# Patient Record
Sex: Male | Born: 1950
Health system: Southern US, Community
[De-identification: ages and names within clinical notes are randomized; demographics above are authoritative.]

## PROBLEM LIST (undated history)

## (undated) DIAGNOSIS — G43909 Migraine, unspecified, not intractable, without status migrainosus: Secondary | ICD-10-CM

## (undated) DIAGNOSIS — K859 Acute pancreatitis without necrosis or infection, unspecified: Secondary | ICD-10-CM

## (undated) DIAGNOSIS — I2510 Atherosclerotic heart disease of native coronary artery without angina pectoris: Secondary | ICD-10-CM

## (undated) DIAGNOSIS — G4730 Sleep apnea, unspecified: Secondary | ICD-10-CM

## (undated) DIAGNOSIS — K2270 Barrett's esophagus without dysplasia: Secondary | ICD-10-CM

## (undated) DIAGNOSIS — N200 Calculus of kidney: Secondary | ICD-10-CM

## (undated) DIAGNOSIS — E785 Hyperlipidemia, unspecified: Secondary | ICD-10-CM

## (undated) DIAGNOSIS — R519 Headache, unspecified: Secondary | ICD-10-CM

## (undated) DIAGNOSIS — R51 Headache: Secondary | ICD-10-CM

## (undated) DIAGNOSIS — G709 Myoneural disorder, unspecified: Secondary | ICD-10-CM

## (undated) DIAGNOSIS — Z87442 Personal history of urinary calculi: Secondary | ICD-10-CM

## (undated) DIAGNOSIS — J189 Pneumonia, unspecified organism: Secondary | ICD-10-CM

## (undated) DIAGNOSIS — N400 Enlarged prostate without lower urinary tract symptoms: Secondary | ICD-10-CM

## (undated) DIAGNOSIS — Z8719 Personal history of other diseases of the digestive system: Secondary | ICD-10-CM

## (undated) DIAGNOSIS — Z832 Family history of diseases of the blood and blood-forming organs and certain disorders involving the immune mechanism: Secondary | ICD-10-CM

## (undated) DIAGNOSIS — C61 Malignant neoplasm of prostate: Secondary | ICD-10-CM

## (undated) DIAGNOSIS — T8859XA Other complications of anesthesia, initial encounter: Secondary | ICD-10-CM

## (undated) DIAGNOSIS — IMO0002 Reserved for concepts with insufficient information to code with codable children: Secondary | ICD-10-CM

## (undated) DIAGNOSIS — I10 Essential (primary) hypertension: Secondary | ICD-10-CM

## (undated) DIAGNOSIS — K219 Gastro-esophageal reflux disease without esophagitis: Secondary | ICD-10-CM

## (undated) DIAGNOSIS — K635 Polyp of colon: Secondary | ICD-10-CM

## (undated) HISTORY — DX: Hyperlipidemia, unspecified: E78.5

## (undated) HISTORY — DX: Headache, unspecified: R51.9

## (undated) HISTORY — DX: Gastro-esophageal reflux disease without esophagitis: K21.9

## (undated) HISTORY — DX: Essential (primary) hypertension: I10

## (undated) HISTORY — PX: PROSTATE BIOPSY: SHX241

## (undated) HISTORY — PX: EYE SURGERY: SHX253

## (undated) HISTORY — DX: Benign prostatic hyperplasia without lower urinary tract symptoms: N40.0

## (undated) HISTORY — DX: Headache: R51

## (undated) HISTORY — DX: Calculus of kidney: N20.0

## (undated) HISTORY — PX: NASAL ENDOSCOPY: SHX6577

## (undated) HISTORY — PX: OTHER SURGICAL HISTORY: SHX169

## (undated) HISTORY — PX: CORONARY ANGIOPLASTY: SHX604

## (undated) HISTORY — DX: Migraine, unspecified, not intractable, without status migrainosus: G43.909

## (undated) HISTORY — DX: Polyp of colon: K63.5

## (undated) HISTORY — DX: Acute pancreatitis without necrosis or infection, unspecified: K85.90

## (undated) HISTORY — DX: Reserved for concepts with insufficient information to code with codable children: IMO0002

---

## 2010-10-05 DIAGNOSIS — E7800 Pure hypercholesterolemia, unspecified: Secondary | ICD-10-CM | POA: Insufficient documentation

## 2010-10-05 DIAGNOSIS — G609 Hereditary and idiopathic neuropathy, unspecified: Secondary | ICD-10-CM | POA: Insufficient documentation

## 2010-10-05 DIAGNOSIS — G4733 Obstructive sleep apnea (adult) (pediatric): Secondary | ICD-10-CM | POA: Diagnosis present

## 2010-10-05 DIAGNOSIS — E559 Vitamin D deficiency, unspecified: Secondary | ICD-10-CM

## 2010-10-05 HISTORY — DX: Vitamin D deficiency, unspecified: E55.9

## 2010-10-05 HISTORY — DX: Hereditary and idiopathic neuropathy, unspecified: G60.9

## 2010-10-05 HISTORY — DX: Pure hypercholesterolemia, unspecified: E78.00

## 2011-07-13 DIAGNOSIS — Z8669 Personal history of other diseases of the nervous system and sense organs: Secondary | ICD-10-CM

## 2011-07-13 HISTORY — DX: Personal history of other diseases of the nervous system and sense organs: Z86.69

## 2011-11-07 DIAGNOSIS — E8881 Metabolic syndrome: Secondary | ICD-10-CM

## 2011-11-07 DIAGNOSIS — R001 Bradycardia, unspecified: Secondary | ICD-10-CM | POA: Insufficient documentation

## 2011-11-07 HISTORY — DX: Bradycardia, unspecified: R00.1

## 2011-11-07 HISTORY — DX: Metabolic syndrome: E88.810

## 2018-04-02 ENCOUNTER — Telehealth: Payer: Self-pay

## 2018-04-02 NOTE — Telephone Encounter (Signed)
Called pt and informed him that Dr. Larose Kells was not accepting a new pts. However, we have 2 providers that are. Pt decided to schedule with Dr. Nani Ravens on 04/20/18.

## 2018-04-02 NOTE — Telephone Encounter (Signed)
Copied from [place] [id number]. Topic: Appointment Scheduling - Scheduling Inquiry for Clinic >> Apr 02, 2018  9:53 AM Rutherford Nail, NT wrote: Reason for CRM: Calling and would like to know if Dr Larose Kells is willing to accept him as a new patient. Covered under [employer].  CB#: [phone]

## 2018-04-02 NOTE — Telephone Encounter (Signed)
Not taking new pts, other providers are

## 2018-04-02 NOTE — Telephone Encounter (Signed)
Please advise 

## 2018-04-02 NOTE — Telephone Encounter (Signed)
Please inform Pt that Dean Guzman is not accepting new patients at this time. Thank you.

## 2018-04-20 ENCOUNTER — Encounter: Payer: Self-pay | Admitting: Family Medicine

## 2018-04-20 ENCOUNTER — Ambulatory Visit (INDEPENDENT_AMBULATORY_CARE_PROVIDER_SITE_OTHER): Payer: Managed Care, Other (non HMO) | Admitting: Family Medicine

## 2018-04-20 VITALS — BP 132/70 | HR 67 | Temp 98.9°F | Ht 73.0 in | Wt 263.2 lb

## 2018-04-20 DIAGNOSIS — R252 Cramp and spasm: Secondary | ICD-10-CM

## 2018-04-20 DIAGNOSIS — R0982 Postnasal drip: Secondary | ICD-10-CM

## 2018-04-20 DIAGNOSIS — E785 Hyperlipidemia, unspecified: Secondary | ICD-10-CM

## 2018-04-20 DIAGNOSIS — R200 Anesthesia of skin: Secondary | ICD-10-CM

## 2018-04-20 DIAGNOSIS — I10 Essential (primary) hypertension: Secondary | ICD-10-CM

## 2018-04-20 HISTORY — DX: Essential (primary) hypertension: I10

## 2018-04-20 HISTORY — DX: Cramp and spasm: R25.2

## 2018-04-20 HISTORY — DX: Anesthesia of skin: R20.0

## 2018-04-20 LAB — BASIC METABOLIC PANEL
BUN: 21 mg/dL (ref 6–23)
CO2: 32 mEq/L (ref 19–32)
Calcium: 9.5 mg/dL (ref 8.4–10.5)
Chloride: 100 mEq/L (ref 96–112)
Creatinine, Ser: 1.06 mg/dL (ref 0.40–1.50)
GFR: 73.93 mL/min (ref >60.00)
Glucose, Bld: 109 mg/dL — ABNORMAL HIGH (ref 70–99)
Potassium: 4.3 mEq/L (ref 3.5–5.1)
Sodium: 140 mEq/L (ref 135–145)

## 2018-04-20 LAB — IBC PANEL
Iron: 74 ug/dL (ref 42–165)
Saturation Ratios: 20.2 % (ref 20.0–50.0)
Transferrin: 262 mg/dL (ref 212.0–360.0)

## 2018-04-20 LAB — MAGNESIUM: Magnesium: 1.8 mg/dL (ref 1.5–2.5)

## 2018-04-20 LAB — FERRITIN: Ferritin: 68.7 ng/mL (ref 22.0–322.0)

## 2018-04-20 LAB — VITAMIN B12: Vitamin B-12: 448 pg/mL (ref 211–911)

## 2018-04-20 MED ORDER — LEVOCETIRIZINE DIHYDROCHLORIDE 5 MG PO TABS: 5.0000 mg | ORAL_TABLET | Freq: Every evening | ORAL | 2 refills | Status: DC

## 2018-04-20 MED ORDER — SIMVASTATIN 20 MG PO TABS: 20.0000 mg | ORAL_TABLET | Freq: Every day | ORAL | 1 refills | Status: DC

## 2018-04-20 NOTE — Progress Notes (Signed)
Pre visit review using our clinic review tool, if applicable. No additional management support is needed unless otherwise documented below in the visit note. 

## 2018-04-20 NOTE — Progress Notes (Signed)
Chief Complaint  Patient presents with  . New Patient (Initial Visit)       New Patient Visit SUBJECTIVE: HPI: Dean Guzman is an 67 y.o.male who is being seen for establishing care.  The patient was previously seen at office in [place].  Hypertension Patient presents for hypertension follow up. He does monitor home blood pressures. Blood pressures ranging on average from 120-130's/70's. He is compliant with medications. Patient has these side effects of medication: none He is adhering to a healthy diet overall. Exercise: walking  Hyperlipidemia Patient presents for dyslipidemia follow up. Currently being treated with simvastatin 20 mg/d and compliance with treatment thus far has been good. He denies myalgias. He is adhering to a healthy. Exercise: Some walking The patient is not known to have coexisting coronary artery disease.  20 yrs of cramping in toes, usually when he goes to bed. Not every night. No weakness, no current issues. He is not having any skin changes. Some labs in past. No hx of Fe def. EMG/NCT showed mild neuropathy. Thyroid, sugar, and lytes normal. Never had Fe studies, B12, or Mg.   Hx of PND. Tx'd for allergies w Claritin, Flonase, and Singulair.  She tried it for 2 months and noticed no difference.  He moved to [place] and noticed he still had this issue.  He is not currently taking anything for it.  Not on File  Past Medical History:  Diagnosis Date  . BPH (benign prostatic hyperplasia)   . Colon polyps   . Frequent headaches   . GERD (gastroesophageal reflux disease)   . Hyperlipidemia   . Hypertension   . Migraines    History reviewed. No pertinent surgical history. Family History  Problem Relation Age of Onset  . Arthritis Mother   . Heart disease Father   . Hyperlipidemia Father   . Hypertension Father   . Cancer Sister        breast cancer  . Diabetes Maternal Grandmother   . Hearing loss Paternal Grandmother   . Early death  Paternal Grandmother   . Hearing loss Paternal Grandfather   . Early death Paternal Grandfather     Current Outpatient Medications:  .  aspirin EC 81 MG tablet, Take 81 mg by mouth daily., Disp: , Rfl:  .  cholecalciferol (VITAMIN D3) 25 MCG (1000 UT) tablet, Take 1,000 Units by mouth daily., Disp: , Rfl:  .  diltiazem (DILACOR XR) 240 MG 24 hr capsule, Take 240 mg by mouth daily., Disp: , Rfl:  .  hydrochlorothiazide (MICROZIDE) 12.5 MG capsule, Take 12.5 mg by mouth daily., Disp: , Rfl:  .  ipratropium (ATROVENT) 0.06 % nasal spray, Place 1 spray into both nostrils 2 (two) times daily., Disp: , Rfl:  .  lansoprazole (PREVACID) 15 MG capsule, Take 15 mg by mouth daily at 12 noon., Disp: , Rfl:  .  levocetirizine (XYZAL) 5 MG tablet, Take 1 tablet (5 mg total) by mouth every evening., Disp: 30 tablet, Rfl: 2 .  lisinopril (PRINIVIL,ZESTRIL) 10 MG tablet, Take 10 mg by mouth daily., Disp: , Rfl:  .  Multiple Vitamins-Minerals (CENTRUM SILVER PO), Take by mouth., Disp: , Rfl:  .  Omega-3 Fatty Acids (FISH OIL) 1200 MG CAPS, Take 1 capsule by mouth 2 (two) times daily., Disp: , Rfl:  .  simvastatin (ZOCOR) 20 MG tablet, Take 1 tablet (20 mg total) by mouth daily., Disp: 90 tablet, Rfl: 1 .  tadalafil (CIALIS) 5 MG tablet, Take 5 mg by mouth  daily as needed for erectile dysfunction., Disp: , Rfl:    ROS Cardiovascular: Denies chest pain  Respiratory: Denies dyspnea   OBJECTIVE: BP 132/70 (BP Location: Left Arm, Patient Position: Sitting, Cuff Size: Normal)   Pulse 67   Temp 98.9 F (37.2 C) (Oral)   Ht 6\' 1"  (1.854 m)   Wt 263 lb 4 oz (119.4 kg)   SpO2 97%   BMI 34.73 kg/m   Constitutional: -  VS reviewed -  Well developed, well nourished, appears stated age -  No apparent distress  Psychiatric: -  Oriented to person, place, and time -  Memory intact -  Affect and mood normal -  Fluent conversation, good eye contact -  Judgment and insight age appropriate  Eye: -  Conjunctivae  clear, no discharge -  Pupils symmetric, round, reactive to light  ENMT: -  MMM    Pharynx moist, no exudate, no erythema  Neck: -  No gross swelling, no palpable masses -  Thyroid midline, not enlarged, mobile, no palpable masses  Cardiovascular: -  RRR -  No LE edema  Respiratory: -  Normal respiratory effort, no accessory muscle use, no retraction -  Breath sounds equal, no wheezes, no ronchi, no crackles  Neurological:  -  CN II - XII grossly intact -Decreased sensation over the digits of the feet bilaterally to pinprick  Musculoskeletal: -  No clubbing, no cyanosis -  Gait normal  Skin: -  No significant lesion on inspection -  Warm and dry to palpation   ASSESSMENT/PLAN: Essential hypertension - Plan: hydrochlorothiazide (MICROZIDE) 12.5 MG capsule, lisinopril (PRINIVIL,ZESTRIL) 10 MG tablet  Hyperlipidemia, unspecified hyperlipidemia type - Plan: simvastatin (ZOCOR) 20 MG tablet  Post-nasal drainage - Plan: levocetirizine (XYZAL) 5 MG tablet  Numbness of toes - Plan: B12  Cramping of feet - Plan: Basic metabolic panel, Ferritin, IBC panel, Magnesium  Patient instructed to sign release of records form from his previous PCP. For a #1 and #2, continue current medications For #3, will try Xyzal.  If no improvement, will refer to allergy. We will check labs for a #4 #5.  If no abnormalities, will start Effexor. Patient should return in 6 weeks. The patient voiced understanding and agreement to the plan.   [place], DO 04/20/18  4:20 PM

## 2018-04-20 NOTE — Patient Instructions (Addendum)
Because your blood pressure is well-controlled, you no longer have to check your blood pressure at home anymore unless you wish. Some people check it twice daily every day and some people stop altogether. Either or anything in between is fine. Strong work!  Give [place] 2-3 business days to get the results of your labs back.   If labs are normal, I will be calling in a medicine.   Let us know if you need anything.

## 2018-04-22 ENCOUNTER — Other Ambulatory Visit: Payer: Self-pay | Admitting: Family Medicine

## 2018-04-22 MED ORDER — VENLAFAXINE HCL ER 37.5 MG PO CP24: 37.5000 mg | ORAL_CAPSULE | Freq: Every day | ORAL | 2 refills | Status: DC

## 2018-05-15 ENCOUNTER — Encounter: Payer: Self-pay | Admitting: Family Medicine

## 2018-05-16 MED ORDER — IPRATROPIUM BROMIDE 0.06 % NA SOLN: 1.0000 | Freq: Two times a day (BID) | NASAL | 5 refills | Status: DC

## 2018-06-04 ENCOUNTER — Ambulatory Visit: Payer: Managed Care, Other (non HMO) | Admitting: Family Medicine

## 2018-06-07 ENCOUNTER — Ambulatory Visit (INDEPENDENT_AMBULATORY_CARE_PROVIDER_SITE_OTHER): Payer: Managed Care, Other (non HMO) | Admitting: Family Medicine

## 2018-06-07 ENCOUNTER — Encounter: Payer: Self-pay | Admitting: Family Medicine

## 2018-06-07 VITALS — BP 130/80 | HR 62 | Temp 98.0°F | Ht 73.0 in | Wt 261.4 lb

## 2018-06-07 DIAGNOSIS — R252 Cramp and spasm: Secondary | ICD-10-CM

## 2018-06-07 DIAGNOSIS — J392 Other diseases of pharynx: Secondary | ICD-10-CM

## 2018-06-07 NOTE — Progress Notes (Signed)
Chief Complaint  Patient presents with  . Follow-up    Subjective: Patient is a 68 y.o. male here for f/u.  Throat irritation did not improve on the Xyzal. Has failed INCS and Singulair in past also.   Foot cramping takes place at least once every other week. Lasts for 1-2 hrs despite stretching area. No apparent trigger for those. Happens at night. EMG/NCT showed mild neuropathy around 10-15 yrs ago. SNRI was unhelpful. Labs unremarkable. This started before going on ACEi and HCTZ.    ROS: Heart: Denies chest pain  Lungs: Denies SOB   Past Medical History:  Diagnosis Date  . BPH (benign prostatic hyperplasia)   . Colon polyps   . Frequent headaches   . GERD (gastroesophageal reflux disease)   . Hyperlipidemia   . Hypertension   . Migraines     Objective: BP 130/80 (BP Location: Left Arm, Patient Position: Sitting, Cuff Size: Normal)   Pulse 62   Temp 98 F (36.7 C) (Oral)   Ht 6\' 1"  (1.854 m)   Wt 261 lb 6 oz (118.6 kg)   SpO2 96%   BMI 34.48 kg/m  General: Awake, appears stated age HEENT: MMM, EOMi Heart: RRR, no murmurs Lungs: CTAB, no rales, wheezes or rhonchi. No accessory muscle use Feet: Range of motion of toes normal, no tenderness to palpation of the plantar surface of the foot, no contractures or spasms noted Neuro: Sensation decreased to pinprick over the great toes bilaterally, intact over the balls of the feet and heels Psych: Age appropriate judgment and insight, normal affect and mood  Assessment and Plan: Throat irritation - Plan: Ambulatory referral to ENT  Cramping of feet  #1-okay to stop Xyzal, refer to ENT for further management.  I am hoping they will perform nasolaryngoscopy to visualize his vocal cords.  Follow-up pending that evaluation.  Continue Prevacid. #2-stay hydrated, these episodes seem intermittent and very atypical.  I would like to see how pickle juice does is in addition to supplementing with magnesium.  If these are not  helpful, I would like him to be evaluated by the neurology team. Follow-up in 6 months for medication review. The patient voiced understanding and agreement to the plan.  [place], DO 06/07/18  5:04 PM

## 2018-06-07 NOTE — Progress Notes (Signed)
Pre visit review using our clinic review tool, if applicable. No additional management support is needed unless otherwise documented below in the visit note. 

## 2018-06-07 NOTE — Patient Instructions (Addendum)
If you do not hear anything about your referral in the next 1-2 weeks, call our office and ask for an update.  Consider a spoonful of pickle juice nightly for 1 week. You could also take 250-400 mg of magnesium daily to help with the cramps. If this doesn't get better over the next couple weeks with the magnesium and pickle juice, send me a MyChart message.  Let us know if you need anything.

## 2018-06-14 DIAGNOSIS — R059 Cough, unspecified: Secondary | ICD-10-CM | POA: Insufficient documentation

## 2018-06-14 DIAGNOSIS — J392 Other diseases of pharynx: Secondary | ICD-10-CM | POA: Insufficient documentation

## 2018-07-02 ENCOUNTER — Encounter: Payer: Self-pay | Admitting: Family Medicine

## 2018-07-05 ENCOUNTER — Encounter: Payer: Self-pay | Admitting: Family Medicine

## 2018-07-05 MED ORDER — DILTIAZEM HCL ER 240 MG PO CP24: 240.0000 mg | ORAL_CAPSULE | Freq: Every day | ORAL | 1 refills | Status: DC

## 2018-07-11 ENCOUNTER — Encounter: Payer: Self-pay | Admitting: Family Medicine

## 2018-07-12 ENCOUNTER — Other Ambulatory Visit: Payer: Self-pay | Admitting: Family Medicine

## 2018-07-12 DIAGNOSIS — E785 Hyperlipidemia, unspecified: Secondary | ICD-10-CM

## 2018-07-24 ENCOUNTER — Encounter: Payer: Self-pay | Admitting: Family Medicine

## 2018-07-24 ENCOUNTER — Telehealth: Payer: Self-pay | Admitting: *Deleted

## 2018-07-24 NOTE — Telephone Encounter (Signed)
Received Endoscopy Results from [facility]; forwarded to provider/SLS 02/25

## 2018-07-25 ENCOUNTER — Other Ambulatory Visit: Payer: Self-pay | Admitting: Family Medicine

## 2018-07-25 MED ORDER — SIMVASTATIN 10 MG PO TABS: 10.0000 mg | ORAL_TABLET | Freq: Every day | ORAL | 3 refills | Status: DC

## 2018-07-31 ENCOUNTER — Encounter: Payer: Self-pay | Admitting: Family Medicine

## 2018-07-31 ENCOUNTER — Telehealth: Payer: Self-pay | Admitting: Family Medicine

## 2018-07-31 ENCOUNTER — Ambulatory Visit (INDEPENDENT_AMBULATORY_CARE_PROVIDER_SITE_OTHER): Payer: Managed Care, Other (non HMO) | Admitting: Family Medicine

## 2018-07-31 VITALS — BP 122/76 | HR 88 | Temp 98.0°F | Resp 16 | Ht 73.0 in | Wt 259.5 lb

## 2018-07-31 DIAGNOSIS — R1084 Generalized abdominal pain: Secondary | ICD-10-CM | POA: Diagnosis not present

## 2018-07-31 DIAGNOSIS — R1031 Right lower quadrant pain: Secondary | ICD-10-CM | POA: Diagnosis not present

## 2018-07-31 DIAGNOSIS — R109 Unspecified abdominal pain: Secondary | ICD-10-CM

## 2018-07-31 LAB — POCT URINALYSIS DIPSTICK
Bilirubin, UA: NEGATIVE
Blood, UA: NEGATIVE
Glucose, UA: NEGATIVE
Ketones, UA: 5
Leukocytes, UA: NEGATIVE
Nitrite, UA: NEGATIVE
Protein, UA: NEGATIVE
Spec Grav, UA: 1.015 (ref 1.010–1.025)
Urobilinogen, UA: NEGATIVE E.U./dL — AB
pH, UA: 6 (ref 5.0–8.0)

## 2018-07-31 MED ORDER — CIPROFLOXACIN HCL 500 MG PO TABS: 500.0000 mg | ORAL_TABLET | Freq: Two times a day (BID) | ORAL | 0 refills | Status: DC

## 2018-07-31 MED ORDER — METRONIDAZOLE 500 MG PO TABS: 500.0000 mg | ORAL_TABLET | Freq: Three times a day (TID) | ORAL | 0 refills | Status: DC

## 2018-07-31 MED ORDER — TRAMADOL HCL 50 MG PO TABS: 50.0000 mg | ORAL_TABLET | Freq: Three times a day (TID) | ORAL | 0 refills | Status: DC | PRN

## 2018-07-31 NOTE — Telephone Encounter (Signed)
Spoke with patient to let him know.  Stated verbal understanding.

## 2018-07-31 NOTE — Telephone Encounter (Signed)
Please inform patient the following information: I have called in 2 abx to cover for diverticulitis or colitis. He should start this today.  We are still attempting to get his CT cleared by his insurance.

## 2018-07-31 NOTE — Patient Instructions (Signed)
Tramadol  Called in for pain if needed.  Start a stool softener, such as Miralax (1/2-1 cap in water.  If pain worsens, nausea, vomit or fever go to ED.   I am concerned you have either appendicitis or colitis.

## 2018-07-31 NOTE — Progress Notes (Signed)
error 

## 2018-07-31 NOTE — Progress Notes (Signed)
Dean Guzman , [birth year], 68 y.o., male MRN: [id number] Patient Care Team    Relationship Specialty Notifications Start End  [place], [place], [place] PCP - General Family Medicine  04/20/18     Chief Complaint  Patient presents with  . Abdominal Pain    Pt has RL abd pain since last night that comes across stomach. HX of kidney stones.      Subjective: Pt presents for an OV with complaints of abdominal pain  of 1 day duration started in his right lower quadrant and radiates across his abdomen.   Associated symptoms include no appetite, decreased bowel movement. He reports the pain kept him up the majority of the night. He has never experienced anything like this in the past. He had kidney stones twice and this feels worse. He had an EGD completed 2 weeks ago and reports  barrett's esophagus and hiatal hernia- present with a normal biopsy. No records available. He reports he was started on dexilant. He denies fever or chills but felt clammy today. His bowel movements have been routine daily until today he only had a very small production of stool- but denies hard stool, blood or mucous. He tried to go to work today and had to go home from pain. He reports colonoscopy completed about 8 years ago and normal.- no diverticulosis he is aware of. He denies dysuria. He has only drank a very small amount of water coffee today. He has not eaten. He denies nausea or vomit.  Pt has tried nothing to ease their symptoms.   No flowsheet data found.  No Known Allergies Social History   Social History Narrative  . Not on file   Past Medical History:  Diagnosis Date  . BPH (benign prostatic hyperplasia)   . Colon polyps   . Frequent headaches   . GERD (gastroesophageal reflux disease)   . Hyperlipidemia   . Hypertension   . Kidney stones   . Migraines    No past surgical history on file. Family History  Problem Relation Age of Onset  . Arthritis Mother   . Heart disease Father   .  Hyperlipidemia Father   . Hypertension Father   . Cancer Sister        breast cancer  . Diabetes Maternal Grandmother   . Hearing loss Paternal Grandmother   . Early death Paternal Grandmother   . Hearing loss Paternal Grandfather   . Early death Paternal Grandfather    Allergies as of 07/31/2018   No Known Allergies     Medication List       Accurate as of July 31, 2018  4:56 PM. Always use your most recent med list.        aspirin EC 81 MG tablet Take 81 mg by mouth daily.   CENTRUM SILVER PO Take by mouth.   cholecalciferol 25 MCG (1000 UT) tablet Commonly known as:  VITAMIN D3 Take 1,000 Units by mouth daily.   ciprofloxacin 500 MG tablet Commonly known as:  CIPRO Take 1 tablet (500 mg total) by mouth 2 (two) times daily.   DEXILANT 60 MG capsule Generic drug:  dexlansoprazole Take 60 mg by mouth daily.   diltiazem 240 MG 24 hr capsule Commonly known as:  DILACOR XR Take 1 capsule (240 mg total) by mouth daily.   Fish Oil 1200 MG Caps Take 1 capsule by mouth 2 (two) times daily.   hydrochlorothiazide 12.5 MG capsule Commonly known as:  MICROZIDE Take 12.5  mg by mouth daily.   ipratropium 0.06 % nasal spray Commonly known as:  ATROVENT Place 1 spray into both nostrils 2 (two) times daily.   lansoprazole 15 MG capsule Commonly known as:  PREVACID Take 15 mg by mouth daily at 12 noon.   lisinopril 10 MG tablet Commonly known as:  PRINIVIL,ZESTRIL Take 10 mg by mouth daily.   metroNIDAZOLE 500 MG tablet Commonly known as:  FLAGYL Take 1 tablet (500 mg total) by mouth 3 (three) times daily.   simvastatin 10 MG tablet Commonly known as:  ZOCOR Take 1 tablet (10 mg total) by mouth at bedtime.   tadalafil 5 MG tablet Commonly known as:  CIALIS Take 5 mg by mouth daily as needed for erectile dysfunction.   traMADol 50 MG tablet Commonly known as:  ULTRAM Take 1 tablet (50 mg total) by mouth every 8 (eight) hours as needed.       All past  medical history, surgical history, allergies, family history, immunizations andmedications were updated in the EMR today and reviewed under the history and medication portions of their EMR.     ROS: Negative, with the exception of above mentioned in HPI   Objective:  BP 122/76 (BP Location: Left Arm, Patient Position: Sitting, Cuff Size: Normal)   Pulse 88   Temp 98 F (36.7 C) (Oral)   Resp 16   Ht '6\' 1"'  (1.854 m)   Wt 259 lb 8 oz (117.7 kg)   SpO2 95%   BMI 34.24 kg/m  Body mass index is 34.24 kg/m. Gen: Afebrile. N. Appears uncomfortable,  well developed, well nourished. Obese caucasian male.  HENT: AT. [place]. Mild dry mucous membranes Eyes:Pupils Equal Round Reactive to light, Extraocular movements intact,  Conjunctiva without redness, discharge or icterus. Neck/lymp/endocrine: Supple,no lymphadenopathy CV: RRR no murmur, no edema Chest: CTAB, no wheeze or crackles. Good air movement, normal resp effort.  Abd: RLQ moderately hard and moderate to severe tender over area from midline suprapubic-RLQ and right flank. Obese. BS present. +mcburneys pain, +psoas sign. Mild rebound and guarding present.  Skin: no rashes, purpura or petechiae.  Neuro: Normal gait. PERLA. EOMi. Alert. Oriented x3   No exam data present No results found. Results for orders placed or performed in visit on 07/31/18 (from the past 24 hour(s))  POCT urinalysis dipstick     Status: Abnormal   Collection Time: 07/31/18  4:02 PM  Result Value Ref Range   Color, UA Yellow    Clarity, UA clear    Glucose, UA Negative Negative   Bilirubin, UA neg    Ketones, UA 5    Spec Grav, UA 1.015 1.010 - 1.025   Blood, UA neg    pH, UA 6.0 5.0 - 8.0   Protein, UA Negative Negative   Urobilinogen, UA negative (A) 0.2 or 1.0 E.U./dL   Nitrite, UA neg    Leukocytes, UA Negative Negative   Appearance clear    Odor none     Assessment/Plan: Demeco Ducksworth is a 68 y.o. male present for OV for  Acute Right lower  quadrant abdominal pain/Generalized abdominal pain - moderate to severe RLQ- flank and suprapubic pain on exam. Concern for colitis vs appendicitis. Right side of abdomen is moderately hard and severely tender  - pt was provided with cipro/flagyl to cover diverticulitis or possible colitis.  - CBC, CRP and CMP collected today. - tramadol for pain. Start miralax. Hydrate! - he declined zofran.  - CT abd/pelvis with contrast ordered STAT.  Pt was advised not to wait on CT -->  if pain worsens, fever, nausea or vomit he is to report straight to the ED. He reports understanding.  - POCT urinalysis dipstick--> trace ketones only . - CBC w/Diff - Comp Met (CMET) - C-reactive protein - CT Abdomen Pelvis W Contrast; Future - traMADol (ULTRAM) 50 MG tablet; Take 1 tablet (50 mg total) by mouth every 8 (eight) hours as needed.  Dispense: 15 tablet; Refill: 0 - ciprofloxacin (CIPRO) 500 MG tablet; Take 1 tablet (500 mg total) by mouth 2 (two) times daily.  Dispense: 20 tablet; Refill: 0 - metroNIDAZOLE (FLAGYL) 500 MG tablet; Take 1 tablet (500 mg total) by mouth 3 (three) times daily.  Dispense: 30 tablet; Refill: 0 - CT Abdomen Pelvis W Contrast; Future - f/u dependent on CT.    Reviewed expectations re: course of current medical issues.  Discussed self-management of symptoms.  Outlined signs and symptoms indicating need for more acute intervention.  Patient verbalized understanding and all questions were answered.  Patient received an After-Visit Summary.    Orders Placed This Encounter  Procedures  . CT Abdomen Pelvis W Contrast  . CBC w/Diff  . Comp Met (CMET)  . C-reactive protein  . POCT urinalysis dipstick     Note is dictated utilizing voice recognition software. Although note has been proof read prior to signing, occasional typographical errors still can be missed. If any questions arise, please do not hesitate to call for verification.   electronically signed by:  Howard Pouch, DO  [place]

## 2018-08-01 ENCOUNTER — Telehealth: Payer: Self-pay | Admitting: Family Medicine

## 2018-08-01 ENCOUNTER — Ambulatory Visit (INDEPENDENT_AMBULATORY_CARE_PROVIDER_SITE_OTHER): Payer: Managed Care, Other (non HMO)

## 2018-08-01 DIAGNOSIS — R188 Other ascites: Secondary | ICD-10-CM

## 2018-08-01 DIAGNOSIS — R1031 Right lower quadrant pain: Secondary | ICD-10-CM

## 2018-08-01 DIAGNOSIS — K529 Noninfective gastroenteritis and colitis, unspecified: Secondary | ICD-10-CM

## 2018-08-01 DIAGNOSIS — R1084 Generalized abdominal pain: Secondary | ICD-10-CM

## 2018-08-01 HISTORY — DX: Noninfective gastroenteritis and colitis, unspecified: K52.9

## 2018-08-01 LAB — CBC WITH DIFFERENTIAL/PLATELET
Absolute Monocytes: 757 cells/uL (ref 200–950)
Basophils Absolute: 27 cells/uL (ref 0–200)
Basophils Relative: 0.3 %
Eosinophils Absolute: 160 cells/uL (ref 15–500)
Eosinophils Relative: 1.8 %
HCT: 47.8 % (ref 38.5–50.0)
Hemoglobin: 16.5 g/dL (ref 13.2–17.1)
Lymphs Abs: 1210 cells/uL (ref 850–3900)
MCH: 30.3 pg (ref 27.0–33.0)
MCHC: 34.5 g/dL (ref 32.0–36.0)
MCV: 87.7 fL (ref 80.0–100.0)
MPV: 11.9 fL (ref 7.5–12.5)
Monocytes Relative: 8.5 %
Neutro Abs: 6746 cells/uL (ref 1500–7800)
Neutrophils Relative %: 75.8 %
Platelets: 228 10*3/uL (ref 140–400)
RBC: 5.45 10*6/uL (ref 4.20–5.80)
RDW: 13.3 % (ref 11.0–15.0)
Total Lymphocyte: 13.6 %
WBC: 8.9 10*3/uL (ref 3.8–10.8)

## 2018-08-01 LAB — COMPREHENSIVE METABOLIC PANEL
AG Ratio: 1.8 (calc) (ref 1.0–2.5)
ALT: 24 U/L (ref 9–46)
AST: 19 U/L (ref 10–35)
Albumin: 4.6 g/dL (ref 3.6–5.1)
Alkaline phosphatase (APISO): 66 U/L (ref 35–144)
BUN: 18 mg/dL (ref 7–25)
CO2: 28 mmol/L (ref 20–32)
Calcium: 10.1 mg/dL (ref 8.6–10.3)
Chloride: 101 mmol/L (ref 98–110)
Creat: 1.15 mg/dL (ref 0.70–1.25)
Globulin: 2.5 g/dL (calc) (ref 1.9–3.7)
Glucose, Bld: 108 mg/dL — ABNORMAL HIGH (ref 65–99)
Potassium: 4.6 mmol/L (ref 3.5–5.3)
Sodium: 141 mmol/L (ref 135–146)
Total Bilirubin: 0.7 mg/dL (ref 0.2–1.2)
Total Protein: 7.1 g/dL (ref 6.1–8.1)

## 2018-08-01 LAB — C-REACTIVE PROTEIN: CRP: 19.3 mg/L — ABNORMAL HIGH (ref <8.0)

## 2018-08-01 MED ORDER — IOPAMIDOL (ISOVUE-300) INJECTION 61%
100.0000 mL | Freq: Once | INTRAVENOUS | Status: AC | PRN
  Administered 2018-08-01: 100 mL via INTRAVENOUS

## 2018-08-01 NOTE — Telephone Encounter (Signed)
Pt was called and GI appt was set up for tomorrow. Pt verbalized understanding.

## 2018-08-01 NOTE — Telephone Encounter (Signed)
Copied from [place] [id number]. Topic: Quick Communication - See Telephone Encounter >> Aug 01, 2018  3:38 PM Bea Graff, NT wrote: CRM for notification. See Telephone encounter for: 08/01/18. Pt requesting a call from a nurse with his CT result from today.

## 2018-08-01 NOTE — Telephone Encounter (Signed)
Instructions for CT scan and address were written down and placed with Barium Sulfate.

## 2018-08-01 NOTE — Telephone Encounter (Signed)
Called and spoke with pt who states he still has pain and has not been able to eat but was able to sleep some last night. Lab results given to patient. Pts wife is going to stop and pick up Barium sulfate. Instructions and directions given to patient and he verbalized understanding.

## 2018-08-01 NOTE — Telephone Encounter (Signed)
Called pt to discuss CT findings.  - he still has a dull pain in his lower right abdomen. He has not tried solid foods, but has been drinking water. He has had a BM yesterday after he was seen that was diarrhea. He did sleep better last night. He has not developed a fever, nausea or vomit.  His CT showed multiple loops of thickened, inflamed appearing colon on the right side. These findings are consistent with nonspecific infectious, inflammatory, or ischemic enteritis, and this pattern can be seen in inflammatory bowel disease such as Crohn's. Pt was advised of findings and told to Continue antibiotics. Continue liquid diet (broth-jello etc) for another 24-48 hours--then advance to soft bland diet.  I have referred him back to his GI Dr. Michail Sermon to be seen ASAP to follow up on these findings. They should get him in quickly. If they can not  get him to be seen  Within a week- he should followup with his primary for recheck on his abdomen 3-7 days weeks.  If he becomes worse, fever, nausea, vomit or bowel changes blood , mucous etc - including lack of BM or gas --> is emergent and would need to go to ED.   Pt reports understanding of the above and was given opportunity to ask questions.

## 2018-08-01 NOTE — Telephone Encounter (Signed)
Please call pt: His inflammatory marker is rather significantly elevated suggesting an inflammatory process causing his symptoms such as colitis or appendicitis etc.  Please make sure is taking the abx prescribed. Would suggest a liquid diet for now and he has a CT appt at 2 pm in [place]. Make sure is aware to show early and location etc.    If pain or symptoms are worsening prior to his appt or before he hears back from [place] on the results go to ED.  Please ask how he is feeling today?

## 2018-08-01 NOTE — Telephone Encounter (Signed)
Patient states he needs to have his CT Scan results and any records from Dr Raoul Pitch sent over to Dr Michail Sermon before his appointment tomorrow

## 2018-08-01 NOTE — Telephone Encounter (Signed)
Noted  

## 2018-08-02 NOTE — Telephone Encounter (Signed)
I have faxed the results and office visit notes. The physician's nurse pulled the CT and lab results from [place] as well.

## 2018-08-07 LAB — HM COLONOSCOPY

## 2018-08-08 ENCOUNTER — Encounter: Payer: Self-pay | Admitting: Family Medicine

## 2018-08-29 ENCOUNTER — Telehealth: Payer: Self-pay | Admitting: *Deleted

## 2018-08-29 NOTE — Telephone Encounter (Signed)
Received Medical records from [facility]; forwarded to provider/SLS 04/01

## 2018-08-30 ENCOUNTER — Encounter: Payer: Self-pay | Admitting: Family Medicine

## 2018-09-04 ENCOUNTER — Encounter: Payer: Self-pay | Admitting: Family Medicine

## 2018-09-04 DIAGNOSIS — I10 Essential (primary) hypertension: Secondary | ICD-10-CM

## 2018-09-05 MED ORDER — LISINOPRIL 10 MG PO TABS: 10.0000 mg | ORAL_TABLET | Freq: Every day | ORAL | 0 refills | Status: DC

## 2018-10-09 ENCOUNTER — Encounter: Payer: Self-pay | Admitting: Family Medicine

## 2018-10-09 DIAGNOSIS — I10 Essential (primary) hypertension: Secondary | ICD-10-CM

## 2018-10-09 MED ORDER — HYDROCHLOROTHIAZIDE 12.5 MG PO CAPS: 12.5000 mg | ORAL_CAPSULE | Freq: Every day | ORAL | 2 refills | Status: DC

## 2018-11-04 ENCOUNTER — Encounter: Payer: Self-pay | Admitting: Family Medicine

## 2018-11-04 ENCOUNTER — Other Ambulatory Visit: Payer: Self-pay | Admitting: Family Medicine

## 2018-11-05 MED ORDER — SIMVASTATIN 10 MG PO TABS: ORAL_TABLET | ORAL | 1 refills | Status: DC

## 2018-11-28 ENCOUNTER — Other Ambulatory Visit: Payer: Self-pay | Admitting: Family Medicine

## 2018-11-28 DIAGNOSIS — I10 Essential (primary) hypertension: Secondary | ICD-10-CM

## 2018-11-28 MED ORDER — LISINOPRIL 10 MG PO TABS: 10.0000 mg | ORAL_TABLET | Freq: Every day | ORAL | 0 refills | Status: DC

## 2018-11-29 ENCOUNTER — Encounter: Payer: Self-pay | Admitting: Family Medicine

## 2018-11-29 DIAGNOSIS — I10 Essential (primary) hypertension: Secondary | ICD-10-CM

## 2018-12-05 ENCOUNTER — Encounter: Payer: Self-pay | Admitting: Family Medicine

## 2018-12-06 ENCOUNTER — Ambulatory Visit: Payer: Managed Care, Other (non HMO) | Admitting: Family Medicine

## 2018-12-11 ENCOUNTER — Other Ambulatory Visit: Payer: Self-pay

## 2018-12-11 ENCOUNTER — Encounter: Payer: Self-pay | Admitting: Family Medicine

## 2018-12-11 ENCOUNTER — Ambulatory Visit (INDEPENDENT_AMBULATORY_CARE_PROVIDER_SITE_OTHER): Payer: Managed Care, Other (non HMO) | Admitting: Family Medicine

## 2018-12-11 VITALS — BP 130/82 | HR 66 | Temp 98.7°F | Ht 73.0 in | Wt 248.0 lb

## 2018-12-11 DIAGNOSIS — R238 Other skin changes: Secondary | ICD-10-CM

## 2018-12-11 DIAGNOSIS — Z23 Encounter for immunization: Secondary | ICD-10-CM | POA: Diagnosis not present

## 2018-12-11 DIAGNOSIS — I10 Essential (primary) hypertension: Secondary | ICD-10-CM | POA: Diagnosis not present

## 2018-12-11 DIAGNOSIS — E785 Hyperlipidemia, unspecified: Secondary | ICD-10-CM | POA: Diagnosis not present

## 2018-12-11 DIAGNOSIS — R0982 Postnasal drip: Secondary | ICD-10-CM

## 2018-12-11 LAB — COMPREHENSIVE METABOLIC PANEL
ALT: 19 U/L (ref 0–53)
AST: 17 U/L (ref 0–37)
Albumin: 4.4 g/dL (ref 3.5–5.2)
Alkaline Phosphatase: 60 U/L (ref 39–117)
BUN: 19 mg/dL (ref 6–23)
CO2: 29 mEq/L (ref 19–32)
Calcium: 9.3 mg/dL (ref 8.4–10.5)
Chloride: 99 mEq/L (ref 96–112)
Creatinine, Ser: 1.22 mg/dL (ref 0.40–1.50)
GFR: 59.03 mL/min — ABNORMAL LOW (ref >60.00)
Glucose, Bld: 80 mg/dL (ref 70–99)
Potassium: 4.3 mEq/L (ref 3.5–5.1)
Sodium: 137 mEq/L (ref 135–145)
Total Bilirubin: 0.7 mg/dL (ref 0.2–1.2)
Total Protein: 6.7 g/dL (ref 6.0–8.3)

## 2018-12-11 LAB — LIPID PANEL
Cholesterol: 180 mg/dL (ref 0–200)
HDL: 37.3 mg/dL — ABNORMAL LOW (ref >39.00)
LDL Cholesterol: 110 mg/dL — ABNORMAL HIGH (ref 0–99)
NonHDL: 142.76
Total CHOL/HDL Ratio: 5
Triglycerides: 164 mg/dL — ABNORMAL HIGH (ref 0.0–149.0)
VLDL: 32.8 mg/dL (ref 0.0–40.0)

## 2018-12-11 MED ORDER — CLOBETASOL PROPIONATE 0.05 % EX SHAM: MEDICATED_SHAMPOO | CUTANEOUS | 0 refills | Status: DC

## 2018-12-11 NOTE — Addendum Note (Signed)
Addended by: Sharon Seller B on: 12/11/2018 01:38 PM   Modules accepted: Orders

## 2018-12-11 NOTE — Progress Notes (Signed)
Chief Complaint  Patient presents with  . Follow-up  . Hair/Scalp Problem    dry and itches  . Referral    allergist    Subjective Dean Guzman is a 68 y.o. male who presents for hypertension follow up. He does monitor home blood pressures. Blood pressures ranging from 130's/70-80's on average. He is compliant with medications- HCTZ 12.5 mg/d, cardizem 240 mg/d, lisinopril 10 mg/d. Patient has these side effects of medication: none He is adhering to a healthy diet overall. Current exercise: walking  Hyperlipidemia Patient presents for dyslipidemia follow up. Currently being treated with Zocor 10 mg/d and compliance with treatment thus far has been good. He denies myalgias. He is adhering to a healthy diet. Exercise: walking The patient is not known to have coexisting coronary artery disease.  Scalp has been itching over past several weeks. Worse after he works. No scaling or dandruff. No pain, new topicals. Has not tried anything at home. Not bad now.  Continues to have post nasal drainage. Has seen ENT, GI without relief. Would like to see allergist. Using Astelin nasal spray w some relief of rhinorrhea.     Past Medical History:  Diagnosis Date  . BPH (benign prostatic hyperplasia)   . Colon polyps   . Frequent headaches   . GERD (gastroesophageal reflux disease)   . Hyperlipidemia   . Hypertension   . Kidney stones   . Migraines     Review of Systems Cardiovascular: no chest pain Respiratory:  no shortness of breath  Exam BP 130/82 (BP Location: Left Arm, Patient Position: Sitting, Cuff Size: Normal)   Pulse 66   Temp 98.7 F (37.1 C) (Oral)   Ht 6\' 1"  (1.854 m)   Wt 248 lb (112.5 kg)   SpO2 98%   BMI 32.72 kg/m  General:  well developed, well nourished, in no apparent distress Heart: RRR, no bruits, no LE edema Lungs: clear to auscultation, no accessory muscle use Skin: No scaling, rashes or erythema on scalp noted Psych: well oriented with normal  range of affect and appropriate judgment/insight  Essential hypertension - Plan: Comprehensive metabolic panel; cont meds, OK to stop checking BP at home  Post-nasal drainage - Plan: Ambulatory referral to Allergy, per request  Scalp irritation - Plan: trial steroid shampoo  Hyperlipidemia, unspecified hyperlipidemia type - Plan: Lipid panel, cont statin  Orders as above. Counseled on diet and exercise. F/u in 6 mo for CPE or prn. The patient voiced understanding and agreement to the plan.  [place], DO 12/11/18  1:34 PM

## 2018-12-11 NOTE — Patient Instructions (Addendum)
Keep the diet clean and stay active.  Give [place] 2-3 business days to get the results of your labs back.   Because your blood pressure is well-controlled, you no longer have to check your blood pressure at home anymore unless you wish. Some people check it twice daily every day and some people stop altogether. Either or anything in between is fine. Strong work!  Let me know if anything changes with your scalp.  If you do not hear anything about your referral in the next 1-2 weeks, call our office and ask for an update.  Let us know if you need anything.

## 2018-12-25 ENCOUNTER — Ambulatory Visit: Payer: Managed Care, Other (non HMO) | Admitting: Allergy and Immunology

## 2018-12-25 ENCOUNTER — Other Ambulatory Visit: Payer: Self-pay | Admitting: Family Medicine

## 2018-12-25 ENCOUNTER — Other Ambulatory Visit: Payer: Self-pay

## 2018-12-25 ENCOUNTER — Encounter: Payer: Self-pay | Admitting: Allergy and Immunology

## 2018-12-25 VITALS — BP 152/70 | HR 67 | Temp 98.3°F | Resp 16 | Ht 73.39 in | Wt 247.4 lb

## 2018-12-25 DIAGNOSIS — R0982 Postnasal drip: Secondary | ICD-10-CM | POA: Diagnosis not present

## 2018-12-25 DIAGNOSIS — K219 Gastro-esophageal reflux disease without esophagitis: Secondary | ICD-10-CM | POA: Insufficient documentation

## 2018-12-25 DIAGNOSIS — J3089 Other allergic rhinitis: Secondary | ICD-10-CM

## 2018-12-25 HISTORY — DX: Other allergic rhinitis: J30.89

## 2018-12-25 MED ORDER — AZELASTINE HCL 0.15 % NA SOLN: 1.0000 | Freq: Two times a day (BID) | NASAL | 5 refills | Status: DC | PRN

## 2018-12-25 MED ORDER — FLUTICASONE PROPIONATE 50 MCG/ACT NA SUSP: 2.0000 | Freq: Every day | NASAL | 5 refills | Status: DC | PRN

## 2018-12-25 NOTE — Assessment & Plan Note (Signed)
   Aeroallergen avoidance measures have been discussed and provided in written form.  A prescription has been provided for azelastine nasal spray, 1-2 sprays per nostril 2 times daily as needed. Proper nasal spray technique has been discussed and demonstrated.   A prescription has been provided for fluticasone nasal spray, 2 sprays per nostril daily as needed.   Nasal saline lavage (NeilMed) has been recommended as needed and prior to medicated nasal sprays along with instructions for proper administration.  For thick post nasal drainage, add guaifenesin 1200 mg (Mucinex)  twice daily as needed with adequate hydration as discussed.  If allergen avoidance measures and medications fail to adequately relieve symptoms, aeroallergen immunotherapy will be considered.

## 2018-12-25 NOTE — Progress Notes (Signed)
New Patient Note  RE: Dean Guzman MRN: [id number] DOB: February 06, 1951 Date of Office Visit: 12/25/2018  Referring provider: Shelda Pal* Primary care provider: Shelda Pal, DO  Chief Complaint: Post nasal drainage and throat clearing   History of present illness: Dean Guzman is a 68 y.o. male seen today in consultation requested by Shelda Pal, DO.  He complains of "constantly clearing my throat."  He reports that this has been a problem for many years, however has progressed significantly over the past year.  He did move from [facility] to [place] approximately 1 year ago.  Otherwise, no significant seasonal symptom variation has been noted nor have specific environmental triggers been identified.  He has been evaluated by his primary care physician, an otolaryngologist, and a gastroenterologist.  One of his physicians noted posterior pharyngeal wall cobblestoning on examination.  And has tried montelukast, loratadine, fexofenadine, fluticasone nasal spray, and ipratropium nasal spray without benefit.  He is taking Dexilant for Barrett's esophagus.  He had been taking Prilosec prior to the diagnosis of Barrett's esophagus and was not experiencing heartburn at that time and has not experienced heartburn while on Dexilant.  He denies nasal congestion, sinus pressure, and ocular pruritus.  He does experience occasional clear rhinorrhea on occasion.  Assessment and plan: Perennial allergic rhinitis  Aeroallergen avoidance measures have been discussed and provided in written form.  A prescription has been provided for azelastine nasal spray, 1-2 sprays per nostril 2 times daily as needed. Proper nasal spray technique has been discussed and demonstrated.   A prescription has been provided for fluticasone nasal spray, 2 sprays per nostril daily as needed.   Nasal saline lavage (NeilMed) has been recommended as needed and prior to medicated nasal  sprays along with instructions for proper administration.  For thick post nasal drainage, add guaifenesin 1200 mg (Mucinex)  twice daily as needed with adequate hydration as discussed.  If allergen avoidance measures and medications fail to adequately relieve symptoms, aeroallergen immunotherapy will be considered.  GERD (gastroesophageal reflux disease)  Appropriate reflux lifestyle modifications have been provided.  For now, continue Dexilant as prescribed.  For the next 6 weeks, add famotidine (Pepcid) 20 mg twice daily.   Meds ordered this encounter  Medications  . Azelastine HCl 0.15 % SOLN    Sig: Place 1-2 sprays into both nostrils 2 (two) times daily as needed.    Dispense:  30 mL    Refill:  5  . fluticasone (FLONASE) 50 MCG/ACT nasal spray    Sig: Place 2 sprays into both nostrils daily as needed for allergies or rhinitis.    Dispense:  16 g    Refill:  5    Diagnostics: Epicutaneous testing: Negative despite a positive histamine control. Intradermal testing: Positive to mold mix 1, mold mix 2, mold mix 3, mold mix 4, cat hair, dog epithelia, and dust mite antigen.  Physical examination: Blood pressure (!) 152/70, pulse 67, temperature 98.3 F (36.8 C), temperature source Temporal, resp. rate 16, height 6' 1.39" (1.864 m), weight 247 lb 6.4 oz (112.2 kg), SpO2 96 %.  General: Alert, interactive, in no acute distress. HEENT: TMs pearly gray, turbinates minimally edematous without discharge, post-pharynx mildly erythematous. Neck: Supple without lymphadenopathy. Lungs: Clear to auscultation without wheezing, rhonchi or rales. CV: Normal S1, S2 without murmurs. Abdomen: Nondistended, nontender. Skin: Warm and dry, without lesions or rashes. Extremities:  No clubbing, cyanosis or edema. Neuro:   Grossly intact.  Review of  systems:  Review of systems negative except as noted in HPI / PMHx or noted below: Review of Systems  Constitutional: Negative.   HENT:  Negative.   Eyes: Negative.   Respiratory: Negative.   Cardiovascular: Negative.   Gastrointestinal: Negative.   Genitourinary: Negative.   Musculoskeletal: Negative.   Skin: Negative.   Neurological: Negative.   Endo/Heme/Allergies: Negative.   Psychiatric/Behavioral: Negative.     Past medical history:  Past Medical History:  Diagnosis Date  . BPH (benign prostatic hyperplasia)   . Colon polyps   . Frequent headaches   . GERD (gastroesophageal reflux disease)   . Hyperlipidemia   . Hypertension   . Kidney stones   . Migraines     Past surgical history:  History reviewed. No pertinent surgical history.  Family history: Family History  Problem Relation Age of Onset  . Arthritis Mother   . Heart disease Father   . Hyperlipidemia Father   . Hypertension Father   . Cancer Sister        breast cancer  . Diabetes Maternal Grandmother   . Hearing loss Paternal Grandmother   . Early death Paternal Grandmother   . Hearing loss Paternal Grandfather   . Early death Paternal Grandfather   . Allergic rhinitis Neg Hx   . Asthma Neg Hx   . Eczema Neg Hx   . Urticaria Neg Hx     Social history: Social History   Socioeconomic History  . Marital status: Married    Spouse name: Not on file  . Number of children: Not on file  . Years of education: Not on file  . Highest education level: Not on file  Occupational History  . Not on file  Social Needs  . Financial resource strain: Not on file  . Food insecurity    Worry: Not on file    Inability: Not on file  . Transportation needs    Medical: Not on file    Non-medical: Not on file  Tobacco Use  . Smoking status: Former Smoker    Quit date: 1980    Years since quitting: 40.6  . Smokeless tobacco: Never Used  Substance and Sexual Activity  . Alcohol use: Not on file    Comment: rarely  . Drug use: Never  . Sexual activity: Not on file  Lifestyle  . Physical activity    Days per week: Not on file    Minutes  per session: Not on file  . Stress: Not on file  Relationships  . Social Herbalist on phone: Not on file    Gets together: Not on file    Attends religious service: Not on file    Active member of club or organization: Not on file    Attends meetings of clubs or organizations: Not on file    Relationship status: Not on file  . Intimate partner violence    Fear of current or ex partner: Not on file    Emotionally abused: Not on file    Physically abused: Not on file    Forced sexual activity: Not on file  Other Topics Concern  . Not on file  Social History Narrative  . Not on file   Environmental History: The patient lives in a 38-year-old house with hardwood floors throughout, gas heat, and central air.  There is no known mold/water damage in the home.  There are dogs in home which have access to his bedroom.  He is a  former cigarette smoker having started smoking 1968 and quit in 1980.  Allergies as of 12/25/2018   No Known Allergies     Medication List       Accurate as of December 25, 2018 12:35 PM. If you have any questions, ask your nurse or doctor.        aspirin EC 81 MG tablet Take 81 mg by mouth daily.   Azelastine HCl 0.15 % Soln Place 1-2 sprays into both nostrils 2 (two) times daily as needed. Started by: Edmonia Lynch, MD   CENTRUM SILVER PO Take by mouth.   cholecalciferol 25 MCG (1000 UT) tablet Commonly known as: VITAMIN D3 Take 1,000 Units by mouth daily.   Clobetasol Propionate 0.05 % shampoo Use 1-2 times daily when itching arises.   Dexilant 60 MG capsule Generic drug: dexlansoprazole Take 60 mg by mouth daily.   Dilt-XR 240 MG 24 hr capsule Generic drug: diltiazem TAKE 1 CAPSULE BY MOUTH EVERY DAY What changed: how much to take Changed by: Shelda Pal, DO   Fish Oil 1200 MG Caps Take 1 capsule by mouth 2 (two) times daily.   fluticasone 50 MCG/ACT nasal spray Commonly known as: FLONASE Place 2 sprays into both  nostrils daily as needed for allergies or rhinitis. Started by: Edmonia Lynch, MD   hydrochlorothiazide 12.5 MG capsule Commonly known as: MICROZIDE Take 1 capsule (12.5 mg total) by mouth daily.   ipratropium 0.06 % nasal spray Commonly known as: ATROVENT Place 1 spray into both nostrils 2 (two) times daily.   lisinopril 10 MG tablet Commonly known as: ZESTRIL Take 1 tablet (10 mg total) by mouth daily.   simvastatin 10 MG tablet Commonly known as: ZOCOR TAKE 1 TABLET BY MOUTH EVERYDAY AT BEDTIME   tadalafil 5 MG tablet Commonly known as: CIALIS Take 5 mg by mouth daily as needed for erectile dysfunction.       Known medication allergies: No Known Allergies  I appreciate the opportunity to take part in Dean Guzman's care. Please do not hesitate to contact me with questions.  Sincerely,   R. Edgar Frisk, MD

## 2018-12-25 NOTE — Assessment & Plan Note (Signed)
   Appropriate reflux lifestyle modifications have been provided.  For now, continue Dexilant as prescribed.  For the next 6 weeks, add famotidine (Pepcid) 20 mg twice daily.

## 2018-12-25 NOTE — Patient Instructions (Addendum)
Perennial allergic rhinitis  Aeroallergen avoidance measures have been discussed and provided in written form.  A prescription has been provided for azelastine nasal spray, 1-2 sprays per nostril 2 times daily as needed. Proper nasal spray technique has been discussed and demonstrated.   A prescription has been provided for fluticasone nasal spray, 2 sprays per nostril daily as needed.   Nasal saline lavage (NeilMed) has been recommended as needed and prior to medicated nasal sprays along with instructions for proper administration.  For thick post nasal drainage, add guaifenesin 1200 mg (Mucinex)  twice daily as needed with adequate hydration as discussed.  If allergen avoidance measures and medications fail to adequately relieve symptoms, aeroallergen immunotherapy will be considered.  GERD (gastroesophageal reflux disease)  Appropriate reflux lifestyle modifications have been provided.  For now, continue Dexilant as prescribed.  For the next 6 weeks, add famotidine (Pepcid) 20 mg twice daily.   Return in about 6 weeks (around 02/05/2019), or if symptoms worsen or fail to improve.  Control of House Dust Mite Allergen  House dust mites play a major role in allergic asthma and rhinitis.  They occur in environments with high humidity wherever human skin, the food for dust mites is found. High levels have been detected in dust obtained from mattresses, pillows, carpets, upholstered furniture, bed covers, clothes and soft toys.  The principal allergen of the house dust mite is found in its feces.  A gram of dust may contain 1,000 mites and 250,000 fecal particles.  Mite antigen is easily measured in the air during house cleaning activities.    1. Encase mattresses, including the box spring, and pillow, in an air tight cover.  Seal the zipper end of the encased mattresses with wide adhesive tape. 2. Wash the bedding in water of 130 degrees Farenheit weekly.  Avoid cotton comforters/quilts  and flannel bedding: the most ideal bed covering is the dacron comforter. 3. Remove all upholstered furniture from the bedroom. 4. Remove carpets, carpet padding, rugs, and non-washable window drapes from the bedroom.  Wash drapes weekly or use plastic window coverings. 5. Remove all non-washable stuffed toys from the bedroom.  Wash stuffed toys weekly. 6. Have the room cleaned frequently with a vacuum cleaner and a damp dust-mop.  The patient should not be in a room which is being cleaned and should wait 1 hour after cleaning before going into the room. 7. Close and seal all heating outlets in the bedroom.  Otherwise, the room will become filled with dust-laden air.  An electric heater can be used to heat the room. 8. Reduce indoor humidity to less than 50%.  Do not use a humidifier.  Control of Dog or Cat Allergen  Avoidance is the best way to manage a dog or cat allergy. If you have a dog or cat and are allergic to dog or cats, consider removing the dog or cat from the home. If you have a dog or cat but don't want to find it a new home, or if your family wants a pet even though someone in the household is allergic, here are some strategies that may help keep symptoms at bay:  1. Keep the pet out of your bedroom and restrict it to only a few rooms. Be advised that keeping the dog or cat in only one room will not limit the allergens to that room. 2. Don't pet, hug or kiss the dog or cat; if you do, wash your hands with soap and water. 3. High-efficiency  particulate air (HEPA) cleaners run continuously in a bedroom or living room can reduce allergen levels over time. 4. Place electrostatic material sheet in the air inlet vent in the bedroom. 5. Regular use of a high-efficiency vacuum cleaner or a central vacuum can reduce allergen levels. 6. Giving your dog or cat a bath at least once a week can reduce airborne allergen.  Control of Mold Allergen  Mold and fungi can grow on a variety of  surfaces provided certain temperature and moisture conditions exist.  Outdoor molds grow on plants, decaying vegetation and soil.  The major outdoor mold, Alternaria and Cladosporium, are found in very high numbers during hot and dry conditions.  Generally, a late Summer - Fall peak is seen for common outdoor fungal spores.  Rain will temporarily lower outdoor mold spore count, but counts rise rapidly when the rainy period ends.  The most important indoor molds are Aspergillus and Penicillium.  Dark, humid and poorly ventilated basements are ideal sites for mold growth.  The next most common sites of mold growth are the bathroom and the kitchen.  Outdoor [employer] 1. Use air conditioning and keep windows closed 2. Avoid exposure to decaying vegetation. 3. Avoid leaf raking. 4. Avoid grain handling. 5. Consider wearing a face mask if working in moldy areas.  Indoor Mold Control 1. Maintain humidity below 50%. 2. Clean washable surfaces with 5% bleach solution. 3. Remove sources e.g. Contaminated carpets.  Lifestyle Changes for Controlling GERD  When you have GERD, stomach acid feels as if it's backing up toward your mouth. Whether or not you take medication to control your GERD, your symptoms can often be improved with lifestyle changes.   Raise Your Head  Reflux is more likely to strike when you're lying down flat, because stomach fluid can  flow backward more easily. Raising the head of your bed 4-6 inches can help. To do this:  Slide blocks or books under the legs at the head of your bed. Or, place a wedge under  the mattress. Many foam stores can make a suitable wedge for you. The wedge  should run from your waist to the top of your head.  Don't just prop your head on several pillows. This increases pressure on your  stomach. It can make GERD worse.  Watch Your Eating Habits Certain foods may increase the acid in your stomach or relax the lower esophageal sphincter, making  GERD more likely. It's best to avoid the following:  Coffee, tea, and carbonated drinks (with and without caffeine)  Fatty, fried, or spicy food  Mint, chocolate, onions, and tomatoes  Any other foods that seem to irritate your stomach or cause you pain  Relieve the Pressure  Eat smaller meals, even if you have to eat more often.  Don't lie down right after you eat. Wait a few hours for your stomach to empty.  Avoid tight belts and tight-fitting clothes.  Lose excess weight.  Tobacco and Alcohol  Avoid smoking tobacco and drinking alcohol. They can make GERD symptoms worse.

## 2019-01-02 ENCOUNTER — Other Ambulatory Visit: Payer: Self-pay | Admitting: Family Medicine

## 2019-01-02 DIAGNOSIS — I10 Essential (primary) hypertension: Secondary | ICD-10-CM

## 2019-02-05 ENCOUNTER — Encounter: Payer: Self-pay | Admitting: Allergy and Immunology

## 2019-02-05 ENCOUNTER — Ambulatory Visit: Payer: Managed Care, Other (non HMO) | Admitting: Allergy and Immunology

## 2019-02-05 ENCOUNTER — Other Ambulatory Visit: Payer: Self-pay

## 2019-02-05 DIAGNOSIS — K219 Gastro-esophageal reflux disease without esophagitis: Secondary | ICD-10-CM

## 2019-02-05 DIAGNOSIS — J3089 Other allergic rhinitis: Secondary | ICD-10-CM | POA: Diagnosis not present

## 2019-02-05 MED ORDER — CARBINOXAMINE MALEATE 4 MG PO TABS: 4.0000 mg | ORAL_TABLET | Freq: Four times a day (QID) | ORAL | 1 refills | Status: DC | PRN

## 2019-02-05 MED ORDER — AZELASTINE-FLUTICASONE 137-50 MCG/ACT NA SUSP: 2.0000 | Freq: Two times a day (BID) | NASAL | 5 refills | Status: DC | PRN

## 2019-02-05 NOTE — Progress Notes (Deleted)
0

## 2019-02-05 NOTE — Progress Notes (Signed)
Follow-up Note  RE: Dean Guzman MRN: [id number] DOB: July 19, 1950 Date of Office Visit: 02/05/2019  Primary care provider: Shelda Pal, DO Referring provider: Shelda Pal*  History of present illness: Kumari Abruzzese is a 68 y.o. male with perennial allergic rhinitis and gastroesophageal reflux presenting today for follow-up.  He was previously seen in this clinic for his initial evaluation on December 25, 2018.  He reports that his post nasal drainage improved for 1-2 weeks after starting azelastine and fluticasone nasal spray but then the symptoms started to return. He does not believe that famotidine or guaifenesin provided benefit.  Assessment and plan: Perennial allergic rhinitis Currently with suboptimal control.  Continue appropriate allergen avoidance measures.  A prescription has been provided for azelastine/fluticasone nasal spray, 2 sprays per nostril 1-2 times daily as needed.  Nasal saline lavage (NeilMed) has been recommended as needed and prior to medicated nasal sprays along with instructions for proper administration.  A prescription has been provided for carbinoxamine 4 mg every 6-8 hours as needed.  The patient has been warned of potential somnolence side effect and has been asked to try this medication first while at home.  If allergen avoidance measures and medications fail to adequately relieve symptoms, aeroallergen immunotherapy will be considered.  GERD (gastroesophageal reflux disease)  Continue appropriate reflux lifestyle modifications and Dexilant as prescribed.  Famotidine (Pepcid) 20 mg twice daily as needed.   Meds ordered this encounter  Medications  . Azelastine-Fluticasone 137-50 MCG/ACT SUSP    Sig: Place 2 sprays into the nose 2 (two) times daily as needed.    Dispense:  23 g    Refill:  5  . Carbinoxamine Maleate 4 MG TABS    Sig: Take 1 tablet (4 mg total) by mouth every 6 (six) hours as needed.    Dispense:  120  tablet    Refill:  1    Physical examination: Blood pressure 134/70, pulse (!) 58, temperature (!) 97.2 F (36.2 C), resp. rate 16, SpO2 96 %.  General: Alert, interactive, in no acute distress. HEENT: TMs pearly gray, turbinates moderately edematous without discharge, post-pharynx moderately erythematous. Neck: Supple without lymphadenopathy. Lungs: Clear to auscultation without wheezing, rhonchi or rales. CV: Normal S1, S2 without murmurs. Skin: Warm and dry, without lesions or rashes.  The following portions of the patient's history were reviewed and updated as appropriate: allergies, current medications, past family history, past medical history, past social history, past surgical history and problem list.  Allergies as of 02/05/2019   No Known Allergies     Medication List       Accurate as of February 05, 2019 12:03 PM. If you have any questions, ask your nurse or doctor.        aspirin EC 81 MG tablet Take 81 mg by mouth daily.   Azelastine HCl 0.15 % Soln Place 1-2 sprays into both nostrils 2 (two) times daily as needed.   Azelastine-Fluticasone 137-50 MCG/ACT Susp Place 2 sprays into the nose 2 (two) times daily as needed. Started by: Edmonia Lynch, MD   Carbinoxamine Maleate 4 MG Tabs Take 1 tablet (4 mg total) by mouth every 6 (six) hours as needed. Started by: Edmonia Lynch, MD   CENTRUM SILVER PO Take by mouth.   cholecalciferol 25 MCG (1000 UT) tablet Commonly known as: VITAMIN D3 Take 1,000 Units by mouth daily.   Clobetasol Propionate 0.05 % shampoo Use 1-2 times daily when itching arises.   Dexilant 60 MG  capsule Generic drug: dexlansoprazole Take 60 mg by mouth daily.   Dilt-XR 240 MG 24 hr capsule Generic drug: diltiazem TAKE 1 CAPSULE BY MOUTH EVERY DAY   Fish Oil 1200 MG Caps Take 1 capsule by mouth 2 (two) times daily.   fluticasone 50 MCG/ACT nasal spray Commonly known as: FLONASE Place 2 sprays into both nostrils daily as  needed for allergies or rhinitis.   hydrochlorothiazide 12.5 MG capsule Commonly known as: MICROZIDE TAKE 1 CAPSULE BY MOUTH EVERY DAY   ipratropium 0.06 % nasal spray Commonly known as: ATROVENT Place 1 spray into both nostrils 2 (two) times daily.   lisinopril 10 MG tablet Commonly known as: ZESTRIL Take 1 tablet (10 mg total) by mouth daily.   simvastatin 10 MG tablet Commonly known as: ZOCOR TAKE 1 TABLET BY MOUTH EVERYDAY AT BEDTIME   tadalafil 5 MG tablet Commonly known as: CIALIS Take 5 mg by mouth daily as needed for erectile dysfunction.       No Known Allergies  I appreciate the opportunity to take part in Mehki's care. Please do not hesitate to contact me with questions.  Sincerely,   R. Edgar Frisk, MD

## 2019-02-05 NOTE — Assessment & Plan Note (Signed)
Currently with suboptimal control.  Continue appropriate allergen avoidance measures.  A prescription has been provided for azelastine/fluticasone nasal spray, 2 sprays per nostril 1-2 times daily as needed.  Nasal saline lavage (NeilMed) has been recommended as needed and prior to medicated nasal sprays along with instructions for proper administration.  A prescription has been provided for carbinoxamine 4 mg every 6-8 hours as needed.  The patient has been warned of potential somnolence side effect and has been asked to try this medication first while at home.  If allergen avoidance measures and medications fail to adequately relieve symptoms, aeroallergen immunotherapy will be considered.

## 2019-02-05 NOTE — Patient Instructions (Addendum)
Perennial allergic rhinitis Currently with suboptimal control.  Continue appropriate allergen avoidance measures.  A prescription has been provided for azelastine/fluticasone nasal spray, 2 sprays per nostril 1-2 times daily as needed.  Nasal saline lavage (NeilMed) has been recommended as needed and prior to medicated nasal sprays along with instructions for proper administration.  A prescription has been provided for carbinoxamine 4 mg every 6-8 hours as needed.  The patient has been warned of potential somnolence side effect and has been asked to try this medication first while at home.  If allergen avoidance measures and medications fail to adequately relieve symptoms, aeroallergen immunotherapy will be considered.  GERD (gastroesophageal reflux disease)  Continue appropriate reflux lifestyle modifications and Dexilant as prescribed.  Famotidine (Pepcid) 20 mg twice daily as needed.   Return in about 4 months (around 06/07/2019), or if symptoms worsen or fail to improve.

## 2019-02-05 NOTE — Assessment & Plan Note (Signed)
   Continue appropriate reflux lifestyle modifications and Dexilant as prescribed.  Famotidine (Pepcid) 20 mg twice daily as needed.

## 2019-02-23 ENCOUNTER — Other Ambulatory Visit: Payer: Self-pay | Admitting: Family Medicine

## 2019-02-23 DIAGNOSIS — I10 Essential (primary) hypertension: Secondary | ICD-10-CM

## 2019-03-23 ENCOUNTER — Other Ambulatory Visit: Payer: Self-pay | Admitting: Family Medicine

## 2019-03-23 DIAGNOSIS — I10 Essential (primary) hypertension: Secondary | ICD-10-CM

## 2019-03-29 ENCOUNTER — Other Ambulatory Visit: Payer: Self-pay | Admitting: *Deleted

## 2019-03-29 MED ORDER — CARBINOXAMINE MALEATE 4 MG PO TABS: 4.0000 mg | ORAL_TABLET | Freq: Four times a day (QID) | ORAL | 1 refills | Status: DC | PRN

## 2019-05-16 ENCOUNTER — Telehealth: Payer: Self-pay

## 2019-05-16 ENCOUNTER — Other Ambulatory Visit: Payer: Self-pay

## 2019-05-16 MED ORDER — CARBINOXAMINE MALEATE 4 MG PO TABS: 4.0000 mg | ORAL_TABLET | Freq: Four times a day (QID) | ORAL | 0 refills | Status: DC | PRN

## 2019-05-16 NOTE — Telephone Encounter (Signed)
Refill request for Carbinoxamine Maleate 4 mg tab from pharmacy. Will send in 1 month refill until his next appointment which should be in January 2021.

## 2019-05-19 ENCOUNTER — Other Ambulatory Visit: Payer: Self-pay | Admitting: Family Medicine

## 2019-05-19 DIAGNOSIS — I10 Essential (primary) hypertension: Secondary | ICD-10-CM

## 2019-05-21 ENCOUNTER — Encounter: Payer: Self-pay | Admitting: Family Medicine

## 2019-06-13 ENCOUNTER — Other Ambulatory Visit: Payer: Self-pay

## 2019-06-14 ENCOUNTER — Ambulatory Visit (INDEPENDENT_AMBULATORY_CARE_PROVIDER_SITE_OTHER): Payer: Managed Care, Other (non HMO) | Admitting: Family Medicine

## 2019-06-14 ENCOUNTER — Encounter: Payer: Self-pay | Admitting: Family Medicine

## 2019-06-14 VITALS — BP 120/70 | HR 63 | Temp 97.0°F | Ht 73.0 in | Wt 258.5 lb

## 2019-06-14 DIAGNOSIS — Z0000 Encounter for general adult medical examination without abnormal findings: Secondary | ICD-10-CM

## 2019-06-14 LAB — COMPREHENSIVE METABOLIC PANEL
ALT: 30 U/L (ref 0–53)
AST: 24 U/L (ref 0–37)
Albumin: 4.3 g/dL (ref 3.5–5.2)
Alkaline Phosphatase: 63 U/L (ref 39–117)
BUN: 22 mg/dL (ref 6–23)
CO2: 30 mEq/L (ref 19–32)
Calcium: 9.4 mg/dL (ref 8.4–10.5)
Chloride: 98 mEq/L (ref 96–112)
Creatinine, Ser: 1.22 mg/dL (ref 0.40–1.50)
GFR: 58.94 mL/min — ABNORMAL LOW (ref >60.00)
Glucose, Bld: 113 mg/dL — ABNORMAL HIGH (ref 70–99)
Potassium: 4.2 mEq/L (ref 3.5–5.1)
Sodium: 135 mEq/L (ref 135–145)
Total Bilirubin: 0.6 mg/dL (ref 0.2–1.2)
Total Protein: 6.8 g/dL (ref 6.0–8.3)

## 2019-06-14 LAB — LIPID PANEL
Cholesterol: 196 mg/dL (ref 0–200)
HDL: 41 mg/dL (ref >39.00)
LDL Cholesterol: 128 mg/dL — ABNORMAL HIGH (ref 0–99)
NonHDL: 155.32
Total CHOL/HDL Ratio: 5
Triglycerides: 137 mg/dL (ref 0.0–149.0)
VLDL: 27.4 mg/dL (ref 0.0–40.0)

## 2019-06-14 NOTE — Patient Instructions (Signed)
Give us 2-3 business days to get the results of your labs back.   Keep the diet clean and stay active.  Let us know if you need anything. 

## 2019-06-14 NOTE — Progress Notes (Signed)
Chief Complaint  Patient presents with  . Annual Exam    Well Male Maksym Pfiffner is here for a complete physical.   His last physical was >1 year ago.  Current diet: in general, diet is fair.    Current exercise: walking Weight trend: gained a little Daytime fatigue? No. Seat belt? Yes.    Health maintenance Shingrix- Yes Colonoscopy- Yes Tetanus- Yes Hep C- Yes Pneumonia vaccine- Yes  Past Medical History:  Diagnosis Date  . BPH (benign prostatic hyperplasia)   . Colon polyps   . Frequent headaches   . GERD (gastroesophageal reflux disease)   . Hyperlipidemia   . Hypertension   . Kidney stones   . Migraines      History reviewed. No pertinent surgical history.  Medications  Current Outpatient Medications on File Prior to Visit  Medication Sig Dispense Refill  . aspirin EC 81 MG tablet Take 81 mg by mouth daily.    . Azelastine-Fluticasone 137-50 MCG/ACT SUSP Place 2 sprays into the nose 2 (two) times daily as needed. 23 g 5  . Carbinoxamine Maleate 4 MG TABS Take 1 tablet (4 mg total) by mouth every 6 (six) hours as needed. 120 tablet 0  . cholecalciferol (VITAMIN D3) 25 MCG (1000 UT) tablet Take 1,000 Units by mouth daily.    Marland Kitchen DEXILANT 60 MG capsule Take 60 mg by mouth daily.    Marland Kitchen DILT-XR 240 MG 24 hr capsule TAKE 1 CAPSULE BY MOUTH EVERY DAY 90 capsule 1  . hydrochlorothiazide (MICROZIDE) 12.5 MG capsule TAKE 1 CAPSULE BY MOUTH EVERY DAY 90 capsule 0  . lisinopril (ZESTRIL) 10 MG tablet TAKE 1 TABLET BY MOUTH EVERY DAY 90 tablet 0  . Omega-3 Fatty Acids (FISH OIL) 1200 MG CAPS Take 1 capsule by mouth 2 (two) times daily.    . simvastatin (ZOCOR) 10 MG tablet TAKE 1 TABLET BY MOUTH EVERYDAY AT BEDTIME 90 tablet 1  . tadalafil (CIALIS) 5 MG tablet Take 5 mg by mouth daily as needed for erectile dysfunction.      Allergies No Known Allergies  Family History Family History  Problem Relation Age of Onset  . Arthritis Mother   . Heart disease Father   .  Hyperlipidemia Father   . Hypertension Father   . Cancer Sister        breast cancer  . Diabetes Maternal Grandmother   . Hearing loss Paternal Grandmother   . Early death Paternal Grandmother   . Hearing loss Paternal Grandfather   . Early death Paternal Grandfather   . Allergic rhinitis Neg Hx   . Asthma Neg Hx   . Eczema Neg Hx   . Urticaria Neg Hx     Review of Systems: Constitutional:  no fevers or chills Eye:  no recent significant change in vision Ear/Nose/Mouth/Throat:  Ears:  no recent hearing loss Nose/Mouth/Throat:  no complaints of nasal congestion or sore throat Cardiovascular:  no chest pain Respiratory:  no shortness of breath Gastrointestinal:  no abdominal pain, no change in bowel habits GU:  Male: negative for dysuria, frequency, and incontinence  Musculoskeletal/Extremities:  no pain of the joints Integumentary (Skin):  no abnormal skin lesions reported Neurologic:  no headaches, Endocrine:  No unexpected weight changes Hematologic/Lymphatic:  no areas of easy bruising  Exam BP 120/70 (BP Location: Left Arm, Patient Position: Sitting, Cuff Size: Large)   Pulse 63   Temp (!) 97 F (36.1 C) (Temporal)   Ht '6\' 1"'  (1.854 m)  Wt 258 lb 8 oz (117.3 kg)   SpO2 96%   BMI 34.10 kg/m  General:  well developed, well nourished, in no apparent distress Skin:  no significant moles, warts, or growths Head:  no masses, lesions, or tenderness Eyes:  pupils equal and round, sclera anicteric without injection Ears:  canals without lesions, TMs shiny without retraction, no obvious effusion, no erythema Nose:  nares patent, septum midline, mucosa normal Throat/Pharynx:  lips and gingiva without lesion; tongue and uvula midline; non-inflamed pharynx; no exudates or postnasal drainage Neck: neck supple without adenopathy, thyromegaly, or masses Lungs:  clear to auscultation, breath sounds equal bilaterally, no respiratory distress Cardio:  regular rate and rhythm, no LE  edema or bruits Rectal: Deferred Musculoskeletal:  symmetrical muscle groups noted without atrophy or deformity Extremities:  no clubbing, cyanosis, or edema, no deformities, no skin discoloration Neuro:  gait normal; deep tendon reflexes normal and symmetric Psych: well oriented with normal range of affect and appropriate judgment/insight  Assessment and Plan  Well adult exam - Plan: Lipid Profile, Comp Met (CMET)   Well 69 y.o. male. Counseled on diet and exercise. Has PSA ck'd thru Urology team.  Other orders as above. Follow up in 6 mo or prn.  The patient voiced understanding and agreement to the plan.  [place], DO 06/14/19 9:47 AM

## 2019-06-16 ENCOUNTER — Other Ambulatory Visit: Payer: Self-pay | Admitting: Family Medicine

## 2019-06-16 DIAGNOSIS — I10 Essential (primary) hypertension: Secondary | ICD-10-CM

## 2019-06-18 ENCOUNTER — Other Ambulatory Visit: Payer: Self-pay | Admitting: Family Medicine

## 2019-07-12 ENCOUNTER — Other Ambulatory Visit: Payer: Self-pay

## 2019-07-12 MED ORDER — CARBINOXAMINE MALEATE 4 MG PO TABS: 4.0000 mg | ORAL_TABLET | Freq: Four times a day (QID) | ORAL | 5 refills | Status: DC | PRN

## 2019-07-12 NOTE — Telephone Encounter (Signed)
RF for carbinoxamine 4 mg  X 1 with 5 refills at [facility]

## 2019-07-15 ENCOUNTER — Ambulatory Visit: Payer: Managed Care, Other (non HMO)

## 2019-07-24 ENCOUNTER — Encounter: Payer: Self-pay | Admitting: Family Medicine

## 2019-08-08 ENCOUNTER — Other Ambulatory Visit: Payer: Self-pay | Admitting: Family Medicine

## 2019-08-08 DIAGNOSIS — I10 Essential (primary) hypertension: Secondary | ICD-10-CM

## 2019-08-09 ENCOUNTER — Other Ambulatory Visit: Payer: Self-pay | Admitting: Allergy and Immunology

## 2019-08-15 ENCOUNTER — Other Ambulatory Visit: Payer: Self-pay | Admitting: Urology

## 2019-08-15 DIAGNOSIS — R972 Elevated prostate specific antigen [PSA]: Secondary | ICD-10-CM

## 2019-09-03 MED ORDER — AZELASTINE-FLUTICASONE 137-50 MCG/ACT NA SUSP: 2.0000 | Freq: Two times a day (BID) | NASAL | 0 refills | Status: DC | PRN

## 2019-09-11 ENCOUNTER — Other Ambulatory Visit: Payer: Self-pay | Admitting: Family Medicine

## 2019-09-11 DIAGNOSIS — I10 Essential (primary) hypertension: Secondary | ICD-10-CM

## 2019-09-16 ENCOUNTER — Other Ambulatory Visit: Payer: Self-pay

## 2019-09-16 ENCOUNTER — Ambulatory Visit
Admission: RE | Admit: 2019-09-16 | Discharge: 2019-09-16 | Disposition: A | Discharge status: Home / Self Care | Payer: Managed Care, Other (non HMO) | Source: Ambulatory Visit | Attending: Urology | Admitting: Urology

## 2019-09-16 DIAGNOSIS — R972 Elevated prostate specific antigen [PSA]: Secondary | ICD-10-CM

## 2019-09-16 MED ORDER — GADOBENATE DIMEGLUMINE 529 MG/ML IV SOLN
20.0000 mL | Freq: Once | INTRAVENOUS | Status: AC | PRN
  Administered 2019-09-16: 20 mL via INTRAVENOUS

## 2019-09-24 ENCOUNTER — Other Ambulatory Visit: Payer: Self-pay

## 2019-09-24 ENCOUNTER — Encounter: Payer: Self-pay | Admitting: Family Medicine

## 2019-09-24 ENCOUNTER — Ambulatory Visit (INDEPENDENT_AMBULATORY_CARE_PROVIDER_SITE_OTHER): Payer: Managed Care, Other (non HMO) | Admitting: Family Medicine

## 2019-09-24 VITALS — BP 144/80 | HR 61 | Temp 96.1°F | Ht 73.5 in | Wt 251.1 lb

## 2019-09-24 DIAGNOSIS — I10 Essential (primary) hypertension: Secondary | ICD-10-CM

## 2019-09-24 DIAGNOSIS — T466X5A Adverse effect of antihyperlipidemic and antiarteriosclerotic drugs, initial encounter: Secondary | ICD-10-CM | POA: Diagnosis not present

## 2019-09-24 DIAGNOSIS — M791 Myalgia, unspecified site: Secondary | ICD-10-CM

## 2019-09-24 NOTE — Patient Instructions (Addendum)
Try to drink 60-65 oz of water daily outside of what you are drinking with exercise/physical activity.  Stop the simvastatin for 3 weeks. Restart after this period. I need to know if the thigh pain does not get better over the next 3 weeks.   Heat (pad or rice pillow in microwave) over affected area, 10-15 minutes twice daily.   Keep the diet clean and stay active.  I want your blood pressure less than 150/90 consistently. If it is not, send me a message in 2 weeks and we will make a change.   Let us know if you need anything.  Quadriceps Strain Rehab It is normal to feel mild stretching, pulling, tightness, or discomfort as you do these exercises, but you should stop right away if you feel sudden pain or your pain gets worse. Stretching and range of motion exercises These exercises warm up your muscles and joints and improve the movement and flexibility of your thigh. These exercises can also help to relieve stiffness or swelling. Exercise A: Heel slides   1. Lie on your back with both knees straight. If this causes back discomfort, bend the knee of your healthy leg, placing your foot flat on the floor. 2. Slowly slide your left / right heel back toward your buttocks until you feel a gentle stretch in the front of your knee or thigh. 3. Hold for 30 seconds. Then slowly slide your heel back to the starting position. Repeat 2 times. Complete this exercise 3 times a week. Exercise B: Quadriceps stretch, prone   1. Lie on your abdomen on a firm surface, such as a bed or padded floor. 2. Bend your left / right knee and hold your ankle. If you cannot reach your ankle or pant leg, loop a belt around your foot and grab the belt instead. 3. Gently pull your heel toward your buttocks. Your knee should not slide out to the side. You should feel a stretch in the front of your thigh and knee. 4. Hold this position for 30 seconds. Repeat 2 times. Complete this exercise 3 times a  week. Strengthening exercises These exercises build strength and endurance in your thigh. Endurance is the ability to use your muscles for a long time, even after your muscles get tired. Exercise C: Straight leg raises (quadriceps and hip flexors) Quality counts! Watch for signs that the quadriceps muscle is working to ensure that you are strengthening the correct muscles and not cheating by using healthier muscles. 1. Lie on your back with your left / right leg extended and your other knee bent. 2. Tense the muscles in the front of your left / right thigh. You should see your kneecap slide up or see increased dimpling just above the knee. 3. Tighten these muscles even more and raise your leg 4-6 inches (10-15 cm) off the floor. 4. Hold for 3 seconds. 5. Keep the thigh muscles tense as you lower your leg. 6. Relax the muscles slowly and completely after each repetition. Repeat 2 times. Complete this exercise 3 times a week. Exercise D: Straight leg raises (hip extensors) 1. Lie on your belly on a bed or a firm surface with a pillow under your hips. 2. Bend your left / right knee so your foot is straight up in the air. 3. Tense your buttock muscles and lift your left / right thigh off the bed. Do not let your back arch. 4. Hold this position for 3 seconds. 5. Slowly return to the starting  position. Let your muscles relax completely before doing another repetition. Repeat 2 times. Complete this exercise 3 times a week. Exercise E: Wall sits   Follow the directions for form closely. If you do not place your feet and knees properly, this can lead to knee pain. 1. Lean back against a smooth wall or door and walk your feet out 18-24 inches (46-61 cm) from it. Place your feet hip-width apart. 2. Slowly slide down the wall or door until your knees bend  60-90 degrees. Keep your weight back and over your heels, not over your toes. Keep your thighs straight or pointing slightly outward. 3. Hold for 1  second. 4. Use your thigh and buttock muscles to push you back up to a standing position. Keep your weight through your heels while you do this. 5. Rest for 5 seconds in between repetitions. Repeat 2 times. Complete this exercise 3 times a week. Make sure you discuss any questions you have with your health care provider. Document Released: 05/16/2005 Document Revised: 01/21/2016 Document Reviewed: 02/17/2015 Elsevier Interactive Patient Education  [employer].

## 2019-09-24 NOTE — Progress Notes (Signed)
Chief Complaint  Patient presents with  . Hypertension  . Leg Pain    Subjective Dean Guzman is a 69 y.o. male who presents for hypertension follow up. He does monitor home blood pressures. Blood pressures ranging from 150-160's/70-80's on average. He is compliant with medications- lisinopril 10 mg/d, HCTZ 12.5 mg/d. Patient has these side effects of medication: none He is usually adhering to a healthy diet overall. Current exercise: active in yard, walking  B/l thigh pain for months, better after decreasing dosage of simvastatin. Does not drink much water as he should. No bruising, swelling, neurologic s/s's. No recent inj or change in activity.    Past Medical History:  Diagnosis Date  . BPH (benign prostatic hyperplasia)   . Colon polyps   . Frequent headaches   . GERD (gastroesophageal reflux disease)   . Hyperlipidemia   . Hypertension   . Kidney stones   . Migraines     Review of Systems Cardiovascular: no chest pain Respiratory:  no shortness of breath  Exam BP (!) 144/80 (BP Location: Left Arm, Patient Position: Sitting, Cuff Size: Normal)   Pulse 61   Temp (!) 96.1 F (35.6 C) (Temporal)   Ht 6' 1.5" (1.867 m)   Wt 251 lb 2 oz (113.9 kg)   SpO2 98%   BMI 32.68 kg/m  General:  well developed, well nourished, in no apparent distress Heart: RRR, no bruits, no LE edema Lungs: clear to auscultation, no accessory muscle use MSK: +TTP over quads b/l, +[place]inchfield b/l, 5/5 strength b/l LE  Psych: well oriented with normal range of affect and appropriate judgment/insight  Essential hypertension  Myalgia due to statin  1- Chronic, uncontrolled. Cont meds, monitor BP. If still elevated in 2 weeks, he will message me and will double ACEi. Counseled on diet and exercise. 2- chronic, uncontrolled. 3 week statin holiday. Increase water intake. Stretches/exercuses.  F/u pending above. The patient voiced understanding and agreement to the plan.  [place], DO 09/24/19  1:27 PM

## 2019-09-26 IMAGING — CT CT ABD-PELV W/ CM
2 of 5 series · 16 of 46 positions shown, 18 images · IV contrast (APPLIED)
Comparison: None.

CLINICAL DATA: Right lower quadrant pain and tenderness

EXAM:
CT ABDOMEN AND PELVIS WITH CONTRAST
TECHNIQUE: Multidetector CT imaging of the abdomen and pelvis was performed
using the standard protocol following bolus administration of
intravenous contrast.
CONTRAST:  100mL [id number]-I00 IOPAMIDOL ([id number]-I00) INJECTION 61%,
additional oral enteric contrast

[Series 2: axial st · axial · 0.97mm/px · z∈[-604,-70]mm · 13 of 119 slices shown, 15 images]
[im 6/119  soft-tissue]
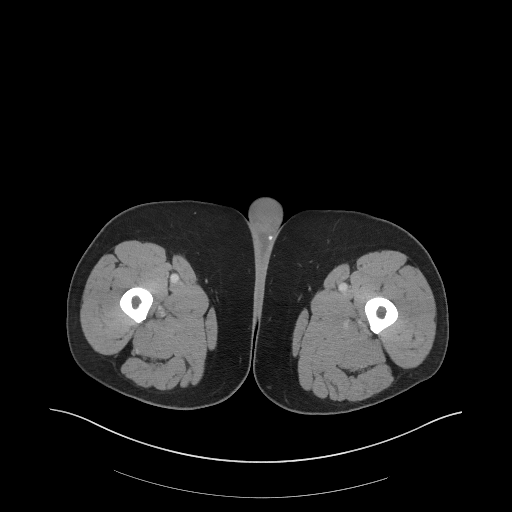
[im 6/119  bone]
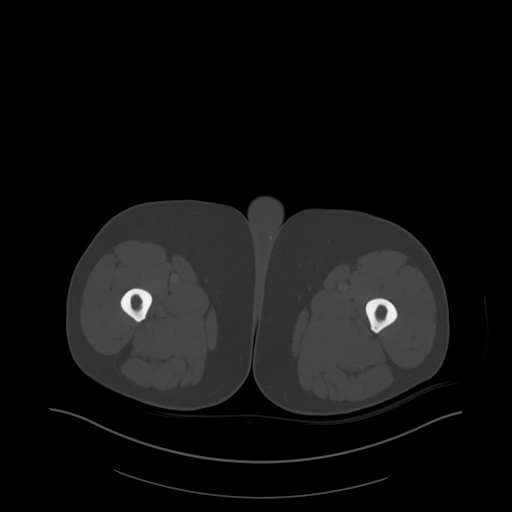
[im 18/119  soft-tissue]
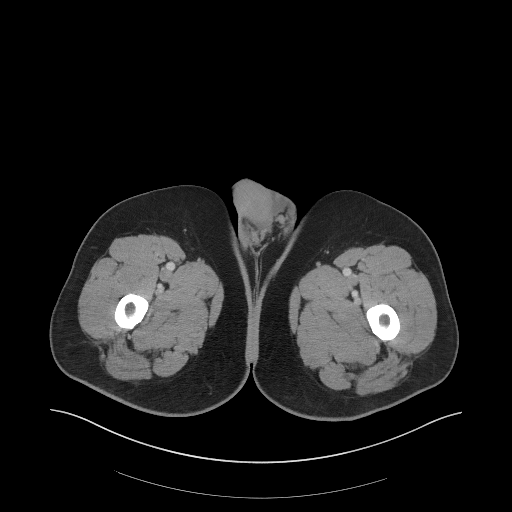
[im 24/119  soft-tissue]
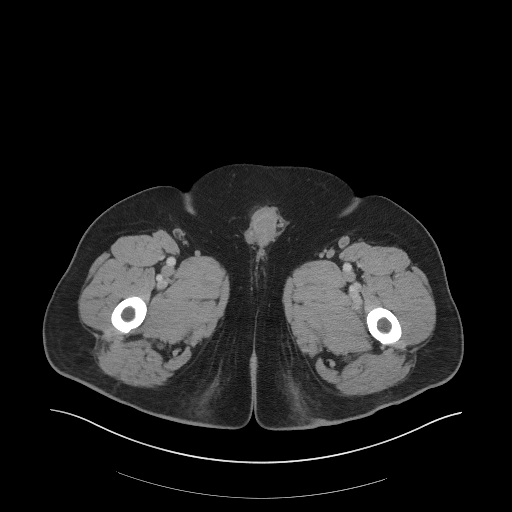
[im 36/119  soft-tissue]
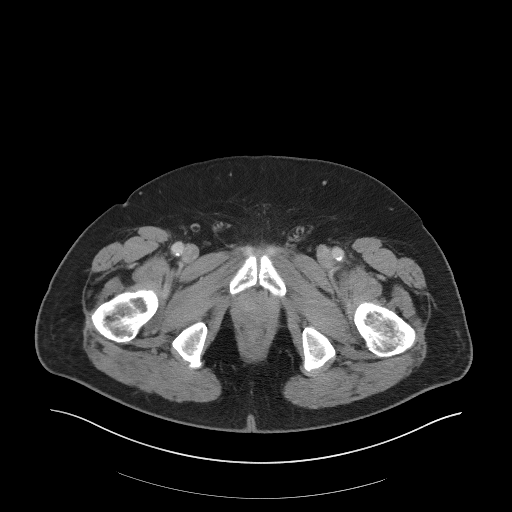
[im 42/119  soft-tissue]
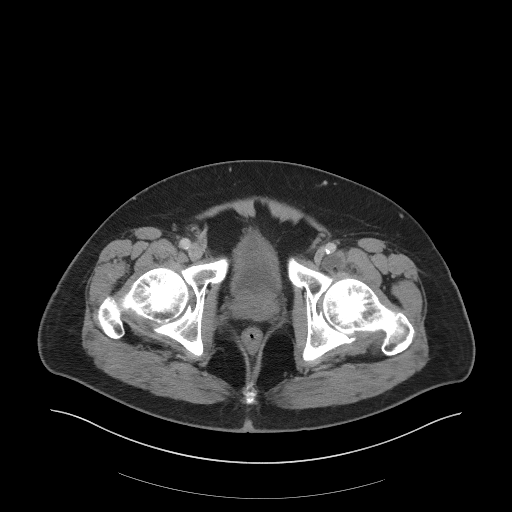
[im 54/119  soft-tissue]
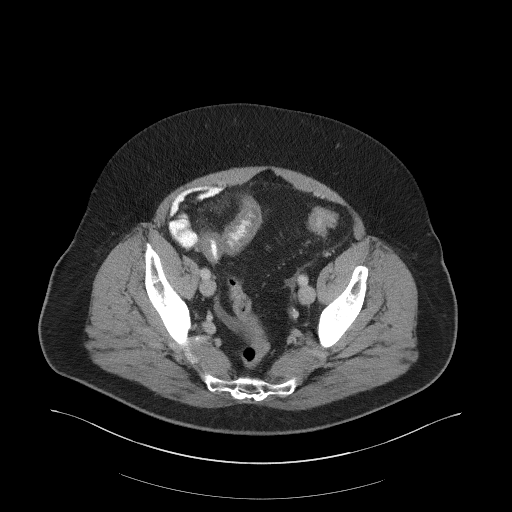
[im 60/119  soft-tissue]
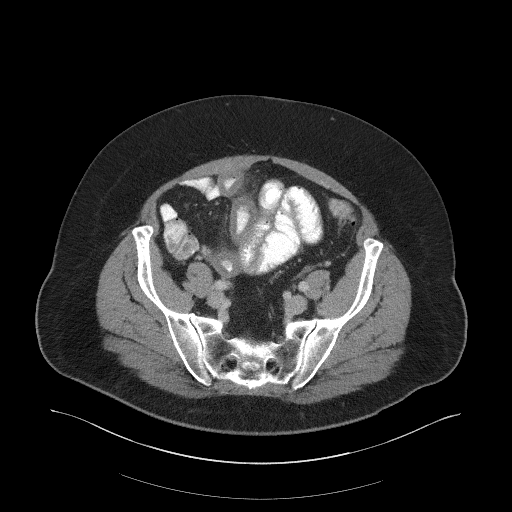
[im 65/119  soft-tissue]
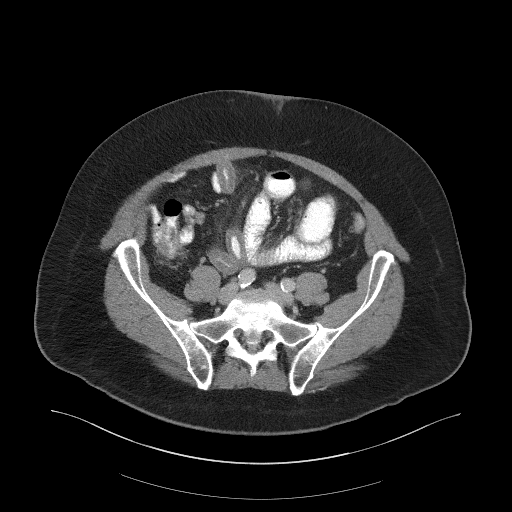
[im 77/119  soft-tissue]
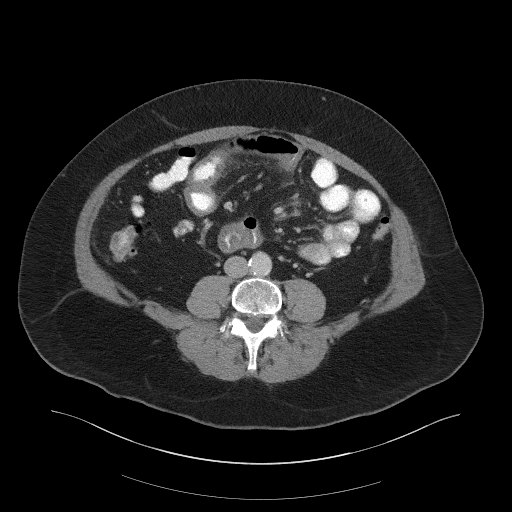
[im 77/119  bone]
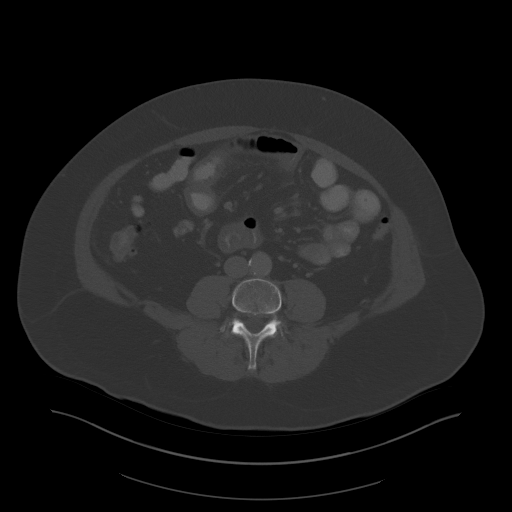
[im 83/119  soft-tissue]
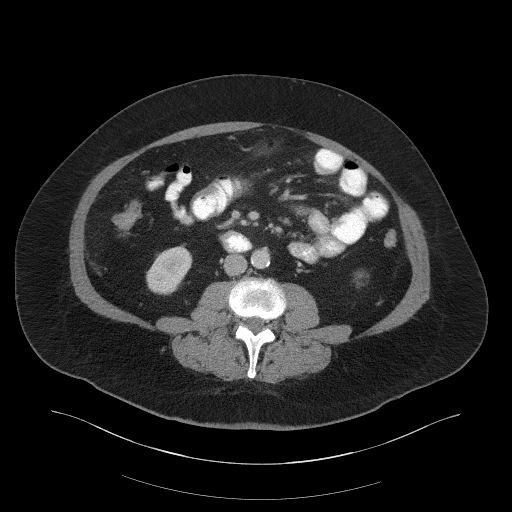
[im 95/119  soft-tissue]
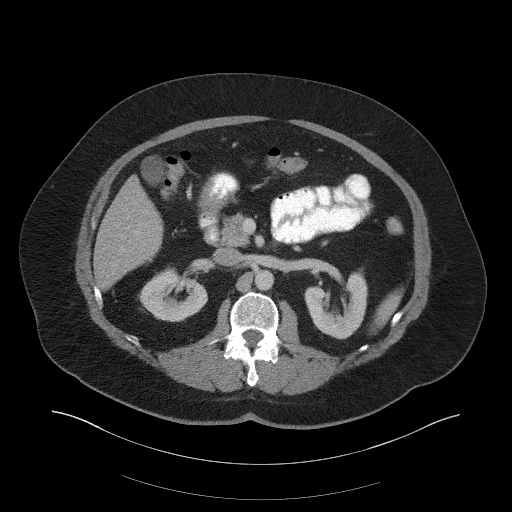
[im 101/119  soft-tissue]
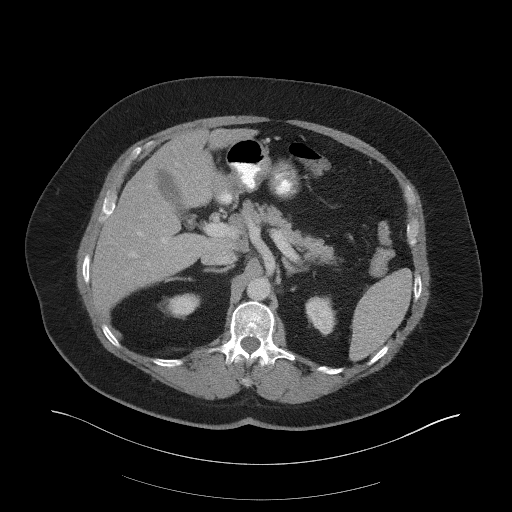
[im 113/119  soft-tissue]
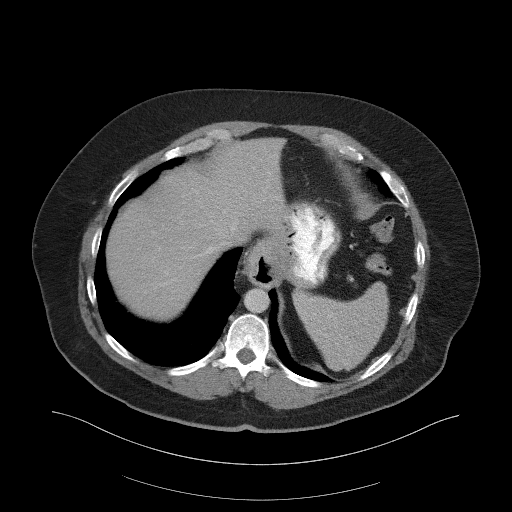

[Series 4: coronal st · coronal · 0.87mm/px · 3 of 121 slices shown]
[im 41/121  soft-tissue]
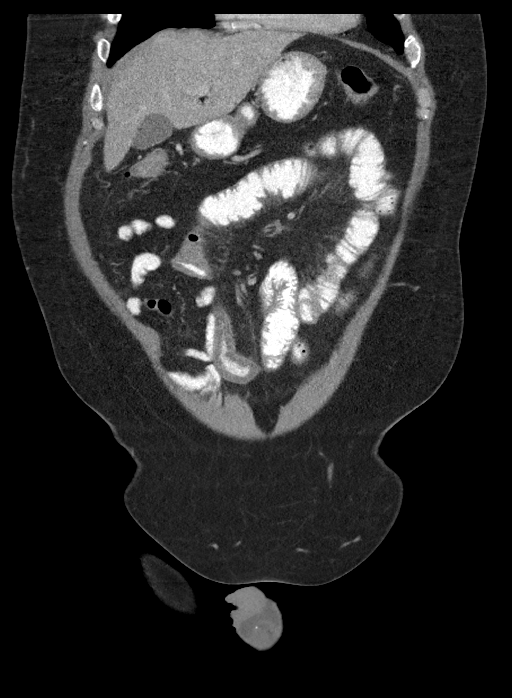
[im 54/121  soft-tissue]
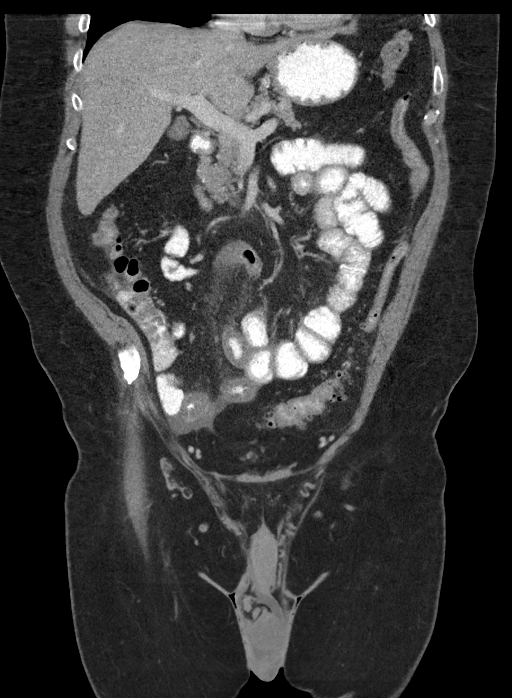
[im 67/121  soft-tissue]
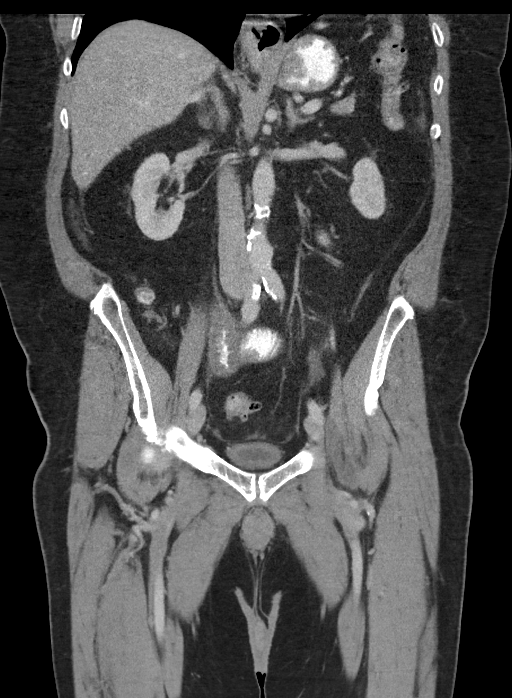

[16 of 46 positions shown; findings below may reference images not displayed]

FINDINGS: Lower chest: No acute abnormality.

Hepatobiliary: No focal liver abnormality is seen. No gallstones,
gallbladder wall thickening, or biliary dilatation.

Pancreas: Unremarkable. No pancreatic ductal dilatation or
surrounding inflammatory changes.

Spleen: Normal in size without focal abnormality.

Adrenals/Urinary Tract: Adrenal glands are unremarkable. Kidneys are
normal, without renal calculi, focal lesion, or hydronephrosis.
Bladder is unremarkable.

Stomach/Bowel: Stomach is within normal limits. Appendix appears
normal. There are multiple loops of thickened, inflamed appearing
distal ileum, which do not involve the most terminal ileum and
cecum. Severe sigmoid diverticulosis without evidence of acute
diverticulitis.

Vascular/Lymphatic: Calcific atherosclerosis. No enlarged abdominal
or pelvic lymph nodes.

Reproductive: No mass or other abnormality.

Other: No abdominal wall hernia or abnormality. Small volume ascites
in the low abdomen and pelvis.

Musculoskeletal: No acute or significant osseous findings.
IMPRESSION: 1. There are multiple loops of thickened, inflamed appearing distal
ileum, which do not involve the most terminal ileum and cecum. Small
volume presumably reactive ascites in the low abdomen and pelvis.
These findings are consistent with nonspecific infectious,
inflammatory, or ischemic enteritis, and this pattern can be seen in
inflammatory bowel disease such as Crohn's.

2.  Normal appendix.

3.  Other chronic and incidental findings as above.

## 2019-10-01 ENCOUNTER — Other Ambulatory Visit: Payer: Self-pay

## 2019-10-01 ENCOUNTER — Ambulatory Visit: Payer: Managed Care, Other (non HMO) | Admitting: Allergy and Immunology

## 2019-10-01 ENCOUNTER — Encounter: Payer: Self-pay | Admitting: Allergy and Immunology

## 2019-10-01 DIAGNOSIS — J3089 Other allergic rhinitis: Secondary | ICD-10-CM | POA: Diagnosis not present

## 2019-10-01 DIAGNOSIS — K219 Gastro-esophageal reflux disease without esophagitis: Secondary | ICD-10-CM | POA: Diagnosis not present

## 2019-10-01 MED ORDER — IPRATROPIUM BROMIDE 0.06 % NA SOLN
NASAL | 5 refills | Status: DC
Start: 2019-10-01 — End: 2020-04-28

## 2019-10-01 MED ORDER — AZELASTINE-FLUTICASONE 137-50 MCG/ACT NA SUSP: 2.0000 | Freq: Two times a day (BID) | NASAL | 5 refills | Status: DC | PRN

## 2019-10-01 NOTE — Progress Notes (Signed)
Follow-up Note  RE: Aaronn Clemons MRN: [id number] DOB: 11/15/1950 Date of Office Visit: 10/01/2019  Primary care provider: Shelda Pal, DO Referring provider: Shelda Pal*  History of present illness: Dean Guzman is a 69 y.o. male with allergic rhinitis and gastroesophageal reflux presenting today for follow-up. He was last seen in this clinic in September 2020.  He reports that he has been using azelastine/fluticasone nasal spray, 1 spray per nostril twice daily with some benefit.  However, he is still experiencing frequent postnasal drainage and throat clearing.  He tried carbinoxamine, however discontinued this medication due to lack of benefit.  He continues to take Dexilant daily for GERD control.  Assessment and plan: Perennial allergic rhinitis Currently with suboptimal control.  Continue appropriate allergen avoidance measures.  A refill prescription has been provided for azelastine/fluticasone nasal spray, 1-2 sprays per nostril 2 times daily as needed.  A prescription has been provided for ipratropium 0.06% nasal spray, 2 sprays per nostril 2 or 3 times daily as needed.  Nasal saline lavage (NeilMed) has been recommended as needed and prior to medicated nasal sprays along with instructions for proper administration.  If allergen avoidance measures and medications fail to adequately relieve symptoms, aeroallergen immunotherapy will be considered.  GERD (gastroesophageal reflux disease)  Continue appropriate reflux lifestyle modifications and Dexilant as prescribed.  Famotidine (Pepcid) 20 mg twice daily as needed.  Follow-up with gastroenterologist as recommended.   Meds ordered this encounter  Medications  . Azelastine-Fluticasone 137-50 MCG/ACT SUSP    Sig: Place 2 sprays into the nose 2 (two) times daily as needed.    Dispense:  23 g    Refill:  5    Courtesy refill.  Marland Kitchen ipratropium (ATROVENT) 0.06 % nasal spray    Sig: 2 sprays per  nostril 2-3 times daily as needed    Dispense:  15 mL    Refill:  5    Physical examination: Blood pressure 140/76, pulse (!) 59, temperature 97.9 F (36.6 C), temperature source Temporal, resp. rate 16, height 6' 1.7" (1.872 m), weight 256 lb (116.1 kg), SpO2 96 %.  General: Alert, interactive, in no acute distress. HEENT: TMs pearly gray, turbinates mildly edematous without discharge, post-pharynx moderately erythematous. Neck: Supple without lymphadenopathy. Lungs: Clear to auscultation without wheezing, rhonchi or rales. CV: Normal S1, S2 without murmurs. Skin: Warm and dry, without lesions or rashes.  The following portions of the patient's history were reviewed and updated as appropriate: allergies, current medications, past family history, past medical history, past social history, past surgical history and problem list.  Current Outpatient Medications  Medication Sig Dispense Refill  . aspirin EC 81 MG tablet Take 81 mg by mouth daily.    . Azelastine-Fluticasone 137-50 MCG/ACT SUSP Place 2 sprays into the nose 2 (two) times daily as needed. 23 g 5  . cholecalciferol (VITAMIN D3) 25 MCG (1000 UT) tablet Take 1,000 Units by mouth daily.    Marland Kitchen DEXILANT 60 MG capsule Take 60 mg by mouth daily.    . diazepam (VALIUM) 10 MG tablet Take 10 mg by mouth 2 (two) times daily as needed.    Marland Kitchen DILT-XR 240 MG 24 hr capsule TAKE 1 CAPSULE BY MOUTH EVERY DAY 90 capsule 1  . hydrochlorothiazide (MICROZIDE) 12.5 MG capsule TAKE 1 CAPSULE BY MOUTH EVERY DAY 90 capsule 0  . levofloxacin (LEVAQUIN) 750 MG tablet Take 750 mg by mouth daily.    Marland Kitchen lisinopril (ZESTRIL) 10 MG tablet TAKE 1 TABLET BY  MOUTH EVERY DAY 90 tablet 1  . Omega-3 Fatty Acids (FISH OIL) 1200 MG CAPS Take 1 capsule by mouth 2 (two) times daily.    . simvastatin (ZOCOR) 10 MG tablet TAKE 1 TABLET BY MOUTH EVERYDAY AT BEDTIME 90 tablet 1  . tadalafil (CIALIS) 5 MG tablet Take 5 mg by mouth daily as needed for erectile dysfunction.      Marland Kitchen ipratropium (ATROVENT) 0.06 % nasal spray 2 sprays per nostril 2-3 times daily as needed 15 mL 5   No current facility-administered medications for this visit.    No Known Allergies  I appreciate the opportunity to take part in Vandell's care. Please do not hesitate to contact me with questions.  Sincerely,   R. Edgar Frisk, MD

## 2019-10-01 NOTE — Assessment & Plan Note (Signed)
Currently with suboptimal control.  Continue appropriate allergen avoidance measures.  A refill prescription has been provided for azelastine/fluticasone nasal spray, 1-2 sprays per nostril 2 times daily as needed.  A prescription has been provided for ipratropium 0.06% nasal spray, 2 sprays per nostril 2 or 3 times daily as needed.  Nasal saline lavage (NeilMed) has been recommended as needed and prior to medicated nasal sprays along with instructions for proper administration.  If allergen avoidance measures and medications fail to adequately relieve symptoms, aeroallergen immunotherapy will be considered.

## 2019-10-01 NOTE — Assessment & Plan Note (Signed)
   Continue appropriate reflux lifestyle modifications and Dexilant as prescribed.  Famotidine (Pepcid) 20 mg twice daily as needed.  Follow-up with gastroenterologist as recommended.

## 2019-10-01 NOTE — Patient Instructions (Addendum)
Perennial allergic rhinitis Currently with suboptimal control.  Continue appropriate allergen avoidance measures.  A refill prescription has been provided for azelastine/fluticasone nasal spray, 1-2 sprays per nostril 2 times daily as needed.  A prescription has been provided for ipratropium 0.06% nasal spray, 2 sprays per nostril 2 or 3 times daily as needed.  Nasal saline lavage (NeilMed) has been recommended as needed and prior to medicated nasal sprays along with instructions for proper administration.  If allergen avoidance measures and medications fail to adequately relieve symptoms, aeroallergen immunotherapy will be considered.  GERD (gastroesophageal reflux disease)  Continue appropriate reflux lifestyle modifications and Dexilant as prescribed.  Famotidine (Pepcid) 20 mg twice daily as needed.  Follow-up with gastroenterologist as recommended.   Return in about 6 months (around 04/02/2020), or if symptoms worsen or fail to improve.

## 2019-10-14 ENCOUNTER — Other Ambulatory Visit: Payer: Self-pay | Admitting: Family Medicine

## 2019-10-14 MED ORDER — AMLODIPINE BESYLATE 5 MG PO TABS
5.0000 mg | ORAL_TABLET | Freq: Every day | ORAL | 3 refills | Status: DC
Start: 2019-10-14 — End: 2020-01-06

## 2019-10-25 NOTE — Progress Notes (Signed)
GU Location of Tumor / Histology: prostatic adenocarcinoma  If Prostate Cancer, Gleason Score is (3 + 4) and PSA is (6.09). Prostate volume: 41.94 grams.  Patient transferred here for work in 2020. Just prior to transfer from [facility] he discovered his PSA was elevated. Patient self referred to [facility] once settled. Patient reported only mild lower urinary tract symptoms that are not bothersome. This is his only elevated value.  08/02/2018 psa 4.69  Biopsies of prostate (if applicable) revealed:    Past/Anticipated interventions by urology, if any: prostate biopsy, prescribed cialis, referred for consideration of radiotherapy  Past/Anticipated interventions by medical oncology, if any: no  Weight changes, if any: no  Bowel/Bladder complaints, if any: Mild LUTS none bothersome. IPSS 2 related to nocturia. SHIM 21. Takes Cialis. Denies dysuria, hematuria, urinary leakage or incontinence.  Nausea/Vomiting, if any: denies  Pain issues, if any:  denies  SAFETY ISSUES:  Prior radiation? denies  Pacemaker/ICD? denies  Possible current pregnancy? no, male patient  Is the patient on methotrexate? denies  Current Complaints / other details:  69 year old male. Married with one son and two daughters. Sister with hx of breast ca. Stopped smoking in 1981.

## 2019-10-29 ENCOUNTER — Encounter: Payer: Self-pay | Admitting: Radiation Oncology

## 2019-10-29 ENCOUNTER — Ambulatory Visit
Admission: RE | Admit: 2019-10-29 | Discharge: 2019-10-29 | Disposition: A | Discharge status: Home / Self Care | Payer: Managed Care, Other (non HMO) | Source: Ambulatory Visit | Attending: Radiation Oncology | Admitting: Radiation Oncology

## 2019-10-29 ENCOUNTER — Other Ambulatory Visit: Payer: Self-pay

## 2019-10-29 ENCOUNTER — Ambulatory Visit: Payer: Managed Care, Other (non HMO) | Admitting: Radiation Oncology

## 2019-10-29 VITALS — Ht 74.0 in | Wt 245.0 lb

## 2019-10-29 DIAGNOSIS — C61 Malignant neoplasm of prostate: Secondary | ICD-10-CM | POA: Insufficient documentation

## 2019-10-29 HISTORY — DX: Malignant neoplasm of prostate: C61

## 2019-10-29 NOTE — Progress Notes (Signed)
Radiation Oncology         [phone] ________________________________  Initial Outpatient Consultation - Conducted via MyChart due to current COVID-19 concerns for limiting patient exposure  Name: Dean Guzman MRN: [id number]  Date: 10/29/2019  DOB: 1950/12/09  [id number], Crosby Oyster, DO  [place], Crosby Oyster*   REFERRING PHYSICIAN: Shelda Pal*  DIAGNOSIS: 69 y.o. gentleman with Stage T1c adenocarcinoma of the prostate with Gleason score of 3+4, and PSA of 6.09.    ICD-10-CM   1. Malignant neoplasm of prostate ([place])  C61     HISTORY OF PRESENT ILLNESS: Dean Guzman is a 69 y.o. male with a diagnosis of prostate cancer. He was noted to have an elevated PSA of 4.9 by his primary care physician in 10/2017, shortly after relocating to the [place] area from [place], [place].  He established care with Dr. Gloriann Loan on 12/27/2017 and digital rectal examination was performed at that time revealing asymmetry of right lobe as compared to the left but without discrete nodularity.  The patient proceeded to transrectal ultrasound with 12 biopsies of the prostate on 02/08/2018. The prostate volume measured 41.54 cc. Pathology at that time was entirely benign. PSA remained stable at 4.69 when repeated in 07/2018.  His PSA rose to 6.09 in 07/2019. DRE at that time showed a somewhat lobular prostate but still without discrete nodules. A prostate MRI was performed on 09/16/2019, which showed no high-grade carcinoma within the peripheral zone but there was a 6 cm lesion with restricted diffusion in the left transitional zone with early enhancement, indeterminate for carcinoma (PI-RADS 3).  The prostatic capsule remained  Intact with no evidence of seminal vesicle involvement or lymphadenopathy. Prostate volume was estimated at 29 g. He proceeded to have an MRI fusion biopsy of the prostate on 10/02/2019. The prostate volume measured 41.94 cc on ultrasound and out of 16 core biopsies, 2 were  positive. None of the MRI ROI samples showed malignancy, but there was a maximum Gleason score of 3+4 seen in 5% of the left mid core and Gleason 3+3 noted in the left apex.  The patient reviewed the biopsy results with his urologist and he has kindly been referred today for discussion of potential radiation treatment options.   PREVIOUS RADIATION THERAPY: No  PAST MEDICAL HISTORY:  Past Medical History:  Diagnosis Date  . BPH (benign prostatic hyperplasia)   . Colon polyps   . Frequent headaches   . GERD (gastroesophageal reflux disease)   . Hyperlipidemia   . Hypertension   . Kidney stones   . Migraines   . Prostate cancer ([place] [place])       PAST SURGICAL HISTORY: Past Surgical History:  Procedure Laterality Date  . PROSTATE BIOPSY      FAMILY HISTORY:  Family History  Problem Relation Age of Onset  . Arthritis Mother   . Heart disease Father   . Hyperlipidemia Father   . Hypertension Father   . Breast cancer Sister   . Diabetes Maternal Grandmother   . Hearing loss Paternal Grandmother   . Early death Paternal Grandmother   . Hearing loss Paternal Grandfather   . Early death Paternal Grandfather   . Allergic rhinitis Neg Hx   . Asthma Neg Hx   . Eczema Neg Hx   . Urticaria Neg Hx   . Prostate cancer Neg Hx   . Colon cancer Neg Hx   . Pancreatic cancer Neg Hx     SOCIAL HISTORY:  Social History   Socioeconomic  History  . Marital status: Married    Spouse name: Not on file  . Number of children: 3  . Years of education: Not on file  . Highest education level: Not on file  Occupational History    Comment: full time  Tobacco Use  . Smoking status: Former Smoker    Packs/day: 1.00    Years: 12.00    Pack years: 12.00    Types: Cigarettes    Quit date: 05/30/1978    Years since quitting: 41.4  . Smokeless tobacco: Never Used  Substance and Sexual Activity  . Alcohol use: Not Currently    Comment: rarely  . Drug use: Never  . Sexual activity: Yes  Other  Topics Concern  . Not on file  Social History Narrative  . Not on file   Social Determinants of Health   Financial Resource Strain:   . Difficulty of Paying Living Expenses:   Food Insecurity:   . Worried About Charity fundraiser in the Last Year:   . Arboriculturist in the Last Year:   Transportation Needs:   . Film/video editor (Medical):   Marland Kitchen Lack of Transportation (Non-Medical):   Physical Activity:   . Days of Exercise per Week:   . Minutes of Exercise per Session:   Stress:   . Feeling of Stress :   Social Connections:   . Frequency of Communication with Friends and Family:   . Frequency of Social Gatherings with Friends and Family:   . Attends Religious Services:   . Active Member of Clubs or Organizations:   . Attends Archivist Meetings:   Marland Kitchen Marital Status:   Intimate Partner Violence:   . Fear of Current or Ex-Partner:   . Emotionally Abused:   Marland Kitchen Physically Abused:   . Sexually Abused:     ALLERGIES: Patient has no known allergies.  MEDICATIONS:  Current Outpatient Medications  Medication Sig Dispense Refill  . amLODipine (NORVASC) 5 MG tablet Take 1 tablet (5 mg total) by mouth daily. 30 tablet 3  . aspirin EC 81 MG tablet Take 81 mg by mouth daily.    . Azelastine-Fluticasone 137-50 MCG/ACT SUSP Place 2 sprays into the nose 2 (two) times daily as needed. 23 g 5  . cholecalciferol (VITAMIN D3) 25 MCG (1000 UT) tablet Take 1,000 Units by mouth daily.    Marland Kitchen DEXILANT 60 MG capsule Take 60 mg by mouth daily.    Marland Kitchen DILT-XR 240 MG 24 hr capsule TAKE 1 CAPSULE BY MOUTH EVERY DAY 90 capsule 1  . hydrochlorothiazide (MICROZIDE) 12.5 MG capsule TAKE 1 CAPSULE BY MOUTH EVERY DAY 90 capsule 0  . ipratropium (ATROVENT) 0.06 % nasal spray 2 sprays per nostril 2-3 times daily as needed 15 mL 5  . lisinopril (ZESTRIL) 10 MG tablet TAKE 1 TABLET BY MOUTH EVERY DAY 90 tablet 1  . Omega-3 Fatty Acids (FISH OIL) 1200 MG CAPS Take 1 capsule by mouth 2 (two) times  daily.    . simvastatin (ZOCOR) 10 MG tablet TAKE 1 TABLET BY MOUTH EVERYDAY AT BEDTIME 90 tablet 1  . tadalafil (CIALIS) 5 MG tablet Take 5 mg by mouth daily as needed for erectile dysfunction.     No current facility-administered medications for this encounter.    REVIEW OF SYSTEMS:  On review of systems, the patient reports that he is doing well overall. He denies any chest pain, shortness of breath, cough, fevers, chills, night sweats, or unintended weight  changes. He denies any bowel disturbances, and denies abdominal pain, nausea or vomiting. He denies any new musculoskeletal or joint aches or pains. His IPSS was 2, indicating very mild urinary symptoms. His only complaint is nocturia. His SHIM was 21 with cialis, indicating he controlled erectile dysfunction. A complete review of systems is obtained and is otherwise negative.    PHYSICAL EXAM:  Wt Readings from Last 3 Encounters:  10/29/19 245 lb (111.1 kg)  10/01/19 256 lb (116.1 kg)  09/24/19 251 lb 2 oz (113.9 kg)   Temp Readings from Last 3 Encounters:  10/01/19 97.9 F (36.6 C) (Temporal)  09/24/19 (!) 96.1 F (35.6 C) (Temporal)  06/14/19 (!) 97 F (36.1 C) (Temporal)   BP Readings from Last 3 Encounters:  10/01/19 140/76  09/24/19 (!) 144/80  06/14/19 120/70   Pulse Readings from Last 3 Encounters:  10/01/19 (!) 59  09/24/19 61  06/14/19 63   Pain Assessment Pain Score: 0-No pain/10  In general this is a well appearing Caucasian gentleman in no acute distress. He's alert and oriented x4 and appropriate throughout the examination. Cardiopulmonary assessment is negative for acute distress and he exhibits normal effort.   KPS = 100  100 - Normal; no complaints; no evidence of disease. 90   - Able to carry on normal activity; minor signs or symptoms of disease. 80   - Normal activity with effort; some signs or symptoms of disease. 64   - Cares for self; unable to carry on normal activity or to do active work. 60    - Requires occasional assistance, but is able to care for most of his personal needs. 50   - Requires considerable assistance and frequent medical care. 32   - Disabled; requires special care and assistance. 70   - Severely disabled; hospital admission is indicated although death not imminent. 76   - Very sick; hospital admission necessary; active supportive treatment necessary. 10   - Moribund; fatal processes progressing rapidly. 0     - Dead  Karnofsky DA, Abelmann Winlock, Craver LS and Burchenal [facility] [phone]) The use of the nitrogen mustards in the palliative treatment of carcinoma: with particular reference to bronchogenic carcinoma Cancer 1 634-56  LABORATORY DATA:  Lab Results  Component Value Date   WBC 8.9 07/31/2018   HGB 16.5 07/31/2018   HCT 47.8 07/31/2018   MCV 87.7 07/31/2018   PLT 228 07/31/2018   Lab Results  Component Value Date   NA 135 06/14/2019   K 4.2 06/14/2019   CL 98 06/14/2019   CO2 30 06/14/2019   Lab Results  Component Value Date   ALT 30 06/14/2019   AST 24 06/14/2019   ALKPHOS 63 06/14/2019   BILITOT 0.6 06/14/2019     RADIOGRAPHY: No results found.    IMPRESSION/PLAN: This visit was conducted via MyChart to spare the patient unnecessary potential exposure in the healthcare setting during the current COVID-19 pandemic. 1. 69 y.o. gentleman with Stage T1c adenocarcinoma of the prostate with Gleason Score of 3+4, and PSA of 6.09. We discussed the patient's workup and outlined the nature of prostate cancer in this setting. The patient's T stage, Gleason's score, and PSA put him into the favorable intermediate risk group. Accordingly, he is eligible for a variety of potential treatment options including brachytherapy, 5.5 weeks of external radiation, or prostatectomy. We discussed the available radiation techniques, and focused on the details and logistics of delivery. We discussed and outlined the risks, benefits, short and  long-term effects associated with  radiotherapy and compared and contrasted these with prostatectomy. We discussed the role of SpaceOAR in reducing the rectal toxicity associated with radiotherapy.  He and his wife were encouraged to ask questions that were answered to their stated satisfaction.  At the end of the conversation, the patient remains undecided regarding his final treatment preference, deciding between prostatectomy versus brachytherapy.  He and his wife would like to take some additional time to consider these options and plan to reach a final decision in the next 4 to 6 weeks in anticipation of proceeding with treatment in August or September 2021. We will share our discussion with Dr. Gloriann Loan and look forward to hearing from the patient in the near future regarding his final decision.  We enjoyed meeting him and his wife today and would be more than happy to continue to participate in his care should he elect to proceed with radiotherapy.  He knows that he is welcome to call at any time with any further questions or concerns related to radiation.   Given current concerns for patient exposure during the COVID-19 pandemic, this encounter was conducted via video-enabled MyChart visit. The patient has given verbal consent for this type of encounter. The time spent during this encounter was 75 minutes. The attendants for this meeting include Tyler Pita MD, Ashlyn Bruning PA-C, [place], and patient, Lindsay Gibeault and his wife, Arrie Aran. During the encounter, Tyler Pita MD, Ashlyn Bruning PA-C, and scribe, Wilburn Mylar were located at [facility].  Patient, Miklos Steely and he is wife, Arrie Aran were located at home.    Nicholos Johns, PA-C    Tyler Pita, MD  [facility] Direct Dial: [phone]  Fax: [phone] [email or web address]  Skype  [email or web address]  This document serves as a record of services personally performed by  Tyler Pita, MD and Freeman Caldron, PA-C. It was created on their behalf by Wilburn Mylar, a trained medical scribe. The creation of this record is based on the scribe's personal observations and the provider's statements to them. This document has been checked and approved by the attending provider.

## 2019-11-05 ENCOUNTER — Encounter: Payer: Self-pay | Admitting: Medical Oncology

## 2019-11-05 NOTE — Progress Notes (Signed)
Spoke with patient to introduce myself as the prostate nurse navigator and discuss my role. I was unable to meet him 6/1, when he consulted with Dr. Tammi Klippel. He is undecided on treatment but leaning towards brachytherapy. He has an appointment with Dr. Gloriann Loan 6/15, to further discuss his options. I gave him my contact information and asked him to call me with questions or concerns. He voiced understanding.

## 2019-11-14 ENCOUNTER — Other Ambulatory Visit: Payer: Self-pay | Admitting: Urology

## 2019-11-15 ENCOUNTER — Telehealth: Payer: Self-pay | Admitting: *Deleted

## 2019-11-15 NOTE — Telephone Encounter (Signed)
Called patient to inform of pre-seed appts. and implant, spoke with patient and he is aware of these appts 

## 2019-11-19 ENCOUNTER — Ambulatory Visit: Payer: Managed Care, Other (non HMO) | Admitting: Family Medicine

## 2019-11-28 ENCOUNTER — Encounter: Payer: Self-pay | Admitting: Medical Oncology

## 2019-12-13 ENCOUNTER — Ambulatory Visit: Payer: Managed Care, Other (non HMO) | Admitting: Family Medicine

## 2019-12-14 ENCOUNTER — Other Ambulatory Visit: Payer: Self-pay | Admitting: Family Medicine

## 2019-12-18 ENCOUNTER — Telehealth: Payer: Self-pay | Admitting: *Deleted

## 2019-12-18 NOTE — Telephone Encounter (Signed)
Called patient to remind of pre-seed appts. for 12-19-19, spoke with patient and he is aware of these appts.

## 2019-12-19 ENCOUNTER — Encounter: Payer: Self-pay | Admitting: Medical Oncology

## 2019-12-19 ENCOUNTER — Ambulatory Visit (HOSPITAL_COMMUNITY)
Admission: RE | Admit: 2019-12-19 | Discharge: 2019-12-19 | Disposition: A | Discharge status: Home / Self Care | Payer: Managed Care, Other (non HMO) | Source: Ambulatory Visit | Attending: Urology | Admitting: Urology

## 2019-12-19 ENCOUNTER — Ambulatory Visit
Admission: RE | Admit: 2019-12-19 | Discharge: 2019-12-19 | Disposition: A | Discharge status: Home / Self Care | Payer: Managed Care, Other (non HMO) | Source: Ambulatory Visit | Attending: Radiation Oncology | Admitting: Radiation Oncology

## 2019-12-19 ENCOUNTER — Encounter (HOSPITAL_COMMUNITY)
Admission: RE | Admit: 2019-12-19 | Discharge: 2019-12-19 | Disposition: A | Discharge status: Home / Self Care | Payer: Managed Care, Other (non HMO) | Source: Ambulatory Visit | Attending: Urology | Admitting: Urology

## 2019-12-19 ENCOUNTER — Ambulatory Visit
Admission: RE | Admit: 2019-12-19 | Discharge: 2019-12-19 | Disposition: A | Discharge status: Home / Self Care | Payer: Managed Care, Other (non HMO) | Source: Ambulatory Visit | Attending: Urology | Admitting: Urology

## 2019-12-19 ENCOUNTER — Other Ambulatory Visit: Payer: Self-pay

## 2019-12-19 DIAGNOSIS — C61 Malignant neoplasm of prostate: Secondary | ICD-10-CM

## 2019-12-22 NOTE — Progress Notes (Signed)
  Radiation Oncology         [phone]) [phone] ________________________________  Name: Dean Guzman MRN: [id number]  Date: 12/19/2019  DOB: 15-May-1951  SIMULATION AND TREATMENT PLANNING NOTE PUBIC ARCH STUDY  [id number], Crosby Oyster, DO  Nani Ravens, Crosby Oyster*  DIAGNOSIS: 69 y.o. gentleman with Stage T1c adenocarcinoma of the prostate with Gleason score of 3+4, and PSA of 6.09 Oncology History  Malignant neoplasm of prostate ([place])  10/02/2019 Cancer Staging   Staging form: Prostate, AJCC 8th Edition - Clinical stage from 10/02/2019: Stage IIB (cT1c, cN0, cM0, PSA: 6.1, Grade Group: 2) - Signed by Freeman Caldron, PA-C on 10/29/2019   10/29/2019 Initial Diagnosis   Malignant neoplasm of prostate ([place])       ICD-10-CM   1. Malignant neoplasm of prostate ([place])  C61     COMPLEX SIMULATION:  The patient presented today for evaluation for possible prostate seed implant. He was brought to the radiation planning suite and placed supine on the CT couch. A 3-dimensional image study set was obtained in upload to the planning computer. There, on each axial slice, I contoured the prostate gland. Then, using three-dimensional radiation planning tools I reconstructed the prostate in view of the structures from the transperineal needle pathway to assess for possible pubic arch interference. In doing so, I did not appreciate any pubic arch interference. Also, the patient's prostate volume was estimated based on the drawn structure. The volume was 42 cc.  Given the pubic arch appearance and prostate volume, patient remains a good candidate to proceed with prostate seed implant. Today, he freely provided informed written consent to proceed.    PLAN: The patient will undergo prostate seed implant.   ________________________________  Sheral Apley. Tammi Klippel, M.D.

## 2020-01-01 ENCOUNTER — Encounter (HOSPITAL_BASED_OUTPATIENT_CLINIC_OR_DEPARTMENT_OTHER): Payer: Self-pay | Admitting: Urology

## 2020-01-01 ENCOUNTER — Other Ambulatory Visit: Payer: Self-pay

## 2020-01-01 NOTE — Progress Notes (Signed)
Spoke w/ via phone for pre-op interview---PT Labs dos---- none              Lab results------has lab appt 01-09-2020 930 am COVID test ------01-09-20 at 1030 am Arrive at -------930 am 01-13-20 NPO after MN NO Solid Food.  Clear liquids from MN until---830 am then npo Medications to take morning of surgery -----amlodipine, diltiazem, dexilant,  Diabetic medication -----n/a Patient Special Instructions -----fleets enema am of surgery Pre-Op special Istructions -----none Patient verbalized understanding of instructions that were given at this phone interview. Patient denies shortness of breath, chest pain, fever, cough at this phone interview.

## 2020-01-06 ENCOUNTER — Other Ambulatory Visit: Payer: Self-pay | Admitting: Family Medicine

## 2020-01-06 ENCOUNTER — Telehealth: Payer: Self-pay | Admitting: *Deleted

## 2020-01-06 NOTE — Telephone Encounter (Signed)
Called patient to remind of lab and COVID testing for 01-09-20, spoke with patient and he is aware of these appts.

## 2020-01-09 ENCOUNTER — Other Ambulatory Visit (HOSPITAL_COMMUNITY)
Admission: RE | Admit: 2020-01-09 | Discharge: 2020-01-09 | Disposition: A | Discharge status: Home / Self Care | Payer: Managed Care, Other (non HMO) | Source: Ambulatory Visit | Attending: Urology | Admitting: Urology

## 2020-01-09 ENCOUNTER — Encounter (HOSPITAL_COMMUNITY)
Admission: RE | Admit: 2020-01-09 | Discharge: 2020-01-09 | Disposition: A | Discharge status: Home / Self Care | Payer: Managed Care, Other (non HMO) | Source: Ambulatory Visit | Attending: Urology | Admitting: Urology

## 2020-01-09 ENCOUNTER — Other Ambulatory Visit: Payer: Self-pay

## 2020-01-09 DIAGNOSIS — Z01812 Encounter for preprocedural laboratory examination: Secondary | ICD-10-CM | POA: Insufficient documentation

## 2020-01-09 DIAGNOSIS — Z20822 Contact with and (suspected) exposure to covid-19: Secondary | ICD-10-CM | POA: Insufficient documentation

## 2020-01-09 LAB — PROTIME-INR
INR: 1 (ref 0.8–1.2)
Prothrombin Time: 13.1 seconds (ref 11.4–15.2)

## 2020-01-09 LAB — COMPREHENSIVE METABOLIC PANEL
ALT: 25 U/L (ref 0–44)
AST: 20 U/L (ref 15–41)
Albumin: 4 g/dL (ref 3.5–5.0)
Alkaline Phosphatase: 56 U/L (ref 38–126)
Anion gap: 7 (ref 5–15)
BUN: 22 mg/dL (ref 8–23)
CO2: 27 mmol/L (ref 22–32)
Calcium: 9 mg/dL (ref 8.9–10.3)
Chloride: 101 mmol/L (ref 98–111)
Creatinine, Ser: 1.26 mg/dL — ABNORMAL HIGH (ref 0.61–1.24)
GFR calc Af Amer: >60 mL/min (ref >60)
GFR calc non Af Amer: 58 mL/min — ABNORMAL LOW (ref >60)
Glucose, Bld: 138 mg/dL — ABNORMAL HIGH (ref 70–99)
Potassium: 4.6 mmol/L (ref 3.5–5.1)
Sodium: 135 mmol/L (ref 135–145)
Total Bilirubin: 0.4 mg/dL (ref 0.3–1.2)
Total Protein: 7 g/dL (ref 6.5–8.1)

## 2020-01-09 LAB — SARS CORONAVIRUS 2 (TAT 6-24 HRS): SARS Coronavirus 2: NEGATIVE

## 2020-01-09 LAB — CBC
HCT: 45.5 % (ref 39.0–52.0)
Hemoglobin: 14.9 g/dL (ref 13.0–17.0)
MCH: 29.5 pg (ref 26.0–34.0)
MCHC: 32.7 g/dL (ref 30.0–36.0)
MCV: 90.1 fL (ref 80.0–100.0)
Platelets: 173 10*3/uL (ref 150–400)
RBC: 5.05 MIL/uL (ref 4.22–5.81)
RDW: 13.1 % (ref 11.5–15.5)
WBC: 5.1 10*3/uL (ref 4.0–10.5)
nRBC: 0 % (ref 0.0–0.2)

## 2020-01-09 LAB — APTT: aPTT: 31 seconds (ref 24–36)

## 2020-01-10 ENCOUNTER — Telehealth: Payer: Self-pay | Admitting: *Deleted

## 2020-01-10 NOTE — Telephone Encounter (Signed)
CALLED PATIENT TO REMIND OF PROCEDURE FOR 01-13-20, SPOKE WITH PATIENT AND HE IS AWARE OF THIS PROCEDURE

## 2020-01-13 ENCOUNTER — Ambulatory Visit (HOSPITAL_BASED_OUTPATIENT_CLINIC_OR_DEPARTMENT_OTHER)
Admission: RE | Admit: 2020-01-13 | Discharge: 2020-01-13 | Disposition: A | Discharge status: Home / Self Care | Payer: Managed Care, Other (non HMO) | Attending: Urology | Admitting: Urology

## 2020-01-13 ENCOUNTER — Ambulatory Visit (HOSPITAL_COMMUNITY): Payer: Managed Care, Other (non HMO)

## 2020-01-13 ENCOUNTER — Encounter (HOSPITAL_BASED_OUTPATIENT_CLINIC_OR_DEPARTMENT_OTHER): Payer: Self-pay | Admitting: Urology

## 2020-01-13 ENCOUNTER — Other Ambulatory Visit: Payer: Self-pay

## 2020-01-13 ENCOUNTER — Ambulatory Visit (HOSPITAL_BASED_OUTPATIENT_CLINIC_OR_DEPARTMENT_OTHER): Payer: Managed Care, Other (non HMO) | Admitting: Anesthesiology

## 2020-01-13 ENCOUNTER — Encounter (HOSPITAL_BASED_OUTPATIENT_CLINIC_OR_DEPARTMENT_OTHER)
Admission: RE | Disposition: A | Discharge status: Home / Self Care | Payer: Self-pay | Source: Home / Self Care | Attending: Urology

## 2020-01-13 DIAGNOSIS — N401 Enlarged prostate with lower urinary tract symptoms: Secondary | ICD-10-CM | POA: Diagnosis not present

## 2020-01-13 DIAGNOSIS — Z87891 Personal history of nicotine dependence: Secondary | ICD-10-CM | POA: Diagnosis not present

## 2020-01-13 DIAGNOSIS — E7800 Pure hypercholesterolemia, unspecified: Secondary | ICD-10-CM | POA: Insufficient documentation

## 2020-01-13 DIAGNOSIS — Z7982 Long term (current) use of aspirin: Secondary | ICD-10-CM | POA: Insufficient documentation

## 2020-01-13 DIAGNOSIS — K219 Gastro-esophageal reflux disease without esophagitis: Secondary | ICD-10-CM | POA: Insufficient documentation

## 2020-01-13 DIAGNOSIS — Z6832 Body mass index (BMI) 32.0-32.9, adult: Secondary | ICD-10-CM | POA: Insufficient documentation

## 2020-01-13 DIAGNOSIS — J449 Chronic obstructive pulmonary disease, unspecified: Secondary | ICD-10-CM | POA: Diagnosis not present

## 2020-01-13 DIAGNOSIS — E669 Obesity, unspecified: Secondary | ICD-10-CM | POA: Insufficient documentation

## 2020-01-13 DIAGNOSIS — R351 Nocturia: Secondary | ICD-10-CM | POA: Insufficient documentation

## 2020-01-13 DIAGNOSIS — C61 Malignant neoplasm of prostate: Secondary | ICD-10-CM | POA: Insufficient documentation

## 2020-01-13 DIAGNOSIS — R3915 Urgency of urination: Secondary | ICD-10-CM | POA: Insufficient documentation

## 2020-01-13 DIAGNOSIS — I10 Essential (primary) hypertension: Secondary | ICD-10-CM | POA: Diagnosis not present

## 2020-01-13 DIAGNOSIS — Z79899 Other long term (current) drug therapy: Secondary | ICD-10-CM | POA: Diagnosis not present

## 2020-01-13 HISTORY — DX: Personal history of urinary calculi: Z87.442

## 2020-01-13 HISTORY — DX: Family history of diseases of the blood and blood-forming organs and certain disorders involving the immune mechanism: Z83.2

## 2020-01-13 HISTORY — PX: CYSTOSCOPY: SHX5120

## 2020-01-13 HISTORY — PX: RADIOACTIVE SEED IMPLANT: SHX5150

## 2020-01-13 HISTORY — DX: Barrett's esophagus without dysplasia: K22.70

## 2020-01-13 HISTORY — PX: SPACE OAR INSTILLATION: SHX6769

## 2020-01-13 SURGERY — INSERTION, RADIATION SOURCE, PROSTATE
Anesthesia: General | Site: Rectum

## 2020-01-13 MED ORDER — CIPROFLOXACIN IN D5W 400 MG/200ML IV SOLN
INTRAVENOUS | Status: AC
  Filled 2020-01-13: qty 200

## 2020-01-13 MED ORDER — PROPOFOL 10 MG/ML IV BOLUS
INTRAVENOUS | Status: AC
  Filled 2020-01-13: qty 20

## 2020-01-13 MED ORDER — CIPROFLOXACIN IN D5W 400 MG/200ML IV SOLN
400.0000 mg | INTRAVENOUS | Status: AC
  Administered 2020-01-13: 400 mg via INTRAVENOUS

## 2020-01-13 MED ORDER — LIDOCAINE 2% (20 MG/ML) 5 ML SYRINGE
INTRAMUSCULAR | Status: AC
  Filled 2020-01-13: qty 5

## 2020-01-13 MED ORDER — SODIUM CHLORIDE (PF) 0.9 % IJ SOLN
INTRAMUSCULAR | Status: DC | PRN
  Administered 2020-01-13: 10 mL

## 2020-01-13 MED ORDER — MIDAZOLAM HCL 2 MG/2ML IJ SOLN
INTRAMUSCULAR | Status: AC
  Filled 2020-01-13: qty 2

## 2020-01-13 MED ORDER — FLEET ENEMA 7-19 GM/118ML RE ENEM: 1.0000 | ENEMA | Freq: Once | RECTAL | Status: DC

## 2020-01-13 MED ORDER — ONDANSETRON HCL 4 MG/2ML IJ SOLN
INTRAMUSCULAR | Status: AC
  Filled 2020-01-13: qty 2

## 2020-01-13 MED ORDER — DEXAMETHASONE SODIUM PHOSPHATE 4 MG/ML IJ SOLN
INTRAMUSCULAR | Status: DC | PRN
  Administered 2020-01-13: 10 mg via INTRAVENOUS

## 2020-01-13 MED ORDER — DEXAMETHASONE SODIUM PHOSPHATE 10 MG/ML IJ SOLN
INTRAMUSCULAR | Status: AC
  Filled 2020-01-13: qty 1

## 2020-01-13 MED ORDER — HYDROCODONE-ACETAMINOPHEN 5-325 MG PO TABS: 1.0000 | ORAL_TABLET | ORAL | 0 refills | Status: DC | PRN

## 2020-01-13 MED ORDER — LIDOCAINE HCL (CARDIAC) PF 100 MG/5ML IV SOSY
PREFILLED_SYRINGE | INTRAVENOUS | Status: DC | PRN
  Administered 2020-01-13: 40 mg via INTRAVENOUS

## 2020-01-13 MED ORDER — KETOROLAC TROMETHAMINE 30 MG/ML IJ SOLN
INTRAMUSCULAR | Status: DC | PRN
  Administered 2020-01-13: 30 mg via INTRAVENOUS

## 2020-01-13 MED ORDER — SODIUM CHLORIDE 0.9 % IR SOLN
Status: DC | PRN
  Administered 2020-01-13: 1000 mL via INTRAVESICAL

## 2020-01-13 MED ORDER — MIDAZOLAM HCL 5 MG/5ML IJ SOLN
INTRAMUSCULAR | Status: DC | PRN
  Administered 2020-01-13: 2 mg via INTRAVENOUS

## 2020-01-13 MED ORDER — MEPERIDINE HCL 25 MG/ML IJ SOLN: 6.2500 mg | INTRAMUSCULAR | Status: DC | PRN

## 2020-01-13 MED ORDER — FENTANYL CITRATE (PF) 100 MCG/2ML IJ SOLN: 25.0000 ug | INTRAMUSCULAR | Status: DC | PRN

## 2020-01-13 MED ORDER — LACTATED RINGERS IV SOLN: INTRAVENOUS | Status: DC

## 2020-01-13 MED ORDER — KETOROLAC TROMETHAMINE 30 MG/ML IJ SOLN
INTRAMUSCULAR | Status: AC
  Filled 2020-01-13: qty 1

## 2020-01-13 MED ORDER — FENTANYL CITRATE (PF) 100 MCG/2ML IJ SOLN
INTRAMUSCULAR | Status: AC
  Filled 2020-01-13: qty 2

## 2020-01-13 MED ORDER — FENTANYL CITRATE (PF) 100 MCG/2ML IJ SOLN
INTRAMUSCULAR | Status: DC | PRN
  Administered 2020-01-13: 25 ug via INTRAVENOUS
  Administered 2020-01-13: 75 ug via INTRAVENOUS
  Administered 2020-01-13 (×2): 50 ug via INTRAVENOUS

## 2020-01-13 MED ORDER — PROMETHAZINE HCL 25 MG/ML IJ SOLN: 6.2500 mg | INTRAMUSCULAR | Status: DC | PRN

## 2020-01-13 MED ORDER — ACETAMINOPHEN 500 MG PO TABS: 1000.0000 mg | ORAL_TABLET | Freq: Once | ORAL | Status: DC

## 2020-01-13 MED ORDER — MIDAZOLAM HCL 2 MG/2ML IJ SOLN: 0.5000 mg | Freq: Once | INTRAMUSCULAR | Status: DC | PRN

## 2020-01-13 MED ORDER — PROPOFOL 10 MG/ML IV BOLUS
INTRAVENOUS | Status: DC | PRN
  Administered 2020-01-13 (×2): 50 mg via INTRAVENOUS
  Administered 2020-01-13: 160 mg via INTRAVENOUS
  Administered 2020-01-13: 40 mg via INTRAVENOUS

## 2020-01-13 MED ORDER — ONDANSETRON HCL 4 MG/2ML IJ SOLN
INTRAMUSCULAR | Status: DC | PRN
  Administered 2020-01-13: 4 mg via INTRAVENOUS

## 2020-01-13 MED ORDER — IOHEXOL 300 MG/ML  SOLN
INTRAMUSCULAR | Status: DC | PRN
  Administered 2020-01-13: 7 mL

## 2020-01-13 SURGICAL SUPPLY — 40 items
BAG DRN RND TRDRP ANRFLXCHMBR (UROLOGICAL SUPPLIES) ×3
BAG URINE DRAIN 2000ML AR STRL (UROLOGICAL SUPPLIES) ×4 IMPLANT
BLADE CLIPPER SENSICLIP SURGIC (BLADE) ×4 IMPLANT
CATH FOLEY 2WAY SLVR  5CC 16FR (CATHETERS) ×1
CATH FOLEY 2WAY SLVR 5CC 16FR (CATHETERS) ×3 IMPLANT
CATH ROBINSON RED A/P 16FR (CATHETERS) IMPLANT
CATH ROBINSON RED A/P 20FR (CATHETERS) ×4 IMPLANT
CLOTH BEACON ORANGE TIMEOUT ST (SAFETY) ×4 IMPLANT
CNTNR URN SCR LID CUP LEK RST (MISCELLANEOUS) ×6 IMPLANT
CONT SPEC 4OZ STRL OR WHT (MISCELLANEOUS) ×8
COVER BACK TABLE 60X90IN (DRAPES) ×4 IMPLANT
COVER MAYO STAND STRL (DRAPES) ×4 IMPLANT
DRAPE OEC MINIVIEW 54X84 (DRAPES) ×4 IMPLANT
DRSG TEGADERM 4X4.75 (GAUZE/BANDAGES/DRESSINGS) ×4 IMPLANT
DRSG TEGADERM 8X12 (GAUZE/BANDAGES/DRESSINGS) ×8 IMPLANT
GAUZE SPONGE 4X4 12PLY STRL (GAUZE/BANDAGES/DRESSINGS) ×4 IMPLANT
GLOVE BIO SURGEON STRL SZ 6.5 (GLOVE) ×8 IMPLANT
GLOVE BIO SURGEON STRL SZ7.5 (GLOVE) ×4 IMPLANT
GLOVE BIOGEL PI IND STRL 7.5 (GLOVE) ×15 IMPLANT
GLOVE BIOGEL PI INDICATOR 7.5 (GLOVE) ×5
GLOVE ECLIPSE 7.5 STRL STRAW (GLOVE) ×8 IMPLANT
GLOVE SURG ORTHO 8.5 STRL (GLOVE) ×4 IMPLANT
GLOVE SURG SS PI 6.5 STRL IVOR (GLOVE) IMPLANT
GOWN STRL REUS W/TWL LRG LVL3 (GOWN DISPOSABLE) ×12 IMPLANT
GOWN STRL REUS W/TWL XL LVL3 (GOWN DISPOSABLE) ×4 IMPLANT
HOLDER FOLEY CATH W/STRAP (MISCELLANEOUS) IMPLANT
I- SEED AGX100 ×292 IMPLANT
IMPL SPACEOAR VUE SYSTEM (Spacer) ×3 IMPLANT
IMPLANT SPACEOAR VUE SYSTEM (Spacer) ×4 IMPLANT
IV NS 1000ML (IV SOLUTION) ×4
IV NS 1000ML BAXH (IV SOLUTION) ×3 IMPLANT
KIT TURNOVER CYSTO (KITS) ×4 IMPLANT
MARKER SKIN DUAL TIP RULER LAB (MISCELLANEOUS) ×4 IMPLANT
PACK CYSTO (CUSTOM PROCEDURE TRAY) ×4 IMPLANT
SURGILUBE 2OZ TUBE FLIPTOP (MISCELLANEOUS) IMPLANT
SUT BONE WAX W31G (SUTURE) IMPLANT
SYR 10ML LL (SYRINGE) ×4 IMPLANT
TOWEL OR 17X26 10 PK STRL BLUE (TOWEL DISPOSABLE) ×4 IMPLANT
UNDERPAD 30X36 HEAVY ABSORB (UNDERPADS AND DIAPERS) ×8 IMPLANT
WATER STERILE IRR 500ML POUR (IV SOLUTION) ×4 IMPLANT

## 2020-01-13 NOTE — Anesthesia Preprocedure Evaluation (Addendum)
Anesthesia Evaluation  Patient identified by MRN, date of birth, ID band Patient awake    Reviewed: Allergy & Precautions, NPO status , Patient's Chart, lab work & pertinent test results  History of Anesthesia Complications Negative for: history of anesthetic complications  Airway Mallampati: I  TM Distance: >3 FB Neck ROM: Full    Dental  (+) Dental Advisory Given   Pulmonary COPD,  COPD inhaler, former smoker,  01/09/2020 SARS coronavirus NEG   breath sounds clear to auscultation       Cardiovascular hypertension, Pt. on medications (-) angina Rhythm:Regular Rate:Normal     Neuro/Psych  Headaches,    GI/Hepatic Neg liver ROS, GERD  Medicated and Controlled,  Endo/Other  obese  Renal/GU Renal InsufficiencyRenal disease (creat 1.26)   Prostate cancer    Musculoskeletal   Abdominal (+) + obese,   Peds  Hematology negative hematology ROS (+)   Anesthesia Other Findings   Reproductive/Obstetrics                            Anesthesia Physical Anesthesia Plan  ASA: III  Anesthesia Plan: General   Post-op Pain Management:    Induction: Intravenous  PONV Risk Score and Plan: 2  Airway Management Planned: LMA  Additional Equipment: None  Intra-op Plan:   Post-operative Plan:   Informed Consent: I have reviewed the patients History and Physical, chart, labs and discussed the procedure including the risks, benefits and alternatives for the proposed anesthesia with the patient or authorized representative who has indicated his/her understanding and acceptance.     Dental advisory given  Plan Discussed with: CRNA and Surgeon  Anesthesia Plan Comments:        Anesthesia Quick Evaluation

## 2020-01-13 NOTE — Anesthesia Procedure Notes (Signed)
Procedure Name: LMA Insertion Date/Time: 01/13/2020 11:51 AM Performed by: Justice Rocher, CRNA Pre-anesthesia Checklist: Patient identified, Emergency Drugs available, Suction available, Patient being monitored and Timeout performed Patient Re-evaluated:Patient Re-evaluated prior to induction Oxygen Delivery Method: Circle system utilized Preoxygenation: Pre-oxygenation with 100% oxygen Induction Type: IV induction Ventilation: Mask ventilation without difficulty LMA: LMA inserted LMA Size: 5.0 Number of attempts: 1 Airway Equipment and Method: Bite block Placement Confirmation: positive ETCO2,  breath sounds checked- equal and bilateral and CO2 detector Tube secured with: Tape Dental Injury: Teeth and Oropharynx as per pre-operative assessment

## 2020-01-13 NOTE — H&P (Signed)
CC/HPI: Pt presents today for pre-operative history and physical exam in anticipation of brachytherapy and space oar placement by Dr. Gloriann Loan on 01/13/20. He is doing well and is without complaint. Pt denies F/C, HA, CP, SOB, N/V, diarrhea/constipation, back pain, flank pain, hematuria, and dysuria.   HX:   Cc: Prostate cancer  HPI:  08/09/2018  12/27/2017:  PSA trend:  05/14/2016: 3.3, 15% free PSA  11/06/2017: 4.9, 12% free PSA   Patient recently moved to the area. He recently had a PSA drawn that was elevated. He self-referred himself. Only mild lower urinary tract symptoms that are not bothersome. This is his only elevated value.   02/15/2018:  Patient has been doing well since prostate biopsy. TRUS size was 41.54. He did have some right-sided asymmetry on digital rectal exam which prompted our biopsy in addition to the mildly elevated PSA. Fortunately, biopsy was negative for any malignancy. All 12 cores were negative. Patient does complain about some lower urinary tract symptoms, the primary one being urgency. He has done research on all medications and would like to try Cialis 5 mg daily. He is worried about side effects from alpha blockers, anticholinergics, and myrbetriq.   08/09/2018  PSA on 08/02/2018 was 4.69. Urinalysis was negative. He continues to have some urinary urgency and mild nocturia 2 but he is not too bothered by this. Not bothersome enough to try medication. PSA is stable.   08/14/2019  Most recent PSA was slightly increased and is now 6.09. He continues to take Cialis 5 mg daily and this has helped with his BPH symptoms. He has minimal symptoms now that are not bothersome. He would like to proceed with MRI for further evaluation of the elevated PSA.   09/18/2019  Patient underwent an MRI of the prostate. This revealed prostate size of 29 g. Within the left transition zone, he had a 6 x 6 cm lesion that was classified as a PIRADs 3 lesion.   10/10/2019  Patient doing well  since prostate biopsy. As mentioned above, PSA increased from 4.69 up to 6.09 which prompted an MRI of the prostate that revealed a 29 g prostate with a PIRADs 3 lesion. Targeted biopsy was negative for tumor. However, random biopsy did reveal a core of 3+4 adenocarcinoma of the prostate in 5% and a core of 3+3 adenocarcinoma of the prostate in 10% of 1 core. Previous biopsy was benign. He does not have a history of abdominal surgeries. He denies a history of significant cardiovascular disease, MI, stent placements. Last AUA symptom score was 5. Prostate exam is benign.   11/12/2019  In the interval, the patient met with Dr. Tammi Klippel. He has elected to proceed with brachytherapy.     ALLERGIES: None   MEDICATIONS: Cialis 5 mg tablet 1 tablet PO Daily  Hydrochlorothiazide 12.5 mg capsule  Lisinopril 10 mg tablet  Simvastatin 20 mg tablet  Amlodipine Besylate 5 mg tablet  Aspir 81  Azelastine Hcl  Carbinoxamine Maleate 4 mg tablet  Centrum Silver  Dexilant  Diltiazem 24Hr Er (Cd) 240 mg capsule, ext release 24 hr  Fish Oil  Ipratropium Bromide 42 mcg (0.06 %) aerosol, spray     GU PSH: Prostate Needle Biopsy - 10/02/2019, 02/08/2018 Vasectomy       PSH Notes: Vasectomy reversal  Wisdom teeth   NON-GU PSH: Eye Surgery Procedure Surgical Pathology, Gross And Microscopic Examination For Prostate Needle - 10/02/2019, 02/08/2018     GU PMH: Prostate Cancer - 10/10/2019 Nocturia - 08/14/2019 BPH  w/LUTS - 02/15/2018 Urinary Urgency - 02/15/2018 Elevated PSA - 02/08/2018, - 2019 BPH w/o LUTS - 2019 Renal calculus      PMH Notes: Hiatal hernia  Barrett's esophagus   NON-GU PMH: GERD Hypercholesterolemia Hypertension    FAMILY HISTORY: 2 daughters - Other 1 son - Other Arthritis - Father Breast Cancer - Sister Heart Disease - Mother   SOCIAL HISTORY: Marital Status: Married Preferred Language: English; Race: White Current Smoking Status: Patient does not smoke anymore. Has not  smoked since 11/28/1979.   Tobacco Use Assessment Completed: Used Tobacco in last 30 days? Does not use smokeless tobacco. Does drink.  Does not use drugs. Drinks 2 caffeinated drinks per day. Has not had a blood transfusion.     Notes: ETOH 1 drink per week    REVIEW OF SYSTEMS:    GU Review Male:   Patient denies trouble starting your stream, erection problems, get up at night to urinate, hard to postpone urination, have to strain to urinate , penile pain, leakage of urine, burning/ pain with urination, frequent urination, and stream starts and stops.  Gastrointestinal (Upper):   Patient denies nausea, vomiting, and indigestion/ heartburn.  Gastrointestinal (Lower):   Patient denies diarrhea and constipation.  Constitutional:   Patient denies fever, night sweats, weight loss, and fatigue.  Skin:   Patient denies skin rash/ lesion and itching.  Eyes:   Patient denies blurred vision and double vision.  Ears/ Nose/ Throat:   Patient denies sore throat and sinus problems.  Hematologic/Lymphatic:   Patient denies swollen glands and easy bruising.  Cardiovascular:   Patient denies leg swelling and chest pains.  Respiratory:   Patient denies cough and shortness of breath.  Endocrine:   Patient denies excessive thirst.  Musculoskeletal:   Patient denies back pain and joint pain.  Neurological:   Patient denies headaches and dizziness.  Psychologic:   Patient denies depression and anxiety.   VITAL SIGNS:      01/07/2020 01:59 PM  Weight 246 lb / 111.58 kg  Height 74 in / 187.96 cm  BP 157/80 mmHg  Pulse 61 /min  Temperature 97.8 F / 36.5 C  BMI 31.6 kg/m   MULTI-SYSTEM PHYSICAL EXAMINATION:    Constitutional: Well-nourished. No physical deformities. Normally developed. Good grooming.  Neck: Neck symmetrical, not swollen. Normal tracheal position.  Respiratory: Normal breath sounds. No labored breathing, no use of accessory muscles.   Cardiovascular: Regular rate and rhythm. No murmur,  no gallop.   Lymphatic: No enlargement of neck, axillae, groin.  Skin: No paleness, no jaundice, no cyanosis. No lesion, no ulcer, no rash.  Neurologic / Psychiatric: Oriented to time, oriented to place, oriented to person. No depression, no anxiety, no agitation.  Gastrointestinal: No mass, no tenderness, no rigidity, obese abdomen.   Eyes: Normal conjunctivae. Normal eyelids.  Ears, Nose, Mouth, and Throat: Left ear no scars, no lesions, no masses. Right ear no scars, no lesions, no masses. Nose no scars, no lesions, no masses. Normal hearing. Normal lips.  Musculoskeletal: Normal gait and station of head and neck.     Complexity of Data:  Records Review:   Previous Patient Records  Urine Test Review:   Urinalysis   01/07/20  Urinalysis  Urine Appearance Clear   Urine Color Yellow   Urine Glucose Neg mg/dL  Urine Bilirubin Neg mg/dL  Urine Ketones Neg mg/dL  Urine Specific Gravity 1.025   Urine Blood Neg ery/uL  Urine pH 6.0   Urine Protein Neg mg/dL  Urine Urobilinogen 0.2 mg/dL  Urine Nitrites Neg   Urine Leukocyte Esterase Neg leu/uL   PROCEDURES:          Urinalysis - 81003 Dipstick Dipstick Cont'd  Color: Yellow Bilirubin: Neg mg/dL  Appearance: Clear Ketones: Neg mg/dL  Specific Gravity: 1.025 Blood: Neg ery/uL  pH: 6.0 Protein: Neg mg/dL  Glucose: Neg mg/dL Urobilinogen: 0.2 mg/dL    Nitrites: Neg    Leukocyte Esterase: Neg leu/uL    ASSESSMENT:      ICD-10 Details  1 GU:   Prostate Cancer - C61    PLAN:           Schedule Return Visit/Planned Activity: Keep Scheduled Appointment - Schedule Surgery          Document Letter(s):  Created for Patient: Clinical Summary         Notes:   There are no changes in the patients history or physical exam since last evaluation by Dr. Gloriann Loan. Pt is scheduled to undergo brachytherapy and space oar placement on 01/13/20.   All pt's questions were answered to the best of my ability.          Next Appointment:       Next Appointment: 01/13/2020 11:30 AM    Appointment Type: Surgery     Location: [facility] [phone]    Provider: Link Snuffer, III, M.D.    Reason for Visit: OP NE SEED SPACE OAR     Signed by Mcarthur Rossetti, PA on 01/07/20 at 2:19 PM (EDT

## 2020-01-13 NOTE — Interval H&P Note (Signed)
History and Physical Interval Note:  01/13/2020 11:35 AM  Dean Guzman  has presented today for surgery, with the diagnosis of PROSTATE CANCER.  The various methods of treatment have been discussed with the patient and family. After consideration of risks, benefits and other options for treatment, the patient has consented to  Procedure(s) with comments: RADIOACTIVE SEED IMPLANT/BRACHYTHERAPY IMPLANT (N/A) -       SEEDS IMPLANTED SPACE OAR INSTILLATION (N/A) CYSTOSCOPY FLEXIBLE (N/A) - NO SEEDS FOUND IN BLADDER as a surgical intervention.  The patient's history has been reviewed, patient examined, no change in status, stable for surgery.  I have reviewed the patient's chart and labs.  Questions were answered to the patient's satisfaction.     Marton Redwood, III

## 2020-01-13 NOTE — Anesthesia Postprocedure Evaluation (Signed)
Anesthesia Post Note  Patient: Kolbee Bogusz  Procedure(s) Performed: RADIOACTIVE SEED IMPLANT/BRACHYTHERAPY IMPLANT (N/A Prostate) SPACE OAR INSTILLATION (N/A Rectum) CYSTOSCOPY FLEXIBLE (N/A Bladder)     Patient location during evaluation: PACU Anesthesia Type: General Level of consciousness: awake and alert, patient cooperative and oriented Pain management: pain level controlled Vital Signs Assessment: post-procedure vital signs reviewed and stable Respiratory status: spontaneous breathing, nonlabored ventilation and respiratory function stable Cardiovascular status: blood pressure returned to baseline and stable Postop Assessment: no apparent nausea or vomiting Anesthetic complications: no   No complications documented.  Last Vitals:  Vitals:   01/13/20 1315 01/13/20 1330  BP: 133/72 (!) 144/74  Pulse: 60 (!) 59  Resp: 13 16  Temp:    SpO2: 98% 95%    Last Pain:  Vitals:   01/13/20 1330  TempSrc:   PainSc: 0-No pain                 Craig Wisnewski,E. Emet Rafanan

## 2020-01-13 NOTE — Discharge Instructions (Addendum)
Antibiotics You may be given a prescription for an antibiotic to take when you arrive home. If so, be sure to take every tablet in the bottle, even if you are feeling better before the prescription is finished. If you begin itching, notice a rash or start to swell on your trunk, arms, legs and/or throat, immediately stop taking the antibiotic and call your Urologist. Diet Resume your usual diet when you return home. To keep your bowels moving easily and softly, drink prune, apple and cranberry juice at room temperature. You may also take a stool softener, such as Colace, which is available without prescription at local pharmacies. Daily activities No driving or heavy lifting for at least two days after the implant. No bike riding, horseback riding or riding lawn mowers for the first month after the implant. Any strenuous physical activity should be approved by your doctor before you resume it. Sexual relations You may resume sexual relations two weeks after the procedure. A condom should be used for the first two weeks. Your semen may be dark brown or black; this is normal and is related bleeding that may have occurred during the implant. Postoperative swelling Expect swelling and bruising of the scrotum and perineum (the area between the scrotum and anus). Both the swelling and the bruising should resolve in l or 2 weeks. Ice packs and over- the-counter medications such as Tylenol, Advil or Aleve may lessen your discomfort. Postoperative urination Most men experience burning on urination and/or urinary frequency. If this becomes bothersome, contact your Urologist.  Medication can be prescribed to relieve these problems.  It is normal to have some blood in your urine for a few days after the implant. Special instructions related to the seeds It is unlikely that you will pass an Iodine-125 seed in your urine. The seeds are silver in color and are about as large as a grain of rice. If you pass a seed,  do not handle it with your fingers. Use a spoon to place it in an envelope or jar in place this in base occluded area such as the garage or basement for return to the radiation clinic at your convenience.  Contact your doctor for Temperature greater than 101 F Increasing pain Inability to urinate Follow-up  You should have follow up with your urologist and radiation oncologist about 3 weeks after the procedure. General information regarding Iodine seeds Iodine-125 is a low energy radioactive material. It is not deeply penetrating and loses energy at short distances. Your prostate will absorb the radiation. Objects that are touched or used by the patient do not become radioactive. Body wastes (urine and stool) or body fluids (saliva, tears, semen or blood) are not radioactive. The Nuclear Regulatory Commission (NRC) has determined that no radiation precautions are needed for patients undergoing Iodine-125 seed implantation. The NRC states that such patients do not present a risk to the people around them, including young children and pregnant women. However, in keeping with the general principle that radiation exposure should be kept as low reasonably possible, we suggest the following: Children and pets should not sit on the patient's lap for the first two (2) weeks after the implant. Pregnant (or possibly pregnant) women should avoid prolonged, close contact with the patient for the first two (2) weeks after the implant. A distance of three (3) feet is acceptable. At a distance of three (3) feet, there is no limit to the length of time anyone can be with the patient.   Post Anesthesia   length of time anyone can be with the patient. ° ° °Post Anesthesia Home Care Instructions ° °Activity: °Get plenty of rest for the remainder of the day. A responsible adult should stay with you for 24 hours following the procedure.  °For the next 24 hours, DO NOT: °-Drive a car °-Operate machinery °-Drink alcoholic beverages °-Take any medication unless  instructed by your physician °-Make any legal decisions or sign important papers. ° °Meals: °Start with liquid foods such as gelatin or soup. Progress to regular foods as tolerated. Avoid greasy, spicy, heavy foods. If nausea and/or vomiting occur, drink only clear liquids until the nausea and/or vomiting subsides. Call your physician if vomiting continues. ° °Special Instructions/Symptoms: °Your throat may feel dry or sore from the anesthesia or the breathing tube placed in your throat during surgery. If this causes discomfort, gargle with warm salt water. The discomfort should disappear within 24 hours. ° °If you had a scopolamine patch placed behind your ear for the management of post- operative nausea and/or vomiting: ° °1. The medication in the patch is effective for 72 hours, after which it should be removed.  Wrap patch in a tissue and discard in the trash. Wash hands thoroughly with soap and water. °2. You may remove the patch earlier than 72 hours if you experience unpleasant side effects which may include dry mouth, dizziness or visual disturbances. °3. Avoid touching the patch. Wash your hands with soap and water after contact with the patch. °  ° °

## 2020-01-13 NOTE — Op Note (Signed)
PATIENT:  Dean Guzman  PRE-OPERATIVE DIAGNOSIS:  Adenocarcinoma of the prostate  POST-OPERATIVE DIAGNOSIS:  Same  PROCEDURE:  1. I-125 radioactive seed implantation 2. Cystoscopy  3. Placement of SpaceOAR  SURGEON:  Surgeon(s): Wendie Simmer, MD  Radiation oncologist: Dr. Tyler Pita  ANESTHESIA:  General  EBL:  Minimal  DRAINS: 1 French Foley catheter  INDICATION: Dean Guzman  Description of procedure: After informed consent the patient was brought to the major OR, placed on the table and administered general anesthesia. He was then moved to the modified lithotomy position with his perineum perpendicular to the floor. His perineum and genitalia were then sterilely prepped. An official timeout was then performed. A 16 French Foley catheter was then placed in the bladder and filled with dilute contrast, a rectal tube was placed in the rectum and the transrectal ultrasound probe was placed in the rectum and affixed to the stand. He was then sterilely draped.  Real time ultrasonography was used along with the seed planning software Oncentra Prostate. This was used to develop the seed plan including the number of needles as well as number of seeds required for complete and adequate coverage. Real-time ultrasonography was then used along with the previously developed plan and the Nucletron device to implant a total of 73 seeds using 23 needles. This proceeded without difficulty or complication.   I then proceeded with placement of SpaceOARby introducing a needle with the bevel angled inferiorly approximately 2 cm superior to the anus. This was angled downward and under direct ultrasound was placed within the space between the prostatic capsule and rectum. This was confirmed with a small amount of sterile saline injected and this was performed under direct ultrasound. I then attached the SpaceOARto the needle and injected this in the space between the prostate and rectum with  good placement noted.  A Foley catheter was then removed as well as the transrectal ultrasound probe and rectal probe. Flexible cystoscopy was then performed using the 17 French flexible scope which revealed a normal urethra throughout its length down to the sphincter which appeared intact. The prostatic urethra revealed bilobar hypertrophy but no evidence of obstruction, seeds, spacers or lesions. The bladder was then entered and fully and systematically inspected. The ureteral orifices were noted to be of normal configuration and position. The mucosa revealed no evidence of tumors. There were also no stones identified within the bladder. I noted no seeds or spacers on the floor of the bladder and retroflexion of the scope revealed no seeds protruding from the base of the prostate.  The cystoscope was then removed and the patient was awakened and taken to recovery room in stable and satisfactory condition. He tolerated procedure well and there were no intraoperative complications.

## 2020-01-13 NOTE — Transfer of Care (Signed)
Immediate Anesthesia Transfer of Care Note  Patient: Dean Guzman  Procedure(s) Performed: Procedure(s) (LRB): RADIOACTIVE SEED IMPLANT/BRACHYTHERAPY IMPLANT (N/A) SPACE OAR INSTILLATION (N/A) CYSTOSCOPY FLEXIBLE (N/A)  Patient Location: PACU  Anesthesia Type: General  Level of Consciousness: awake, sedated, patient cooperative and responds to stimulation  Airway & Oxygen Therapy: Patient Spontanous Breathing and Patient connected to Onward 02 and soft FM   Post-op Assessment: Report given to PACU RN, Post -op Vital signs reviewed and stable and Patient moving all extremities  Post vital signs: Reviewed and stable  Complications: No apparent anesthesia complications

## 2020-01-15 ENCOUNTER — Encounter (HOSPITAL_BASED_OUTPATIENT_CLINIC_OR_DEPARTMENT_OTHER): Payer: Self-pay | Admitting: Urology

## 2020-01-15 NOTE — Progress Notes (Signed)
  Radiation Oncology         [phone] ________________________________  Name: Dean Guzman MRN: [id number]  Date: 01/15/2020  DOB: 07-Jan-1951       Prostate Seed Implant  [place]:OVZCHYIF, Crosby Oyster, DO  No ref. provider found  DIAGNOSIS:  Oncology History  Malignant neoplasm of prostate ([place])  10/02/2019 Cancer Staging   Staging form: Prostate, AJCC 8th Edition - Clinical stage from 10/02/2019: Stage IIB (cT1c, cN0, cM0, PSA: 6.1, Grade Group: 2) - Signed by Freeman Caldron, PA-C on 10/29/2019   10/29/2019 Initial Diagnosis   Malignant neoplasm of prostate ([place])     No diagnosis found.  PROCEDURE: Insertion of radioactive I-125 seeds into the prostate gland.  RADIATION DOSE: 145 Gy, definitive therapy.  TECHNIQUE: Dean Guzman was brought to the operating room with the urologist. He was placed in the dorsolithotomy position. He was catheterized and a rectal tube was inserted. The perineum was shaved, prepped and draped. The ultrasound probe was then introduced into the rectum to see the prostate gland.  TREATMENT DEVICE: A needle grid was attached to the ultrasound probe stand and anchor needles were placed.  3D PLANNING: The prostate was imaged in 3D using a sagittal sweep of the prostate probe. These images were transferred to the planning computer. There, the prostate, urethra and rectum were defined on each axial reconstructed image. Then, the software created an optimized 3D plan and a few seed positions were adjusted. The quality of the plan was reviewed using [facility] information for the target and the following two organs at risk:  Urethra and Rectum.  Then the accepted plan was printed and handed off to the radiation therapist.  Under my supervision, the custom loading of the seeds and spacers was carried out and loaded into sealed vicryl sleeves.  These pre-loaded needles were then placed into the needle holder.Marland Kitchen  PROSTATE VOLUME STUDY:  Using transrectal ultrasound the volume  of the prostate was verified to be 49.9 cc.  SPECIAL TREATMENT PROCEDURE/SUPERVISION AND HANDLING: The pre-loaded needles were then delivered under sagittal guidance. A total of 23 needles were used to deposit 73 seeds in the prostate gland. The individual seed activity was 0.507 mCi.  SpaceOAR:  Yes  COMPLEX SIMULATION: At the end of the procedure, an anterior radiograph of the pelvis was obtained to document seed positioning and count. Cystoscopy was performed to check the urethra and bladder.  MICRODOSIMETRY: At the end of the procedure, the patient was emitting 0.04 mR/hr at 1 meter. Accordingly, he was considered safe for hospital discharge.  PLAN: The patient will return to the radiation oncology clinic for post implant CT dosimetry in three weeks.   ________________________________  Sheral Apley Tammi Klippel, M.D.

## 2020-02-05 ENCOUNTER — Other Ambulatory Visit: Payer: Self-pay | Admitting: Family Medicine

## 2020-02-05 ENCOUNTER — Telehealth: Payer: Self-pay | Admitting: *Deleted

## 2020-02-05 DIAGNOSIS — I10 Essential (primary) hypertension: Secondary | ICD-10-CM

## 2020-02-05 NOTE — Telephone Encounter (Signed)
CALLED PATIENT TO REMIND OF POST SEED APPTS. FOR 02-06-20, SPOKE WITH PATIENT AND HE IS AWARE OF THESE APPTS.

## 2020-02-05 NOTE — Progress Notes (Signed)
Radiation Oncology         [phone] ________________________________  Name: Dean Guzman MRN: [id number]  Date: 02/06/2020  DOB: August 28, 1950  Post-Seed Follow-Up Visit Note  CC: Shelda Pal, DO  Nani [facility]*  Diagnosis:   69 y.o. gentleman with Stage T1c adenocarcinoma of the prostate with Gleason score of 3+4, and PSA of 6.09.    ICD-10-CM   1. Malignant neoplasm of prostate ([place])  C61     Interval Since Last Radiation:  3 weeks 01/13/20:  Insertion of radioactive I-125 seeds into the prostate gland; 145 Gy, definitive therapy with placement of SpaceOAR VUE gel.  Narrative:  The patient returns today for routine follow-up.  He is complaining of increased urinary frequency and urinary hesitation symptoms. He filled out a questionnaire regarding urinary function today providing and overall IPSS score of 17 characterizing his symptoms as moderate with increased frequency, weak stream and nocturia x4/night.  His pre-implant score was 2.  He specifically denies gross hematuria, dysuria, fever, chills, N/V or suprapubic discomfort.  He is taking tamsulosin daily as prescribed after his procedure.  He denies any abdominal pain or bowel symptoms but has had somewhat of decreased appetite which he relates to a lack of sleep secondary to the nocturia.  He has just recently started taking the Flomax at night as directed by Jiles Crocker, NP. Last night was the first night with the change in timing of medication so, not unexpected, he has not seen any improvement as of yet. Overall, he is pleased with his progress to date.  ALLERGIES:  has No Known Allergies.  Meds: Current Outpatient Medications  Medication Sig Dispense Refill  . amLODipine (NORVASC) 5 MG tablet TAKE 1 TABLET BY MOUTH EVERY DAY 90 tablet 1  . aspirin EC 81 MG tablet Take 81 mg by mouth daily.    . Azelastine-Fluticasone 137-50 MCG/ACT SUSP Place 2 sprays into the nose 2 (two) times daily as needed. 23 g 5   . cholecalciferol (VITAMIN D3) 25 MCG (1000 UT) tablet Take 1,000 Units by mouth daily.    Marland Kitchen DEXILANT 60 MG capsule Take 60 mg by mouth daily.    Marland Kitchen DILT-XR 240 MG 24 hr capsule TAKE 1 CAPSULE BY MOUTH EVERY DAY 90 capsule 1  . hydrochlorothiazide (MICROZIDE) 12.5 MG capsule TAKE 1 CAPSULE BY MOUTH EVERY DAY 90 capsule 0  . HYDROcodone-acetaminophen (NORCO) 5-325 MG tablet Take 1 tablet by mouth every 4 (four) hours as needed for moderate pain. 8 tablet 0  . ipratropium (ATROVENT) 0.06 % nasal spray 2 sprays per nostril 2-3 times daily as needed 15 mL 5  . lisinopril (ZESTRIL) 10 MG tablet TAKE 1 TABLET BY MOUTH EVERY DAY 90 tablet 1  . Omega-3 Fatty Acids (FISH OIL) 1200 MG CAPS Take 1 capsule by mouth 2 (two) times daily.    . simvastatin (ZOCOR) 10 MG tablet TAKE 1 TABLET BY MOUTH EVERYDAY AT BEDTIME 90 tablet 1  . tadalafil (CIALIS) 5 MG tablet Take 5 mg by mouth daily as needed for erectile dysfunction.     No current facility-administered medications for this visit.    Physical Findings: In general this is a well appearing Caucasian male in no acute distress. He's alert and oriented x4 and appropriate throughout the examination. Cardiopulmonary assessment is negative for acute distress and he exhibits normal effort.   Lab Findings: Lab Results  Component Value Date   WBC 5.1 01/09/2020   HGB 14.9 01/09/2020   HCT 45.5  01/09/2020   MCV 90.1 01/09/2020   PLT 173 01/09/2020    Radiographic Findings:  Patient underwent CT imaging in our clinic for post implant dosimetry. The CT will be reviewed by Dr. Tammi Klippel to confirm there is an adequate distribution of radioactive seeds throughout the prostate gland and ensure that there are no seeds in or near the rectum. We suspect the final radiation plan and dosimetry will show appropriate coverage of the prostate gland. He understands that we will call and inform him of any unexpected findings on further review of his imaging and  dosimetry.  Impression/Plan: 69 y.o. gentleman with Stage T1c adenocarcinoma of the prostate with Gleason score of 3+4, and PSA of 6.09. The patient is recovering from the effects of radiation. His urinary symptoms should gradually improve over the next 4-6 months. We talked about this today. He is encouraged by his improvement already and is otherwise pleased with his outcome. We also talked about long-term follow-up for prostate cancer following seed implant. He understands that ongoing PSA determinations and digital rectal exams will help perform surveillance to rule out disease recurrence. He was seen by Jiles Crocker, NP on 02/04/2020 and advised to start taking his Flomax at night instead of in the morning to help better manage his nighttime irritative voiding symptoms.  He is scheduled for labs in December 2021 and will see Dr. Gloriann Loan that following week. He understands what to expect with his PSA measures. Patient was also educated today about some of the long-term effects from radiation including a small risk for rectal bleeding and possibly erectile dysfunction. We talked about some of the general management approaches to these potential complications. However, I did encourage the patient to contact our office or return at any point if he has questions or concerns related to his previous radiation and prostate cancer.    Nicholos Johns, PA-C

## 2020-02-06 ENCOUNTER — Other Ambulatory Visit: Payer: Self-pay | Admitting: Family Medicine

## 2020-02-06 ENCOUNTER — Other Ambulatory Visit: Payer: Self-pay

## 2020-02-06 ENCOUNTER — Encounter: Payer: Self-pay | Admitting: Medical Oncology

## 2020-02-06 ENCOUNTER — Ambulatory Visit
Admission: RE | Admit: 2020-02-06 | Discharge: 2020-02-06 | Disposition: A | Discharge status: Home / Self Care | Payer: Managed Care, Other (non HMO) | Source: Ambulatory Visit | Attending: Urology | Admitting: Urology

## 2020-02-06 ENCOUNTER — Encounter: Payer: Self-pay | Admitting: Urology

## 2020-02-06 ENCOUNTER — Ambulatory Visit
Admission: RE | Admit: 2020-02-06 | Discharge: 2020-02-06 | Disposition: A | Discharge status: Home / Self Care | Payer: Managed Care, Other (non HMO) | Source: Ambulatory Visit | Attending: Radiation Oncology | Admitting: Radiation Oncology

## 2020-02-06 VITALS — BP 140/73 | HR 67 | Temp 98.2°F | Resp 18 | Ht 74.0 in | Wt 250.6 lb

## 2020-02-06 DIAGNOSIS — Z923 Personal history of irradiation: Secondary | ICD-10-CM | POA: Insufficient documentation

## 2020-02-06 DIAGNOSIS — R3911 Hesitancy of micturition: Secondary | ICD-10-CM | POA: Insufficient documentation

## 2020-02-06 DIAGNOSIS — R350 Frequency of micturition: Secondary | ICD-10-CM | POA: Insufficient documentation

## 2020-02-06 DIAGNOSIS — C61 Malignant neoplasm of prostate: Secondary | ICD-10-CM

## 2020-02-06 DIAGNOSIS — R351 Nocturia: Secondary | ICD-10-CM | POA: Insufficient documentation

## 2020-02-06 DIAGNOSIS — Z7982 Long term (current) use of aspirin: Secondary | ICD-10-CM | POA: Insufficient documentation

## 2020-02-06 DIAGNOSIS — I10 Essential (primary) hypertension: Secondary | ICD-10-CM

## 2020-02-06 DIAGNOSIS — Z79899 Other long term (current) drug therapy: Secondary | ICD-10-CM | POA: Insufficient documentation

## 2020-02-06 NOTE — Progress Notes (Signed)
  Radiation Oncology         [phone]) [phone] ________________________________  Name: Dean Guzman MRN: [id number]  Date: 02/06/2020  DOB: June 24, 1950  COMPLEX SIMULATION NOTE  NARRATIVE:  The patient was brought to the [facility] today following prostate seed implantation approximately one month ago.  Identity was confirmed.  All relevant records and images related to the planned course of therapy were reviewed.  Then, the patient was set-up supine.  CT images were obtained.  The CT images were loaded into the planning software.  Then the prostate and rectum were contoured.  Treatment planning then occurred.  The implanted iodine 125 seeds were identified by the physics staff for projection of radiation distribution  I have requested : 3D Simulation  I have requested a DVH of the following structures: Prostate and rectum.    ________________________________  Sheral Apley Tammi Klippel, M.D.

## 2020-02-06 NOTE — Progress Notes (Signed)
Presents to the clinic for post seed follow up. Vitals stable. Denies pain. Pre seed IPSS 2. Post seed IPSS 17. Denies dysuria. Reports hematuria is resolving. Reports seeing only old blood. Denies urinary leakage or incontinence. Reports fatigue and decreased appetite. Evaluated by Jiles Crocker on Tuesday and prescribed FLOMAX. Reports stools are soft. Denies diarrhea. Scheduled to follow up with Dr. Gloriann Loan in 3 months.   BP 140/73   Pulse 67   Temp 98.2 F (36.8 C)   Resp 18   Ht 6\' 2"  (1.88 m)   Wt 250 lb 9.6 oz (113.7 kg)   SpO2 98%   BMI 32.18 kg/m  Wt Readings from Last 3 Encounters:  02/06/20 250 lb 9.6 oz (113.7 kg)  01/13/20 252 lb 9.6 oz (114.6 kg)  10/29/19 245 lb (111.1 kg)

## 2020-03-11 ENCOUNTER — Ambulatory Visit
Admission: RE | Admit: 2020-03-11 | Discharge: 2020-03-11 | Disposition: A | Discharge status: Home / Self Care | Payer: Managed Care, Other (non HMO) | Source: Ambulatory Visit | Attending: Radiation Oncology | Admitting: Radiation Oncology

## 2020-03-11 ENCOUNTER — Encounter: Payer: Self-pay | Admitting: Radiation Oncology

## 2020-03-11 DIAGNOSIS — C61 Malignant neoplasm of prostate: Secondary | ICD-10-CM | POA: Diagnosis not present

## 2020-03-30 ENCOUNTER — Other Ambulatory Visit: Payer: Self-pay | Admitting: Family Medicine

## 2020-03-30 MED ORDER — AMLODIPINE BESYLATE 5 MG PO TABS: 5.0000 mg | ORAL_TABLET | Freq: Every day | ORAL | 0 refills | Status: DC

## 2020-04-05 ENCOUNTER — Other Ambulatory Visit: Payer: Self-pay | Admitting: Family Medicine

## 2020-04-14 ENCOUNTER — Other Ambulatory Visit: Payer: Self-pay | Admitting: Family Medicine

## 2020-04-14 DIAGNOSIS — I10 Essential (primary) hypertension: Secondary | ICD-10-CM

## 2020-04-14 MED ORDER — HYDROCHLOROTHIAZIDE 12.5 MG PO CAPS: ORAL_CAPSULE | ORAL | 0 refills | Status: DC

## 2020-04-17 ENCOUNTER — Other Ambulatory Visit: Payer: Self-pay | Admitting: Family Medicine

## 2020-04-17 DIAGNOSIS — I10 Essential (primary) hypertension: Secondary | ICD-10-CM

## 2020-04-17 MED ORDER — SIMVASTATIN 10 MG PO TABS: 10.0000 mg | ORAL_TABLET | Freq: Every day | ORAL | 1 refills | Status: DC

## 2020-04-17 MED ORDER — DILTIAZEM HCL ER 240 MG PO CP24: ORAL_CAPSULE | ORAL | 1 refills | Status: DC

## 2020-04-17 MED ORDER — LISINOPRIL 10 MG PO TABS: 10.0000 mg | ORAL_TABLET | Freq: Every day | ORAL | 1 refills | Status: DC

## 2020-04-28 ENCOUNTER — Telehealth: Payer: Managed Care, Other (non HMO)

## 2020-04-28 MED ORDER — IPRATROPIUM BROMIDE 0.06 % NA SOLN: NASAL | 5 refills | Status: DC

## 2020-04-28 NOTE — Telephone Encounter (Signed)
Fax from [employer] for a refill Ipratropium 0.06% Nasal Spray. Sent over refill via Epic.

## 2020-05-04 NOTE — Progress Notes (Signed)
  Radiation Oncology         [phone] ________________________________  Name: Dean Guzman MRN: [id number]  Date: 03/11/2020  DOB: 08-26-1950  3D Planning Note   Prostate Brachytherapy Post-Implant Dosimetry  Diagnosis: 69 y.o. gentleman with Stage T1c adenocarcinoma of the prostate with Gleason score of 3+4, and PSA of 6.09.  Narrative: On a previous date, Dean Guzman returned following prostate seed implantation for post implant planning. He underwent CT scan complex simulation to delineate the three-dimensional structures of the pelvis and demonstrate the radiation distribution.  Since that time, the seed localization, and complex isodose planning with dose volume histograms have now been completed.  Results:   Prostate Coverage - The dose of radiation delivered to the 90% or more of the prostate gland (D90) was 108.84% of the prescription dose. This exceeds our goal of greater than 90%. Rectal Sparing - The volume of rectal tissue receiving the prescription dose or higher was 0.0 cc. This falls under our thresholds tolerance of 1.0 cc.  Impression: The prostate seed implant appears to show adequate target coverage and appropriate rectal sparing.  Plan:  The patient will continue to follow with urology for ongoing PSA determinations. I would anticipate a high likelihood for local tumor control with minimal risk for rectal morbidity.  ________________________________  Sheral Apley Tammi Klippel, M.D.

## 2020-06-09 ENCOUNTER — Other Ambulatory Visit: Payer: Self-pay | Admitting: Family Medicine

## 2020-06-09 DIAGNOSIS — I10 Essential (primary) hypertension: Secondary | ICD-10-CM

## 2020-06-11 ENCOUNTER — Other Ambulatory Visit: Payer: Self-pay | Admitting: Family Medicine

## 2020-08-17 ENCOUNTER — Encounter: Payer: Self-pay | Admitting: Family Medicine

## 2020-08-17 ENCOUNTER — Ambulatory Visit (INDEPENDENT_AMBULATORY_CARE_PROVIDER_SITE_OTHER): Payer: Managed Care, Other (non HMO) | Admitting: Family Medicine

## 2020-08-17 ENCOUNTER — Other Ambulatory Visit: Payer: Self-pay

## 2020-08-17 VITALS — BP 122/80 | HR 58 | Temp 97.6°F | Ht 74.0 in | Wt 232.0 lb

## 2020-08-17 DIAGNOSIS — E785 Hyperlipidemia, unspecified: Secondary | ICD-10-CM | POA: Diagnosis not present

## 2020-08-17 DIAGNOSIS — Z0000 Encounter for general adult medical examination without abnormal findings: Secondary | ICD-10-CM | POA: Diagnosis not present

## 2020-08-17 NOTE — Progress Notes (Signed)
Chief Complaint  Patient presents with  . Annual Exam    Well Male Dean Guzman is here for a complete physical.   His last physical was >1 year ago.  Current diet: in general, a "healthy" diet.   Current exercise: walking, active in yard Weight trend: intentionally decreasing Fatigue out of ordinary? No. Seat belt? Yes.    Health maintenance Shingrix- Yes Colonoscopy- Yes Tetanus- Yes Hep C- Yes Pneumonia vaccine- Yes   Past Medical History:  Diagnosis Date  . Barrett's esophagus   . BPH (benign prostatic hyperplasia)   . Colon polyps   . FH: factor V Leiden mutation    niece died of leiden factor five complications, sister has  . Frequent headaches   . GERD (gastroesophageal reflux disease)   . History of kidney stones   . Hyperlipidemia   . Hypertension   . Migraines     no recent migraines had visual aura  . Prostate cancer [facility])      Past Surgical History:  Procedure Laterality Date  . colonscopy    . CYSTOSCOPY N/A 01/13/2020   Procedure: CYSTOSCOPY FLEXIBLE;  Surgeon: Lucas Mallow, MD;  Location: [facility];  Service: Urology;  Laterality: N/A;  NO SEEDS FOUND IN BLADDER  . NASAL ENDOSCOPY    . PROSTATE BIOPSY  may 2021 and 2020  . RADIOACTIVE SEED IMPLANT N/A 01/13/2020   Procedure: RADIOACTIVE SEED IMPLANT/BRACHYTHERAPY IMPLANT;  Surgeon: Lucas Mallow, MD;  Location: [facility];  Service: Urology;  Laterality: N/A;    73 SEEDS IMPLANTED  . SPACE OAR INSTILLATION N/A 01/13/2020   Procedure: SPACE OAR INSTILLATION;  Surgeon: Lucas Mallow, MD;  Location: [facility];  Service: Urology;  Laterality: N/A;    Medications  Current Outpatient Medications on File Prior to Visit  Medication Sig Dispense Refill  . amLODipine (NORVASC) 5 MG tablet Take 1 tablet by mouth daily. 90 tablet 0  . cholecalciferol (VITAMIN D3) 25 MCG (1000 UT) tablet Take 1,000 Units by mouth daily.    Marland Kitchen DEXILANT 60 MG  capsule Take 60 mg by mouth daily.    Marland Kitchen diltiazem (DILT-XR) 240 MG 24 hr capsule TAKE 1 CAPSULE BY MOUTH EVERY DAY 90 capsule 1  . hydrochlorothiazide (MICROZIDE) 12.5 MG capsule Take 1 capsule by mouth daily. 90 capsule 0  . ipratropium (ATROVENT) 0.06 % nasal spray 2 sprays per nostril 2-3 times daily as needed 15 mL 5  . lisinopril (ZESTRIL) 10 MG tablet Take 1 tablet (10 mg total) by mouth daily. 90 tablet 1  . Omega-3 Fatty Acids (FISH OIL) 1200 MG CAPS Take 1 capsule by mouth 2 (two) times daily.    . simvastatin (ZOCOR) 10 MG tablet Take 1 tablet (10 mg total) by mouth daily at 6 PM. 90 tablet 1  . tadalafil (CIALIS) 5 MG tablet Take 5 mg by mouth daily as needed for erectile dysfunction.     Allergies No Known Allergies  Family History Family History  Problem Relation Age of Onset  . Arthritis Mother   . Heart disease Father   . Hyperlipidemia Father   . Hypertension Father   . Breast cancer Sister   . Diabetes Maternal Grandmother   . Hearing loss Paternal Grandmother   . Early death Paternal Grandmother   . Hearing loss Paternal Grandfather   . Early death Paternal Grandfather   . Allergic rhinitis Neg Hx   . Asthma Neg Hx   .  Eczema Neg Hx   . Urticaria Neg Hx   . Prostate cancer Neg Hx   . Colon cancer Neg Hx   . Pancreatic cancer Neg Hx     Review of Systems: Constitutional:  no fevers Eye:  no recent significant change in vision Ears:  No changes in hearing Nose/Mouth/Throat:  no complaints of nasal congestion, no sore throat Cardiovascular: no chest pain Respiratory:  No shortness of breath Gastrointestinal:  No change in bowel habits GU:  +chronic frequency Integumentary:  no abnormal skin lesions reported Neurologic:  no headaches Endocrine:  denies unexplained weight changes  Exam BP 122/80 (BP Location: Left Arm, Patient Position: Sitting, Cuff Size: Normal)   Pulse (!) 58   Temp 97.6 F (36.4 C) (Oral)   Ht 6\' 2"  (1.88 m)   Wt 232 lb (105.2 kg)    SpO2 97%   BMI 29.79 kg/m  General:  well developed, well nourished, in no apparent distress Skin:  no significant moles, warts, or growths Head:  no masses, lesions, or tenderness Eyes:  pupils equal and round, sclera anicteric without injection Ears:  canals without lesions, TMs shiny without retraction, no obvious effusion, no erythema Nose:  nares patent, septum midline, mucosa normal Throat/Pharynx:  lips and gingiva without lesion; tongue and uvula midline; non-inflamed pharynx; no exudates or postnasal drainage Lungs:  clear to auscultation, breath sounds equal bilaterally, no respiratory distress Cardio: bradycardic, regular rhythm, no LE edema or bruits Rectal: Deferred GI: BS+, S, NT, ND, no masses or organomegaly Musculoskeletal:  symmetrical muscle groups noted without atrophy or deformity Neuro:  gait normal; deep tendon reflexes normal and symmetric Psych: well oriented with normal range of affect and appropriate judgment/insight  Assessment and Plan  Well adult exam  Hyperlipidemia, unspecified hyperlipidemia type - Plan: Comprehensive metabolic panel, Lipid panel   Well 70 y.o. male. Counseled on diet and exercise. Other orders as above. Follow up in 6 mo or prn.  The patient voiced understanding and agreement to the plan.  [place], DO 08/17/20 2:44 PM

## 2020-08-17 NOTE — Patient Instructions (Addendum)
Give [place] 2-3 business days to get the results of your labs back.   Keep the diet clean and stay active.  Great work with your weight loss.  Try to drink 50-55 oz of water daily outside of exercise.  Consider CoQ10 to help with the thigh pain.   Let us know if you need anything.

## 2020-08-18 ENCOUNTER — Other Ambulatory Visit: Payer: Self-pay | Admitting: Family Medicine

## 2020-08-18 LAB — LIPID PANEL
Cholesterol: 174 mg/dL (ref 0–200)
HDL: 44.8 mg/dL (ref >39.00)
LDL Cholesterol: 107 mg/dL — ABNORMAL HIGH (ref 0–99)
NonHDL: 128.92
Total CHOL/HDL Ratio: 4
Triglycerides: 108 mg/dL (ref 0.0–149.0)
VLDL: 21.6 mg/dL (ref 0.0–40.0)

## 2020-08-18 LAB — COMPREHENSIVE METABOLIC PANEL
ALT: 18 U/L (ref 0–53)
AST: 16 U/L (ref 0–37)
Albumin: 4.7 g/dL (ref 3.5–5.2)
Alkaline Phosphatase: 61 U/L (ref 39–117)
BUN: 17 mg/dL (ref 6–23)
CO2: 32 mEq/L (ref 19–32)
Calcium: 10 mg/dL (ref 8.4–10.5)
Chloride: 100 mEq/L (ref 96–112)
Creatinine, Ser: 1.1 mg/dL (ref 0.40–1.50)
GFR: 68.29 mL/min (ref >60.00)
Glucose, Bld: 96 mg/dL (ref 70–99)
Potassium: 4.1 mEq/L (ref 3.5–5.1)
Sodium: 140 mEq/L (ref 135–145)
Total Bilirubin: 0.7 mg/dL (ref 0.2–1.2)
Total Protein: 6.9 g/dL (ref 6.0–8.3)

## 2020-10-03 ENCOUNTER — Other Ambulatory Visit: Payer: Self-pay | Admitting: Family Medicine

## 2020-10-03 DIAGNOSIS — I10 Essential (primary) hypertension: Secondary | ICD-10-CM

## 2020-10-21 ENCOUNTER — Ambulatory Visit: Payer: Managed Care, Other (non HMO) | Admitting: Family Medicine

## 2020-10-21 ENCOUNTER — Encounter: Payer: Self-pay | Admitting: Family Medicine

## 2020-10-21 ENCOUNTER — Other Ambulatory Visit: Payer: Self-pay

## 2020-10-21 VITALS — BP 128/80 | HR 54 | Temp 98.0°F | Ht 74.0 in | Wt 233.0 lb

## 2020-10-21 DIAGNOSIS — Q828 Other specified congenital malformations of skin: Secondary | ICD-10-CM

## 2020-10-21 DIAGNOSIS — M7130 Other bursal cyst, unspecified site: Secondary | ICD-10-CM

## 2020-10-21 NOTE — Patient Instructions (Addendum)
If you do not hear anything about your referral in the next 1-2 weeks, call our office and ask for an update.  Soak your feet/bathe twice daily. Take a pumice stone or foot file and pare it down as much as possible. Consider donut pads and/or metatarsal pads to help with support. Send me a message in 3-4 weeks if not improving or if you are not satisfied with results and we will set you up with one of our foot specialists.   Let us know if you need anything.

## 2020-10-21 NOTE — Progress Notes (Signed)
Chief Complaint  Patient presents with  . bump on bottom of foot/cyst on finger    Dean Guzman is a 70 y.o. male here for a skin complaint.  Duration: 2 months Location: L middle finger Pruritic? No Painful? No Drainage? No Other associated symptoms: Digging into nail causing a groove Therapies tried thus far: none  3 mo of a lesion on his R foot.  It is painful when he bears weight.  He has not tried anything at home.  No neurologic signs or symptoms.  No redness, drainage, itching.  Past Medical History:  Diagnosis Date  . Barrett's esophagus   . BPH (benign prostatic hyperplasia)   . Colon polyps   . FH: factor V Leiden mutation    niece died of leiden factor five complications, sister has  . Frequent headaches   . GERD (gastroesophageal reflux disease)   . History of kidney stones   . Hyperlipidemia   . Hypertension   . Migraines     no recent migraines had visual aura  . Prostate cancer ([facility])     BP 128/80 (BP Location: Left Arm, Patient Position: Sitting, Cuff Size: Normal)   Pulse (!) 54   Temp 98 F (36.7 C) (Oral)   Ht 6\' 2"  (1.88 m)   Wt 233 lb (105.7 kg)   SpO2 98%   BMI 29.92 kg/m  Gen: awake, alert, appearing stated age Lungs: No accessory muscle use Skin: Left middle finger, vesicle w clear fluid.  There is also a groove longitudinally on the lateral portion of the nail.  No drainage, erythema, TTP, fluctuance, excoriation On the lateral plantar surface of the right foot, there is a thickened patch of skin with a central dimple.  Is not tender to palpation, fluctuant, erythematous, excoriated, has no drainage. Psych: Age appropriate judgment and insight  Myxoid cyst - Plan: Ambulatory referral to Hand Surgery  Porokeratosis  1. Consider compression. Refer hand.  2. Chronic, uncontrolled. Pare down after soaks, donut pads, consider metatarsal pads. If no improvement, will refer to podiatry.   F/u prn. The patient voiced understanding and  agreement to the plan.  [place], DO 10/21/20 10:11 AM

## 2020-10-27 ENCOUNTER — Other Ambulatory Visit: Payer: Self-pay | Admitting: Family Medicine

## 2020-10-29 ENCOUNTER — Other Ambulatory Visit: Payer: Self-pay | Admitting: Orthopedic Surgery

## 2020-11-19 ENCOUNTER — Other Ambulatory Visit: Payer: Self-pay | Admitting: Family Medicine

## 2020-12-03 ENCOUNTER — Other Ambulatory Visit: Payer: Self-pay | Admitting: Family Medicine

## 2020-12-03 DIAGNOSIS — I10 Essential (primary) hypertension: Secondary | ICD-10-CM

## 2020-12-07 ENCOUNTER — Other Ambulatory Visit: Payer: Self-pay

## 2020-12-07 ENCOUNTER — Encounter (HOSPITAL_BASED_OUTPATIENT_CLINIC_OR_DEPARTMENT_OTHER): Payer: Self-pay | Admitting: Orthopedic Surgery

## 2020-12-14 ENCOUNTER — Ambulatory Visit (HOSPITAL_BASED_OUTPATIENT_CLINIC_OR_DEPARTMENT_OTHER)
Admission: RE | Admit: 2020-12-14 | Payer: Managed Care, Other (non HMO) | Source: Home / Self Care | Admitting: Orthopedic Surgery

## 2020-12-14 SURGERY — EXCISION MASS
Anesthesia: Choice | Laterality: Left

## 2021-01-26 ENCOUNTER — Other Ambulatory Visit: Payer: Self-pay | Admitting: Orthopedic Surgery

## 2021-02-02 ENCOUNTER — Encounter (HOSPITAL_BASED_OUTPATIENT_CLINIC_OR_DEPARTMENT_OTHER): Payer: Self-pay | Admitting: Orthopedic Surgery

## 2021-02-02 ENCOUNTER — Other Ambulatory Visit: Payer: Self-pay

## 2021-02-04 ENCOUNTER — Encounter (HOSPITAL_BASED_OUTPATIENT_CLINIC_OR_DEPARTMENT_OTHER)
Admission: RE | Admit: 2021-02-04 | Discharge: 2021-02-04 | Disposition: A | Discharge status: Home / Self Care | Payer: Managed Care, Other (non HMO) | Source: Ambulatory Visit | Attending: Orthopedic Surgery | Admitting: Orthopedic Surgery

## 2021-02-04 DIAGNOSIS — Z01818 Encounter for other preprocedural examination: Secondary | ICD-10-CM | POA: Diagnosis not present

## 2021-02-04 LAB — BASIC METABOLIC PANEL
Anion gap: 6 (ref 5–15)
BUN: 20 mg/dL (ref 8–23)
CO2: 30 mmol/L (ref 22–32)
Calcium: 9.6 mg/dL (ref 8.9–10.3)
Chloride: 101 mmol/L (ref 98–111)
Creatinine, Ser: 1.22 mg/dL (ref 0.61–1.24)
GFR, Estimated: >60 mL/min (ref >60)
Glucose, Bld: 105 mg/dL — ABNORMAL HIGH (ref 70–99)
Potassium: 5.3 mmol/L — ABNORMAL HIGH (ref 3.5–5.1)
Sodium: 137 mmol/L (ref 135–145)

## 2021-02-04 NOTE — Progress Notes (Signed)

## 2021-02-05 NOTE — Progress Notes (Signed)
K+ 5.3, Dr. Lanetta Inch aware, will proceed with surgery as scheduled.

## 2021-02-08 ENCOUNTER — Ambulatory Visit (HOSPITAL_BASED_OUTPATIENT_CLINIC_OR_DEPARTMENT_OTHER): Payer: Managed Care, Other (non HMO) | Admitting: Anesthesiology

## 2021-02-08 ENCOUNTER — Ambulatory Visit (HOSPITAL_BASED_OUTPATIENT_CLINIC_OR_DEPARTMENT_OTHER)
Admission: RE | Admit: 2021-02-08 | Discharge: 2021-02-08 | Disposition: A | Discharge status: Home / Self Care | Payer: Managed Care, Other (non HMO) | Attending: Orthopedic Surgery | Admitting: Orthopedic Surgery

## 2021-02-08 ENCOUNTER — Other Ambulatory Visit: Payer: Self-pay

## 2021-02-08 ENCOUNTER — Encounter (HOSPITAL_BASED_OUTPATIENT_CLINIC_OR_DEPARTMENT_OTHER)
Admission: RE | Disposition: A | Discharge status: Home / Self Care | Payer: Self-pay | Source: Home / Self Care | Attending: Orthopedic Surgery

## 2021-02-08 ENCOUNTER — Encounter (HOSPITAL_BASED_OUTPATIENT_CLINIC_OR_DEPARTMENT_OTHER): Payer: Self-pay | Admitting: Orthopedic Surgery

## 2021-02-08 DIAGNOSIS — Z8249 Family history of ischemic heart disease and other diseases of the circulatory system: Secondary | ICD-10-CM | POA: Diagnosis not present

## 2021-02-08 DIAGNOSIS — I10 Essential (primary) hypertension: Secondary | ICD-10-CM | POA: Diagnosis not present

## 2021-02-08 DIAGNOSIS — M67442 Ganglion, left hand: Secondary | ICD-10-CM | POA: Insufficient documentation

## 2021-02-08 DIAGNOSIS — N400 Enlarged prostate without lower urinary tract symptoms: Secondary | ICD-10-CM | POA: Insufficient documentation

## 2021-02-08 DIAGNOSIS — Z8261 Family history of arthritis: Secondary | ICD-10-CM | POA: Insufficient documentation

## 2021-02-08 DIAGNOSIS — Z8719 Personal history of other diseases of the digestive system: Secondary | ICD-10-CM | POA: Diagnosis not present

## 2021-02-08 DIAGNOSIS — M19042 Primary osteoarthritis, left hand: Secondary | ICD-10-CM | POA: Diagnosis not present

## 2021-02-08 DIAGNOSIS — Z87891 Personal history of nicotine dependence: Secondary | ICD-10-CM | POA: Diagnosis not present

## 2021-02-08 DIAGNOSIS — E785 Hyperlipidemia, unspecified: Secondary | ICD-10-CM | POA: Insufficient documentation

## 2021-02-08 HISTORY — DX: Other complications of anesthesia, initial encounter: T88.59XA

## 2021-02-08 HISTORY — PX: MASS EXCISION: SHX2000

## 2021-02-08 SURGERY — EXCISION MASS
Anesthesia: Regional | Site: Finger | Laterality: Left

## 2021-02-08 MED ORDER — CEFAZOLIN SODIUM-DEXTROSE 2-4 GM/100ML-% IV SOLN
2.0000 g | INTRAVENOUS | Status: AC
  Administered 2021-02-08: 2 g via INTRAVENOUS

## 2021-02-08 MED ORDER — FENTANYL CITRATE (PF) 100 MCG/2ML IJ SOLN: 25.0000 ug | INTRAMUSCULAR | Status: DC | PRN

## 2021-02-08 MED ORDER — FENTANYL CITRATE (PF) 100 MCG/2ML IJ SOLN
INTRAMUSCULAR | Status: DC | PRN
  Administered 2021-02-08: 50 ug via INTRAVENOUS
  Administered 2021-02-08: 25 ug via INTRAVENOUS

## 2021-02-08 MED ORDER — FENTANYL CITRATE (PF) 100 MCG/2ML IJ SOLN
INTRAMUSCULAR | Status: AC
  Filled 2021-02-08: qty 2

## 2021-02-08 MED ORDER — BUPIVACAINE HCL (PF) 0.25 % IJ SOLN
INTRAMUSCULAR | Status: DC | PRN
  Administered 2021-02-08: 10 mL

## 2021-02-08 MED ORDER — ONDANSETRON HCL 4 MG/2ML IJ SOLN
INTRAMUSCULAR | Status: DC | PRN
  Administered 2021-02-08: 4 mg via INTRAVENOUS

## 2021-02-08 MED ORDER — ONDANSETRON HCL 4 MG/2ML IJ SOLN
INTRAMUSCULAR | Status: AC
  Filled 2021-02-08: qty 2

## 2021-02-08 MED ORDER — LIDOCAINE HCL (PF) 0.5 % IJ SOLN
INTRAMUSCULAR | Status: DC | PRN
  Administered 2021-02-08: 35 mL via INTRAVENOUS

## 2021-02-08 MED ORDER — CEFAZOLIN SODIUM-DEXTROSE 2-4 GM/100ML-% IV SOLN
INTRAVENOUS | Status: AC
  Filled 2021-02-08: qty 100

## 2021-02-08 MED ORDER — PROPOFOL 500 MG/50ML IV EMUL
INTRAVENOUS | Status: DC | PRN
  Administered 2021-02-08: 75 ug/kg/min via INTRAVENOUS

## 2021-02-08 MED ORDER — CEFAZOLIN SODIUM-DEXTROSE 2-4 GM/100ML-% IV SOLN: 2.0000 g | INTRAVENOUS | Status: DC

## 2021-02-08 MED ORDER — LACTATED RINGERS IV SOLN: INTRAVENOUS | Status: DC

## 2021-02-08 MED ORDER — HYDROCODONE-ACETAMINOPHEN 5-325 MG PO TABS: ORAL_TABLET | ORAL | 0 refills | Status: DC

## 2021-02-08 SURGICAL SUPPLY — 52 items
APL PRP STRL LF DISP 70% ISPRP (MISCELLANEOUS) ×1
APL SKNCLS STERI-STRIP NONHPOA (GAUZE/BANDAGES/DRESSINGS)
BENZOIN TINCTURE PRP APPL 2/3 (GAUZE/BANDAGES/DRESSINGS) IMPLANT
BLADE MINI RND TIP GREEN BEAV (BLADE) IMPLANT
BLADE SURG 15 STRL LF DISP TIS (BLADE) ×2 IMPLANT
BLADE SURG 15 STRL SS (BLADE) ×4
BNDG CMPR 9X4 STRL LF SNTH (GAUZE/BANDAGES/DRESSINGS)
BNDG COHESIVE 1X5 TAN STRL LF (GAUZE/BANDAGES/DRESSINGS) IMPLANT
BNDG COHESIVE 2X5 TAN ST LF (GAUZE/BANDAGES/DRESSINGS) IMPLANT
BNDG CONFORM 2 STRL LF (GAUZE/BANDAGES/DRESSINGS) IMPLANT
BNDG ELASTIC 2X5.8 VLCR STR LF (GAUZE/BANDAGES/DRESSINGS) IMPLANT
BNDG ELASTIC 3X5.8 VLCR STR LF (GAUZE/BANDAGES/DRESSINGS) IMPLANT
BNDG ESMARK 4X9 LF (GAUZE/BANDAGES/DRESSINGS) IMPLANT
BNDG GAUZE 1X2.1 STRL (MISCELLANEOUS) IMPLANT
BNDG GAUZE ELAST 4 BULKY (GAUZE/BANDAGES/DRESSINGS) IMPLANT
BNDG PLASTER X FAST 3X3 WHT LF (CAST SUPPLIES) IMPLANT
BNDG PLSTR 9X3 FST ST WHT (CAST SUPPLIES)
CHLORAPREP W/TINT 26 (MISCELLANEOUS) ×2 IMPLANT
CORD BIPOLAR FORCEPS 12FT (ELECTRODE) ×2 IMPLANT
COVER BACK TABLE 60X90IN (DRAPES) ×2 IMPLANT
COVER MAYO STAND STRL (DRAPES) ×2 IMPLANT
CUFF TOURN SGL QUICK 18X4 (TOURNIQUET CUFF) ×2 IMPLANT
DRAPE EXTREMITY T 121X128X90 (DISPOSABLE) ×2 IMPLANT
DRAPE SURG 17X23 STRL (DRAPES) ×2 IMPLANT
GAUZE SPONGE 4X4 12PLY STRL (GAUZE/BANDAGES/DRESSINGS) ×2 IMPLANT
GAUZE XEROFORM 1X8 LF (GAUZE/BANDAGES/DRESSINGS) ×2 IMPLANT
GLOVE SRG 8 PF TXTR STRL LF DI (GLOVE) ×1 IMPLANT
GLOVE SURG ENC MOIS LTX SZ7.5 (GLOVE) ×2 IMPLANT
GLOVE SURG UNDER POLY LF SZ8 (GLOVE) ×2
GOWN STRL REUS W/ TWL LRG LVL3 (GOWN DISPOSABLE) ×1 IMPLANT
GOWN STRL REUS W/TWL LRG LVL3 (GOWN DISPOSABLE) ×2
GOWN STRL REUS W/TWL XL LVL3 (GOWN DISPOSABLE) ×2 IMPLANT
NEEDLE HYPO 25X1 1.5 SAFETY (NEEDLE) ×2 IMPLANT
NS IRRIG 1000ML POUR BTL (IV SOLUTION) ×2 IMPLANT
PACK BASIN DAY SURGERY FS (CUSTOM PROCEDURE TRAY) ×2 IMPLANT
PAD CAST 3X4 CTTN HI CHSV (CAST SUPPLIES) IMPLANT
PAD CAST 4YDX4 CTTN HI CHSV (CAST SUPPLIES) IMPLANT
PADDING CAST ABS 4INX4YD NS (CAST SUPPLIES) ×1
PADDING CAST ABS COTTON 4X4 ST (CAST SUPPLIES) ×1 IMPLANT
PADDING CAST COTTON 3X4 STRL (CAST SUPPLIES)
PADDING CAST COTTON 4X4 STRL (CAST SUPPLIES)
STOCKINETTE 4X48 STRL (DRAPES) ×2 IMPLANT
STRIP CLOSURE SKIN 1/2X4 (GAUZE/BANDAGES/DRESSINGS) IMPLANT
SUT ETHILON 3 0 PS 1 (SUTURE) IMPLANT
SUT ETHILON 4 0 PS 2 18 (SUTURE) ×2 IMPLANT
SUT ETHILON 5 0 P 3 18 (SUTURE)
SUT NYLON ETHILON 5-0 P-3 1X18 (SUTURE) IMPLANT
SUT VIC AB 4-0 P2 18 (SUTURE) IMPLANT
SYR BULB EAR ULCER 3OZ GRN STR (SYRINGE) ×2 IMPLANT
SYR CONTROL 10ML LL (SYRINGE) ×2 IMPLANT
TOWEL GREEN STERILE FF (TOWEL DISPOSABLE) ×4 IMPLANT
UNDERPAD 30X36 HEAVY ABSORB (UNDERPADS AND DIAPERS) ×2 IMPLANT

## 2021-02-08 NOTE — Transfer of Care (Signed)
Immediate Anesthesia Transfer of Care Note  Patient: Dean Guzman  Procedure(s) Performed: EXCISION MASS OF MUCOID CYST WITH DISTAL INTERPHANGEAL JOINT ARTHROTOMY LEFT MIDDLE FINGER (Left: Finger)  Patient Location: PACU  Anesthesia Type:MAC and Bier block  Level of Consciousness: awake, alert  and oriented  Airway & Oxygen Therapy: Patient Spontanous Breathing and Patient connected to face mask oxygen  Post-op Assessment: Report given to RN and Post -op Vital signs reviewed and stable  Post vital signs: Reviewed and stable  Last Vitals:  Vitals Value Taken Time  BP 154/96 02/08/21 1537  Temp    Pulse 47 02/08/21 1539  Resp 12 02/08/21 1539  SpO2 99 % 02/08/21 1539  Vitals shown include unvalidated device data.  Last Pain:  Vitals:   02/08/21 1311  TempSrc: Oral  PainSc: 0-No pain      Patients Stated Pain Goal: 3 (93/81/01 7510)  Complications: No notable events documented.

## 2021-02-08 NOTE — H&P (Signed)
Dean Guzman is an 70 y.o. male.   Chief Complaint: mucoid cyst HPI: 70 yo male with left long finger cyst.  It is bothersome to him and he wishes to have it removed.  Allergies: No Known Allergies  Past Medical History:  Diagnosis Date   Barrett's esophagus    BPH (benign prostatic hyperplasia)    Colon polyps    Complication of anesthesia    during biopsy, felt hot and sweating   FH: factor V Leiden mutation    niece died of leiden factor five complications, sister has   Frequent headaches    GERD (gastroesophageal reflux disease)    History of kidney stones    Hyperlipidemia    Hypertension    Migraines     no recent migraines had visual aura   Prostate cancer [facility])     Past Surgical History:  Procedure Laterality Date   colonscopy     CYSTOSCOPY N/A 01/13/2020   Procedure: CYSTOSCOPY FLEXIBLE;  Surgeon: Lucas Mallow, MD;  Location: [facility];  Service: Urology;  Laterality: N/A;  NO SEEDS FOUND IN BLADDER   NASAL ENDOSCOPY     PROSTATE BIOPSY  may 2021 and 2020   RADIOACTIVE SEED IMPLANT N/A 01/13/2020   Procedure: RADIOACTIVE SEED IMPLANT/BRACHYTHERAPY IMPLANT;  Surgeon: Lucas Mallow, MD;  Location: [facility];  Service: Urology;  Laterality: N/A;    73 SEEDS IMPLANTED   SPACE OAR INSTILLATION N/A 01/13/2020   Procedure: SPACE OAR INSTILLATION;  Surgeon: Lucas Mallow, MD;  Location: [facility];  Service: Urology;  Laterality: N/A;    Family History: Family History  Problem Relation Age of Onset   Arthritis Mother    Heart disease Father    Hyperlipidemia Father    Hypertension Father    Breast cancer Sister    Diabetes Maternal Grandmother    Hearing loss Paternal Grandmother    Early death Paternal Grandmother    Hearing loss Paternal Grandfather    Early death Paternal Grandfather    Allergic rhinitis Neg Hx    Asthma Neg Hx    Eczema Neg Hx    Urticaria Neg Hx    Prostate cancer Neg Hx     Colon cancer Neg Hx    Pancreatic cancer Neg Hx     Social History:   reports that he quit smoking about 42 years ago. His smoking use included cigarettes. He has a 12.00 pack-year smoking history. He has never used smokeless tobacco. He reports current alcohol use. He reports that he does not currently use drugs.  Medications: No medications prior to admission.    No results found for this or any previous visit (from the past 48 hour(s)).  No results found.     Height '6\' 2"'$  (1.88 m), weight 102.1 kg.  General appearance: alert, cooperative, and appears stated age Head: Normocephalic, without obvious abnormality, atraumatic Neck: supple, symmetrical, trachea midline Cardio: regular rate and rhythm Resp: clear to auscultation bilaterally Extremities: Intact sensation and capillary refill all digits.  +epl/fpl/io.  No wounds.  Pulses: 2+ and symmetric Skin: Skin color, texture, turgor normal. 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  Non operative and operative treatment options have been discussed with the patient and patient wishes to proceed with operative treatment. Risks, benefits, and alternatives of surgery have been discussed and the patient agrees with the plan of  care.   [name] 02/08/2021, 12:28 PM

## 2021-02-08 NOTE — Anesthesia Postprocedure Evaluation (Signed)
Anesthesia Post Note  Patient: Dean Guzman  Procedure(s) Performed: EXCISION MASS OF MUCOID CYST WITH DISTAL INTERPHANGEAL JOINT ARTHROTOMY LEFT MIDDLE FINGER (Left: Finger)     Patient location during evaluation: PACU Anesthesia Type: Bier Block Level of consciousness: awake and alert Pain management: pain level controlled Vital Signs Assessment: post-procedure vital signs reviewed and stable Respiratory status: spontaneous breathing Cardiovascular status: stable Anesthetic complications: no   No notable events documented.  Last Vitals:  Vitals:   02/08/21 1548 02/08/21 1600  BP: (!) 139/59 117/60  Pulse: (!) 58 (!) 49  Resp: 19 16  Temp:  36.6 C  SpO2: 94% 95%    Last Pain:  Vitals:   02/08/21 1600  TempSrc:   PainSc: 0-No pain                 Nolon Nations

## 2021-02-08 NOTE — Op Note (Signed)
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  Left long finger excision mucoid cyst 2.  Left long finger arthrotomy and debridement of DIP joint   SURGEON:  [name], M.D.   ASSISTANT: none   ANESTHESIA:  Bier block with sedation   INTRAVENOUS FLUIDS:  Per anesthesia flow sheet.   ESTIMATED BLOOD LOSS:  Minimal.   COMPLICATIONS:  None.   SPECIMENS: Left long finger mucoid cyst to pathology   TOURNIQUET TIME:    Total Tourniquet Time Documented: Forearm (Left) - 29 minutes Total: Forearm (Left) - 29 minutes    DISPOSITION:  Stable to PACU.   INDICATIONS: 70 year old male with mucoid cyst of the left long finger.  This is caused a nail groove.  It is bothersome to him.  He wishes to have it removed.  The cyst has decreased in size.  He wishes to proceed with the procedure to try to prevent recurrence.  Risks, benefits and alternatives of surgery were discussed including the risks of blood loss, infection, damage to nerves, vessels, tendons, ligaments, bone for surgery, need for additional surgery, complications with wound healing, continued pain, stiffness recurrence.  He voiced understanding of these risks and elected to proceed.  OPERATIVE COURSE:  After being identified preoperatively by myself,  the patient and I agreed on the procedure and site of the procedure.  The surgical site was marked.  Surgical consent had been signed. He was given IV antibiotics as preoperative antibiotic prophylaxis. He was transferred to the operating room and placed on the operating table in supine position with the Left upper extremity on an arm board.   Bier block anesthesia was induced by the anesthesiologist.  Left upper extremity was prepped and draped in normal sterile orthopedic fashion.  A surgical pause was performed between the surgeons, anesthesia, and operating room staff and all were in agreement as to the patient, procedure, and site of procedure.  Tourniquet at the proximal aspect of the forearm had been inflated for the Bier block.  This was augmented with a digital block using 10 cc of quarter percent plain Marcaine.  A hockey-stick shaped incision was made at the DIP joint of the left long finger.  This was carried in subcutaneous tissues by spreading technique.  Bipolar electrocautery is used to obtain hemostasis.  The cyst was identified and removed with the synovectomy rongeurs.  Was sent to pathology for examination.  The DIP joint was entered underneath the extensor tendon.  It was debrided using the synovectomy rongeurs.  There is prominence of bone at the radial aspect of the joint.  This was taken down with the rongeurs.  This was coming from the base of the distal phalanx.  The wound and joint were copiously irrigated with sterile saline.  The wound was closed with 4-0 nylon in a horizontal mattress fashion.  It was dressed with sterile Xeroform 4 x 4 and wrapped with a Coban dressing lightly.  An AlumaFoam splint was placed and wrapped lightly with Coban dressing.  The tourniquet was deflated at 29 minutes.  Fingertips were pink with brisk capillary refill after deflation of tourniquet.  The operative  drapes were broken down.  The patient was awoken from anesthesia safely.  He was transferred back to the stretcher and taken to PACU in stable condition.  I will see him back in the office in 1 week for postoperative followup.  I will give him a prescription for Norco 5/325 1-2 tabs PO q6 hours prn pain, dispense # 15.   [name], MD Electronically signed, 02/08/21

## 2021-02-08 NOTE — Anesthesia Procedure Notes (Signed)
Anesthesia Regional Block: Bier block (IV Regional)   Pre-Anesthetic Checklist: , timeout performed,  Correct Patient, Correct Site, Correct Laterality,  Correct Procedure, Correct Position, site marked,  Risks and benefits discussed,  Surgical consent,  Pre-op evaluation,  At surgeon's request and post-op pain management  Laterality: Left  Prep: alcohol swabs        Procedures:,,,,, intact distal pulses, Esmarch exsanguination,  Single tourniquet utilized,  #20gu IV placed    Narrative:   Performed by: Personally  CRNA: Verita Lamb, CRNA

## 2021-02-08 NOTE — Anesthesia Preprocedure Evaluation (Addendum)
Anesthesia Evaluation  Patient identified by MRN, date of birth, ID band Patient awake    Reviewed: Allergy & Precautions, NPO status , Patient's Chart, lab work & pertinent test results  History of Anesthesia Complications Negative for: history of anesthetic complications  Airway Mallampati: II  TM Distance: >3 FB Neck ROM: Full    Dental  (+) Dental Advisory Given, Teeth Intact,    Pulmonary COPD,  COPD inhaler, former smoker,  01/09/2020 SARS coronavirus NEG   Pulmonary exam normal breath sounds clear to auscultation       Cardiovascular hypertension, Pt. on medications (-) anginaNormal cardiovascular exam Rhythm:Regular Rate:Normal     Neuro/Psych  Headaches,    GI/Hepatic Neg liver ROS, GERD  Medicated and Controlled,  Endo/Other  obese  Renal/GU Renal InsufficiencyRenal disease (creat 1.26)   Prostate cancer    Musculoskeletal   Abdominal (+) - obese,   Peds  Hematology negative hematology ROS (+)   Anesthesia Other Findings   Reproductive/Obstetrics                            Anesthesia Physical  Anesthesia Plan  ASA: 3  Anesthesia Plan: Bier Block and Bier Block-LIDOCAINE ONLY   Post-op Pain Management:    Induction: Intravenous  PONV Risk Score and Plan: 1 and Ondansetron, Propofol infusion, Treatment may vary due to age or medical condition and Midazolam  Airway Management Planned:   Additional Equipment: None  Intra-op Plan:   Post-operative Plan:   Informed Consent: I have reviewed the patients History and Physical, chart, labs and discussed the procedure including the risks, benefits and alternatives for the proposed anesthesia with the patient or authorized representative who has indicated his/her understanding and acceptance.     Dental advisory given  Plan Discussed with: CRNA  Anesthesia Plan Comments:       Anesthesia Quick Evaluation

## 2021-02-08 NOTE — Discharge Instructions (Addendum)

## 2021-02-09 NOTE — Addendum Note (Signed)
Addendum  created 02/09/21 0818 by Yanelle Sousa, Ernesta Amble, CRNA   Charge Capture section accepted

## 2021-02-10 LAB — SURGICAL PATHOLOGY

## 2021-02-18 ENCOUNTER — Other Ambulatory Visit: Payer: Self-pay | Admitting: Family Medicine

## 2021-02-19 ENCOUNTER — Encounter: Payer: Self-pay | Admitting: Family Medicine

## 2021-02-19 ENCOUNTER — Other Ambulatory Visit: Payer: Self-pay

## 2021-02-19 ENCOUNTER — Ambulatory Visit: Payer: Managed Care, Other (non HMO) | Admitting: Family Medicine

## 2021-02-19 VITALS — BP 128/68 | HR 56 | Temp 98.2°F | Ht 74.0 in | Wt 231.4 lb

## 2021-02-19 DIAGNOSIS — I10 Essential (primary) hypertension: Secondary | ICD-10-CM | POA: Diagnosis not present

## 2021-02-19 DIAGNOSIS — E785 Hyperlipidemia, unspecified: Secondary | ICD-10-CM | POA: Diagnosis not present

## 2021-02-19 NOTE — Patient Instructions (Signed)
Keep the diet clean and stay active.  Because your blood pressure is well-controlled, you no longer have to check your blood pressure at home anymore unless you wish. Some people check it twice daily every day and some people stop altogether. Either or anything in between is fine. Strong work!  Let us know if you need anything. 

## 2021-02-19 NOTE — Progress Notes (Signed)
Chief Complaint  Patient presents with   Follow-up    Subjective Dean Guzman is a 70 y.o. male who presents for hypertension follow up. He does monitor home blood pressures. Blood pressures ranging from 130's/70's on average. He is compliant with medications- HCTZ 12.5 mg/d, lisinopril 10 mg/d, Norvasc 5 mg/d. Patient has these side effects of medication: none He is adhering to a healthy diet overall. Current exercise: walking No Cp or SOB  Hyperlipidemia Patient presents for dyslipidemia follow up. Currently being treated with Zocor 10 mg/d and compliance with treatment thus far has been good. He denies myalgias. Diet/exercise.  The patient is not known to have coexisting coronary artery disease.   Past Medical History:  Diagnosis Date   Barrett's esophagus    BPH (benign prostatic hyperplasia)    Colon polyps    Complication of anesthesia    during biopsy, felt hot and sweating   FH: factor V Leiden mutation    niece died of leiden factor five complications, sister has   Frequent headaches    GERD (gastroesophageal reflux disease)    History of kidney stones    Hyperlipidemia    Hypertension    Migraines     no recent migraines had visual aura   Prostate cancer (HCC)     Exam BP 128/68   Pulse (!) 56   Temp 98.2 F (36.8 C) (Oral)   Ht 6\' 2"  (1.88 m)   Wt 231 lb 6 oz (105 kg)   SpO2 95%   BMI 29.71 kg/m  General:  well developed, well nourished, in no apparent distress Heart: RRR, no bruits, no LE edema Lungs: clear to auscultation, no accessory muscle use Psych: well oriented with normal range of affect and appropriate judgment/insight  Essential hypertension  Hyperlipidemia, unspecified hyperlipidemia type  Chronic, stable. Cont Norvasc 5 mg/d, lisinopril 10 mg/d, HCTZ 12.5 mg/d. OK to stop checking at home. Counseled on diet and exercise. Chronic, stable. Cont Zocor 10 mg/d. F/u in 6 mo. The patient voiced understanding and agreement to the  plan.  [place], DO 02/19/21  2:42 PM

## 2021-02-22 ENCOUNTER — Encounter (HOSPITAL_BASED_OUTPATIENT_CLINIC_OR_DEPARTMENT_OTHER): Payer: Self-pay | Admitting: Orthopedic Surgery

## 2021-03-15 ENCOUNTER — Other Ambulatory Visit: Payer: Self-pay | Admitting: Family Medicine

## 2021-03-15 DIAGNOSIS — I10 Essential (primary) hypertension: Secondary | ICD-10-CM

## 2021-03-16 ENCOUNTER — Telehealth: Payer: Self-pay

## 2021-03-16 NOTE — Telephone Encounter (Signed)
Will call back for clarification.

## 2021-03-16 NOTE — Telephone Encounter (Signed)
Nurse Assessment Nurse: Harvie Bridge, RN, Beth Date/Time (Eastern Time): 03/15/2021 10:08:05 PM Confirm and document reason for call. If symptomatic, describe symptoms. ---Caller states needs prescription clarification. [facility] [employer] [phone] Does the patient have any new or worsening symptoms? ---No Please document clinical information provided and list any resource used. ---I called back and [facility] closed at [place], so they called just before they closed. Office will need to call back in am, they open at [place]. Time Eilene Ghazi Time) Disposition Final User 03/15/2021 9:59:40 PM Send To Nurse Graylon Gunning, RN, Rhonda 03/15/2021 10:10:52 PM Pharmacy Call Newhart, RN, Beth Reason: I called and they were closed for the night. 03/15/2021 10:10:34 PM Clinical Call Yes Newhart, RN, [employer]

## 2021-04-08 ENCOUNTER — Other Ambulatory Visit: Payer: Self-pay

## 2021-05-03 DIAGNOSIS — H2513 Age-related nuclear cataract, bilateral: Secondary | ICD-10-CM | POA: Diagnosis not present

## 2021-05-15 ENCOUNTER — Other Ambulatory Visit: Payer: Self-pay | Admitting: Family Medicine

## 2021-05-24 ENCOUNTER — Other Ambulatory Visit: Payer: Self-pay | Admitting: Family Medicine

## 2021-05-24 DIAGNOSIS — I10 Essential (primary) hypertension: Secondary | ICD-10-CM

## 2021-05-28 ENCOUNTER — Telehealth: Payer: Self-pay | Admitting: Family Medicine

## 2021-05-28 DIAGNOSIS — I10 Essential (primary) hypertension: Secondary | ICD-10-CM

## 2021-05-28 MED ORDER — LISINOPRIL 10 MG PO TABS: 10.0000 mg | ORAL_TABLET | Freq: Every day | ORAL | 0 refills | Status: DC

## 2021-05-28 NOTE — Telephone Encounter (Signed)
Medication:  lisinopril (ZESTRIL) 10 MG tablet [[id number]]    Has the patient contacted their pharmacy? Yes.   (If no, request that the patient contact the pharmacy for the refill.) (If yes, when and what did the pharmacy advise?) Dyann Kief from [facility] contacted regarding pt's med refill     Preferred Pharmacy (with phone number or street name):  [facility] [place] [address]  Phone:  [phone]  Fax:  [phone]     Agent: Please be advised that RX refills may take up to 3 business days. We ask that you follow-up with your pharmacy.

## 2021-05-28 NOTE — Telephone Encounter (Signed)
Refill done.  

## 2021-06-04 ENCOUNTER — Encounter: Payer: Self-pay | Admitting: Family Medicine

## 2021-06-24 ENCOUNTER — Other Ambulatory Visit: Payer: Self-pay | Admitting: Family Medicine

## 2021-06-29 ENCOUNTER — Other Ambulatory Visit: Payer: Self-pay | Admitting: Family Medicine

## 2021-06-29 ENCOUNTER — Encounter: Payer: Self-pay | Admitting: Family Medicine

## 2021-06-29 ENCOUNTER — Ambulatory Visit (INDEPENDENT_AMBULATORY_CARE_PROVIDER_SITE_OTHER): Payer: PPO | Admitting: Family Medicine

## 2021-06-29 VITALS — BP 132/72 | HR 64 | Temp 98.0°F | Ht 74.0 in | Wt 241.0 lb

## 2021-06-29 DIAGNOSIS — Z0000 Encounter for general adult medical examination without abnormal findings: Secondary | ICD-10-CM | POA: Diagnosis not present

## 2021-06-29 DIAGNOSIS — R197 Diarrhea, unspecified: Secondary | ICD-10-CM | POA: Diagnosis not present

## 2021-06-29 DIAGNOSIS — E785 Hyperlipidemia, unspecified: Secondary | ICD-10-CM

## 2021-06-29 DIAGNOSIS — R42 Dizziness and giddiness: Secondary | ICD-10-CM

## 2021-06-29 DIAGNOSIS — Z23 Encounter for immunization: Secondary | ICD-10-CM

## 2021-06-29 DIAGNOSIS — I10 Essential (primary) hypertension: Secondary | ICD-10-CM | POA: Diagnosis not present

## 2021-06-29 DIAGNOSIS — Z136 Encounter for screening for cardiovascular disorders: Secondary | ICD-10-CM | POA: Diagnosis not present

## 2021-06-29 LAB — TSH: TSH: 1.18 u[IU]/mL (ref 0.35–5.50)

## 2021-06-29 LAB — COMPREHENSIVE METABOLIC PANEL
ALT: 22 U/L (ref 0–53)
AST: 18 U/L (ref 0–37)
Albumin: 4.5 g/dL (ref 3.5–5.2)
Alkaline Phosphatase: 59 U/L (ref 39–117)
BUN: 22 mg/dL (ref 6–23)
CO2: 33 mEq/L — ABNORMAL HIGH (ref 19–32)
Calcium: 9.7 mg/dL (ref 8.4–10.5)
Chloride: 99 mEq/L (ref 96–112)
Creatinine, Ser: 1.31 mg/dL (ref 0.40–1.50)
GFR: 55.04 mL/min — ABNORMAL LOW (ref >60.00)
Glucose, Bld: 101 mg/dL — ABNORMAL HIGH (ref 70–99)
Potassium: 4.6 mEq/L (ref 3.5–5.1)
Sodium: 137 mEq/L (ref 135–145)
Total Bilirubin: 0.7 mg/dL (ref 0.2–1.2)
Total Protein: 7.1 g/dL (ref 6.0–8.3)

## 2021-06-29 LAB — LIPID PANEL
Cholesterol: 177 mg/dL (ref 0–200)
HDL: 42.2 mg/dL (ref >39.00)
LDL Cholesterol: 112 mg/dL — ABNORMAL HIGH (ref 0–99)
NonHDL: 134.48
Total CHOL/HDL Ratio: 4
Triglycerides: 110 mg/dL (ref 0.0–149.0)
VLDL: 22 mg/dL (ref 0.0–40.0)

## 2021-06-29 LAB — CBC
HCT: 44.5 % (ref 39.0–52.0)
Hemoglobin: 14.9 g/dL (ref 13.0–17.0)
MCHC: 33.4 g/dL (ref 30.0–36.0)
MCV: 89 fl (ref 78.0–100.0)
Platelets: 182 10*3/uL (ref 150.0–400.0)
RBC: 5 Mil/uL (ref 4.22–5.81)
RDW: 13.4 % (ref 11.5–15.5)
WBC: 6.2 10*3/uL (ref 4.0–10.5)

## 2021-06-29 MED ORDER — LISINOPRIL-HYDROCHLOROTHIAZIDE 10-12.5 MG PO TABS: 1.0000 | ORAL_TABLET | Freq: Every day | ORAL | 3 refills | Status: DC

## 2021-06-29 NOTE — Progress Notes (Signed)
Subjective:   Dean Guzman is a 71 y.o. male who presents for an Initial Medicare Annual Wellness Visit.  2 weeks of feeling lightheaded. Usually standing when it happens. Not spinning and has not passed out. Position changes do not affect it. He has been eating/drinking normally. No diarrhea, fevers, recent illness, urinary complaints. No supplement/medication changes, palpitations, illicit drug use, alcohol use.   Review of Systems    Lightheaded intermittently 10 pt ROS otherwise neg       Objective:    Today's Vitals   06/29/21 1019  BP: 131/78  Pulse: 64  Temp: 98 F (36.7 C)  TempSrc: Oral  SpO2: 97%  Weight: 241 lb (109.3 kg)  Height: 6\' 2"  (1.88 m)   Body mass index is 30.94 kg/m.  Advanced Directives 02/08/2021 02/02/2021 12/07/2020 02/06/2020 01/13/2020 10/29/2019  Does Patient Have a Medical Advance Directive? Yes - Yes No Yes No  Type of Advance Directive Healthcare Power of Dean Guzman -  Does patient want to make changes to medical advance directive? No - Patient declined No - Patient declined - - No - Patient declined -  Copy of [place] in Chart? No - copy requested - No - copy requested - No - copy requested -  Would patient like information on creating a medical advance directive? - - - No - Patient declined - Yes (MAU/Ambulatory/Procedural Areas - Information given)    Current Medications (verified) Outpatient Encounter Medications as of 06/29/2021  Medication Sig   amLODipine (NORVASC) 5 MG tablet Take 1 tablet by mouth daily.   cholecalciferol (VITAMIN D3) 25 MCG (1000 UT) tablet Take 1,000 Units by mouth daily.   diltiazem (DILACOR XR) 240 MG 24 hr capsule Take 1 capsule by mouth daily.   hydrochlorothiazide (MICROZIDE) 12.5 MG capsule Take 1 capsule by mouth daily.   ipratropium (ATROVENT) 0.06 % nasal spray Instill 2 sprays into each nostril three times daily as needed for  rhinitis.   lisinopril (ZESTRIL) 10 MG tablet Take 1 tablet (10 mg total) by mouth daily.   Omega-3 Fatty Acids (FISH OIL) 1200 MG CAPS Take 1 capsule by mouth 2 (two) times daily.   pantoprazole (PROTONIX) 40 MG tablet Take 40 mg by mouth 2 (two) times daily.   simvastatin (ZOCOR) 10 MG tablet Take 1 tablet by mouth daily at 6 pm.   tadalafil (CIALIS) 5 MG tablet Take 5 mg by mouth daily as needed for erectile dysfunction.   [DISCONTINUED] HYDROcodone-acetaminophen (NORCO) 5-325 MG tablet 1-2 tabs po q6 hours prn pain   [DISCONTINUED] pramoxine (PROCTOFOAM) 1 % foam Place 1 application rectally 3 (three) times daily as needed for anal itching.   Allergies (verified) Patient has no known allergies.   History: Past Medical History:  Diagnosis Date   Barrett's esophagus    BPH (benign prostatic hyperplasia)    Colon polyps    Complication of anesthesia    during biopsy, felt hot and sweating   FH: factor V Leiden mutation    niece died of leiden factor five complications, sister has   Frequent headaches    GERD (gastroesophageal reflux disease)    History of kidney stones    Hyperlipidemia    Hypertension    Migraines     no recent migraines had visual aura   Prostate cancer [facility])    Past Surgical History:  Procedure Laterality Date   colonscopy     CYSTOSCOPY N/A  01/13/2020   Procedure: CYSTOSCOPY FLEXIBLE;  Surgeon: Dean Mallow, MD;  Location: [facility];  Service: Urology;  Laterality: N/A;  NO SEEDS FOUND IN BLADDER   MASS EXCISION Left 02/08/2021   Procedure: EXCISION MASS OF MUCOID CYST WITH DISTAL INTERPHANGEAL JOINT ARTHROTOMY LEFT MIDDLE FINGER;  Surgeon: Dean Cover, MD;  Location: [facility];  Service: Orthopedics;  Laterality: Left;   NASAL ENDOSCOPY     PROSTATE BIOPSY  may 2021 and 2020   RADIOACTIVE SEED IMPLANT N/A 01/13/2020   Procedure: RADIOACTIVE SEED IMPLANT/BRACHYTHERAPY IMPLANT;  Surgeon: Dean Mallow, MD;   Location: [place];  Service: Urology;  Laterality: N/A;    73 SEEDS IMPLANTED   SPACE OAR INSTILLATION N/A 01/13/2020   Procedure: SPACE OAR INSTILLATION;  Surgeon: Dean Mallow, MD;  Location: [facility];  Service: Urology;  Laterality: N/A;   Family History  Problem Relation Age of Onset   Arthritis Mother    Heart disease Father    Hyperlipidemia Father    Hypertension Father    Breast cancer Sister    Diabetes Maternal Grandmother    Hearing loss Paternal Grandmother    Early death Paternal Grandmother    Hearing loss Paternal Grandfather    Early death Paternal Grandfather    Allergic rhinitis Neg Hx    Asthma Neg Hx    Eczema Neg Hx    Urticaria Neg Hx    Prostate cancer Neg Hx    Colon cancer Neg Hx    Pancreatic cancer Neg Hx    Social History   Socioeconomic History   Marital status: Married   Number of children: 3  Occupational History    Comment: full time  Tobacco Use   Smoking status: Former    Packs/day: 1.00    Years: 12.00    Pack years: 12.00    Types: Cigarettes    Quit date: 05/30/1978    Years since quitting: 43.1   Smokeless tobacco: Never  Vaping Use   Vaping Use: Never used  Substance and Sexual Activity   Alcohol use: Yes    Comment: rarely   Drug use: Not Currently   Sexual activity: Yes   Tobacco Counseling N/A  Diabetic?No  Activities of Daily Living In your present state of health, do you have any difficulty performing the following activities: 02/08/2021 08/17/2020  Hearing? N N  Vision? N N  Difficulty concentrating or making decisions? N N  Walking or climbing stairs? N N  Dressing or bathing? N N  Doing errands, shopping? - N  Some recent data might be hidden    Patient Care Team: Dean Pal, DO as PCP - General (Family Medicine)  Indicate any recent Medical Services you may have received from other than [facility] providers in the past year (date may be approximate).      Assessment:   This is a routine wellness examination for Dean Guzman.  Hearing/Vision screen Hearing Screening   500Hz  1000Hz  2000Hz  4000Hz   Right ear Pass Pass Pass Pass  Left ear Pass Pass Pass Pass   Vision Screening   Right eye Left eye Both eyes  Without correction     With correction 20/25 20/20 20/20     Dietary issues and exercise activities discussed:     Goals Addressed   None    Depression Screen PHQ 2/9 Scores 06/29/2021 08/17/2020  PHQ - 2 Score 0 0    Fall Risk Fall  Risk  06/29/2021  Falls in the past year? 0  Number falls in past yr: 0  Injury with Fall? 0  Risk for fall due to : No Fall Risks  Follow up Falls evaluation completed    [place]:  Any stairs in or around the home? Yes  If so, are there any without handrails? No  Home free of loose throw rugs in walkways, pet beds, electrical cords, etc? Yes  Adequate lighting in your home to reduce risk of falls? Yes   ASSISTIVE DEVICES UTILIZED TO PREVENT FALLS:  Life alert? No  Use of a cane, walker or w/c? No  Grab bars in the bathroom? No  Shower chair or bench in shower? No  Elevated toilet seat or a handicapped toilet? No   TIMED UP AND GO:  Was the test performed? Yes .  Length of time to ambulate 10 feet: 9 sec.   Gait steady and fast with assistive device  Cognitive Function: A&Ox4  Immunizations Immunization History  Administered Date(s) Administered   Fluad Quad(high Dose 65+) 03/01/2020   Influenza, High Dose Seasonal PF 02/15/2021   Influenza-Unspecified 02/28/2019   PFIZER(Purple Top)SARS-COV-2 Vaccination 06/18/2019, 07/09/2019, 01/16/2020, 02/15/2021   Pneumococcal Polysaccharide-23 12/11/2018   Zoster Recombinat (Shingrix) 08/29/2018, 10/29/2018    TDAP status: Up to date  Flu Vaccine status: Up to date  Pneumococcal vaccine status: Up to date; will update with PCV20 today.   Covid-19 vaccine status: Completed vaccines  Qualifies for  Shingles Vaccine? Yes   Zostavax completed No   Shingrix Completed?: Yes  Screening Tests Health Maintenance  Topic Date Due   Pneumonia Vaccine 62+ Years old (2 - PCV) 12/11/2019   TETANUS/TDAP  12/11/2023   COLONOSCOPY (Pts 45-63yrs Insurance coverage will need to be confirmed)  09/09/2029   INFLUENZA VACCINE  Completed   Hepatitis C Screening  Completed   Zoster Vaccines- Shingrix  Completed   HPV VACCINES  Aged Out   COVID-19 Vaccine  Discontinued    Health Maintenance  Health Maintenance Due  Topic Date Due   Pneumonia Vaccine 30+ Years old (2 - PCV) 12/11/2019    Colorectal cancer screening: Type of screening: Colonoscopy. Completed 09/10/19. Repeat every 10 years  Lung Cancer Screening: (Low Dose CT Chest recommended if Age 5-80 years, 30 pack-year currently smoking OR have quit w/in 15years.) does not qualify.   Lung Cancer Screening Referral: N/A  Additional Screening:  Hepatitis C Screening: does qualify; Completed 11/06/17  Vision Screening: Recommended annual ophthalmology exams for early detection of glaucoma and other disorders of the eye. Is the patient up to date with their annual eye exam?  Yes  Who is the provider or what is the name of the office in which the patient attends annual eye exams? [facility] If pt is not established with a provider, would they like to be referred to a provider to establish care?  N/A .   Dental Screening: Recommended annual dental exams for proper oral hygiene  Community Resource Referral / Chronic Care Management: CRR required this visit?  No   CCM required this visit?  No   Care team PCP: Dean Pal, DO GI: Wilford Corner, MD Orthopod: Dean Cover, MD Urology: Link Snuffer III, MD Ophthalmology: [facility]  Plan:     I have personally reviewed and noted the following in the patients chart:   Medical and social history Use of alcohol, tobacco or illicit drugs  Current  medications and supplements including opioid prescriptions. Patient is not currently taking opioid prescriptions. Functional ability and status Nutritional status Physical activity Advanced directive document provided today, copy requested.  List of other physicians Hospitalizations, surgeries, and ER visits in previous 12 months Vitals Screenings to include cognitive, depression, and falls Referrals and appointments  Lightheadedness. New problem, uncertain prog. Orthostatics, EKG, labs today. Not classic orthostasis given s/s's. If labs neg, will refer to cards. Stay hydrated.   In addition, I have reviewed and discussed with patient certain preventive protocols, quality metrics, and best practice recommendations. A written personalized care plan for preventive services as well as general preventive health recommendations were provided to patient.     [place], DO   06/29/2021

## 2021-06-29 NOTE — Patient Instructions (Signed)
Give [place] 2-3 business days to get the results of your labs back.   Keep the diet clean and stay active.  Stay hydrated.  If labs are normal, we will refer you to the cardiology team.  Let us know if you need anything.

## 2021-06-29 NOTE — Progress Notes (Signed)
referral

## 2021-07-16 DIAGNOSIS — T8859XA Other complications of anesthesia, initial encounter: Secondary | ICD-10-CM | POA: Insufficient documentation

## 2021-07-16 DIAGNOSIS — K2270 Barrett's esophagus without dysplasia: Secondary | ICD-10-CM | POA: Insufficient documentation

## 2021-07-16 DIAGNOSIS — K635 Polyp of colon: Secondary | ICD-10-CM | POA: Insufficient documentation

## 2021-07-16 DIAGNOSIS — Z832 Family history of diseases of the blood and blood-forming organs and certain disorders involving the immune mechanism: Secondary | ICD-10-CM | POA: Insufficient documentation

## 2021-07-16 DIAGNOSIS — Z87442 Personal history of urinary calculi: Secondary | ICD-10-CM | POA: Insufficient documentation

## 2021-07-16 DIAGNOSIS — G43909 Migraine, unspecified, not intractable, without status migrainosus: Secondary | ICD-10-CM | POA: Insufficient documentation

## 2021-07-16 DIAGNOSIS — N400 Enlarged prostate without lower urinary tract symptoms: Secondary | ICD-10-CM | POA: Insufficient documentation

## 2021-07-16 DIAGNOSIS — R519 Headache, unspecified: Secondary | ICD-10-CM | POA: Insufficient documentation

## 2021-07-16 HISTORY — DX: Barrett's esophagus without dysplasia: K22.70

## 2021-07-21 ENCOUNTER — Ambulatory Visit (INDEPENDENT_AMBULATORY_CARE_PROVIDER_SITE_OTHER): Payer: PPO

## 2021-07-21 ENCOUNTER — Encounter: Payer: Self-pay | Admitting: Cardiology

## 2021-07-21 ENCOUNTER — Ambulatory Visit: Payer: PPO | Admitting: Cardiology

## 2021-07-21 ENCOUNTER — Other Ambulatory Visit: Payer: Self-pay

## 2021-07-21 VITALS — BP 158/86 | HR 70 | Ht 74.0 in | Wt 244.0 lb

## 2021-07-21 DIAGNOSIS — I10 Essential (primary) hypertension: Secondary | ICD-10-CM

## 2021-07-21 DIAGNOSIS — R55 Syncope and collapse: Secondary | ICD-10-CM

## 2021-07-21 DIAGNOSIS — E782 Mixed hyperlipidemia: Secondary | ICD-10-CM | POA: Diagnosis not present

## 2021-07-21 HISTORY — DX: Syncope and collapse: R55

## 2021-07-21 MED ORDER — ROSUVASTATIN CALCIUM 5 MG PO TABS: 5.0000 mg | ORAL_TABLET | Freq: Every day | ORAL | 3 refills | Status: DC

## 2021-07-21 NOTE — Progress Notes (Signed)
Cardiology Consultation:    Date:  07/21/2021   ID:  Dean Guzman, DOB [birth year], MRN [id number]  PCP:  Shelda Pal, DO  Cardiologist:  Jenne Campus, MD   Referring MD: Shelda Pal*   No chief complaint on file. I have dizzy spells  History of Present Illness:    Dean Guzman is a 71 y.o. male who is being seen today for the evaluation of dizziness, near syncope at the request of Shelda Pal*.  Past medical history significant for essential hypertension, dyslipidemia, factor V Leiden mutation, for the last 2 months he noted some dizziness.  Recommended that he continue walking and suddenly started feeling like he is going to pass out, many times he need to lean over something before he goes on the ground however he never completely passed out he never fell down he never injured himself.  That sensation can last between 10 to 20 minutes.  It happened also when he was sitting it never happened when he is lying down.  Changing position of the valley does not change any sensation.  There is no associated symptoms with this meaning he does not have any swelling there is no nausea vomiting there is no shortness of breath no chest pain. Overall he is doing very well.  He exercise on the regular basis he walks 2 dogs I have no difficulty.  He is usually 2 to 3 miles every day he did not notice any decrease in ability to exercise. He never had any heart problem He does have family history of coronary artery disease but not premature He quit smoking in the 80s after he left the [employer]  Past Medical History:  Diagnosis Date   Barrett's esophagus    BPH (benign prostatic hyperplasia)    Colon polyps    Complication of anesthesia    during biopsy, felt hot and sweating   FH: factor V Leiden mutation    niece died of leiden factor five complications, sister has   Frequent headaches    GERD (gastroesophageal reflux disease)    History of kidney stones     Hyperlipidemia    Hypertension    Migraines     no recent migraines had visual aura   Prostate cancer [facility])     Past Surgical History:  Procedure Laterality Date   colonscopy     CYSTOSCOPY N/A 01/13/2020   Procedure: CYSTOSCOPY FLEXIBLE;  Surgeon: Lucas Mallow, MD;  Location: [facility];  Service: Urology;  Laterality: N/A;  NO SEEDS FOUND IN BLADDER   MASS EXCISION Left 02/08/2021   Procedure: EXCISION MASS OF MUCOID CYST WITH DISTAL INTERPHANGEAL JOINT ARTHROTOMY LEFT MIDDLE FINGER;  Surgeon: [name], MD;  Location: [place];  Service: Orthopedics;  Laterality: Left;   NASAL ENDOSCOPY     PROSTATE BIOPSY  may 2021 and 2020   RADIOACTIVE SEED IMPLANT N/A 01/13/2020   Procedure: RADIOACTIVE SEED IMPLANT/BRACHYTHERAPY IMPLANT;  Surgeon: Lucas Mallow, MD;  Location: [place];  Service: Urology;  Laterality: N/A;    73 SEEDS IMPLANTED   SPACE OAR INSTILLATION N/A 01/13/2020   Procedure: SPACE OAR INSTILLATION;  Surgeon: Lucas Mallow, MD;  Location: [name];  Service: Urology;  Laterality: N/A;    Current Medications: Current Meds  Medication Sig   amLODipine (NORVASC) 5 MG tablet Take 1 tablet by mouth daily.   cholecalciferol (VITAMIN D3) 25 MCG (1000 UT) tablet Take  1,000 Units by mouth daily.   diltiazem (DILACOR XR) 240 MG 24 hr capsule Take 1 capsule by mouth daily.   ipratropium (ATROVENT) 0.06 % nasal spray Instill 2 sprays into each nostril three times daily as needed for rhinitis.   lisinopril-hydrochlorothiazide (ZESTORETIC) 10-12.5 MG tablet Take 1 tablet by mouth daily.   Omega-3 Fatty Acids (FISH OIL) 1200 MG CAPS Take 1 capsule by mouth 2 (two) times daily.   pantoprazole (PROTONIX) 40 MG tablet Take 40 mg by mouth 2 (two) times daily.   simvastatin (ZOCOR) 10 MG tablet Take 1 tablet by mouth daily at 6 pm.   tadalafil (CIALIS) 5 MG tablet Take 5 mg by mouth daily as needed for  erectile dysfunction.     Allergies:   Patient has no known allergies.   Social History   Socioeconomic History   Marital status: Married    Spouse name: Not on file   Number of children: 3   Years of education: Not on file   Highest education level: Not on file  Occupational History    Comment: full time  Tobacco Use   Smoking status: Former    Packs/day: 1.00    Years: 12.00    Pack years: 12.00    Types: Cigarettes    Quit date: 05/30/1978    Years since quitting: 43.1   Smokeless tobacco: Never  Vaping Use   Vaping Use: Never used  Substance and Sexual Activity   Alcohol use: Yes    Comment: rarely   Drug use: Not Currently   Sexual activity: Yes  Other Topics Concern   Not on file  Social History Narrative   Not on file   Social Determinants of Health   Financial Resource Strain: Not on file  Food Insecurity: Not on file  Transportation Needs: Not on file  Physical Activity: Not on file  Stress: Not on file  Social Connections: Not on file     Family History: The patient's family history includes Arthritis in [place] Guzy's mother; Breast cancer in [place]'s sister; Diabetes in [place]'s maternal grandmother; Early death in [place]'s paternal grandfather and paternal grandmother; Hearing loss in [employer] Gali's paternal grandfather and paternal grandmother; Heart disease in [place]'s father; Hyperlipidemia in [place] Marsan's father; Hypertension in [place] Bais's father. There is no history of Allergic rhinitis, Asthma, Eczema, Urticaria, Prostate cancer, Colon cancer, or Pancreatic cancer. ROS:   Please see the history of present illness.    All 14 point review of systems negative except as described per history of present illness.  EKGs/Labs/Other Studies Reviewed:    The following studies were reviewed today:   EKG:  EKG is  ordered today.  The ekg ordered today demonstrates normal sinus rhythm normal P interval left  axis.  Recent Labs: 06/29/2021: ALT 22; BUN 22; Creatinine, Ser 1.31; Hemoglobin 14.9; Platelets 182.0; Potassium 4.6; Sodium 137; TSH 1.18  Recent Lipid Panel    Component Value Date/Time   CHOL 177 06/29/2021 1107   TRIG 110.0 06/29/2021 1107   HDL 42.20 06/29/2021 1107   CHOLHDL 4 06/29/2021 1107   VLDL 22.0 06/29/2021 1107   LDLCALC 112 (H) 06/29/2021 1107    Physical Exam:    VS:  BP (!) 158/86 (BP Location: Left Arm)    Pulse 70    Ht 6\' 2"  (1.88 m)    Wt 244 lb (110.7 kg)    SpO2 98%    BMI 31.33 kg/m     Wt  Readings from Last 3 Encounters:  07/21/21 244 lb (110.7 kg)  06/29/21 241 lb (109.3 kg)  02/19/21 231 lb 6 oz (105 kg)     GEN:  Well nourished, well developed in no acute distress HEENT: Normal NECK: No JVD; No carotid bruits LYMPHATICS: No lymphadenopathy CARDIAC: RRR, no murmurs, no rubs, no gallops RESPIRATORY:  Clear to auscultation without rales, wheezing or rhonchi  ABDOMEN: Soft, non-tender, non-distended MUSCULOSKELETAL:  No edema; No deformity  SKIN: Warm and dry NEUROLOGIC:  Alert and oriented x 3 PSYCHIATRIC:  Normal affect   ASSESSMENT:    1. Essential hypertension   2. Near syncope   3. Mixed hyperlipidemia    PLAN:    In order of problems listed above:  Near syncope.  I will ask him to wear Zio patch for 2 weeks to see if you have any significant arrhythmia.  As a part of evaluation echocardiogram will be done to assess his heart structurally. Essential hypertension.  His blood pressure elevated today but this is first visit in the office.  He said it usually is much better.  We will continue monitoring it.  Echocardiogram will be done which also tell me about potentially having left ventricle hypertrophy which will first need to intensify his medical therapy. Mixed dyslipidemia I did calculate his 10-year predicted risk which was 22.3.  Which is high.  He is taking simvastatin 10 before he got trial of simvastatin 20, developed some muscle  aches.  He accepted my offer trying Crestor 5 mg daily.  If he is not able to tolerate that statin then we go a different route with nonstatin therapy for his cholesterol. We did talk about healthy lifestyle and he overall doing very well he is trying to stick with a good diet he is exercising on a regular basis.   Medication Adjustments/Labs and Tests Ordered: Current medicines are reviewed at length with the patient today.  Concerns regarding medicines are outlined above.  No orders of the defined types were placed in this encounter.  No orders of the defined types were placed in this encounter.   Signed, Park Liter, MD, [facility]. 07/21/2021 9:04 AM    [place]

## 2021-07-21 NOTE — Patient Instructions (Signed)
Medication Instructions:  Your physician has recommended you make the following change in your medication:   START: Crestor 5 mg daily STOP: Simvistatin  *If you need a refill on your cardiac medications before your next appointment, please call your pharmacy*   Lab Work: None If you have labs (blood work) drawn today and your tests are completely normal, you will receive your results only by: [place] (if you have MyChart) OR A paper copy in the mail If you have any lab test that is abnormal or we need to change your treatment, we will call you to review the results.   Testing/Procedures: Your physician has requested that you have an echocardiogram. Echocardiography is a painless test that uses sound waves to create images of your heart. It provides your doctor with information about the size and shape of your heart and how well your hearts chambers and valves are working. This procedure takes approximately one hour. There are no restrictions for this procedure.  A zio monitor was ordered today. It will remain on for 14 days. You will then return monitor and event diary in provided box. It takes 1-2 weeks for report to be downloaded and returned to [place]. We will call you with the results. If monitor falls off or has orange flashing light, please call Zio for further instructions.     Follow-Up: At [facility], you and your health needs are our priority.  As part of our continuing mission to provide you with exceptional heart care, we have created designated Provider Care Teams.  These Care Teams include your primary Cardiologist (physician) and Advanced Practice Providers (APPs -  Physician Assistants and Nurse Practitioners) who all work together to provide you with the care you need, when you need it.  We recommend signing up for the patient portal called "MyChart".  Sign up information is provided on this After Visit Summary.  MyChart is used to connect with patients for  Virtual Visits (Telemedicine).  Patients are able to view lab/test results, encounter notes, upcoming appointments, etc.  Non-urgent messages can be sent to your provider as well.   To learn more about what you can do with MyChart, go to [email or web address].    Your next appointment:   2 month(s)  The format for your next appointment:   In Person  Provider:   Jenne Campus, MD    Other Instructions None

## 2021-07-21 NOTE — Addendum Note (Signed)
Addended by: Edwyna Shell I on: 07/21/2021 09:26 AM   Modules accepted: Orders

## 2021-07-22 ENCOUNTER — Ambulatory Visit (HOSPITAL_BASED_OUTPATIENT_CLINIC_OR_DEPARTMENT_OTHER)
Admission: RE | Admit: 2021-07-22 | Discharge: 2021-07-22 | Disposition: A | Discharge status: Home / Self Care | Payer: PPO | Source: Ambulatory Visit | Attending: Cardiology | Admitting: Cardiology

## 2021-07-22 DIAGNOSIS — R55 Syncope and collapse: Secondary | ICD-10-CM | POA: Diagnosis not present

## 2021-07-22 DIAGNOSIS — I10 Essential (primary) hypertension: Secondary | ICD-10-CM | POA: Insufficient documentation

## 2021-07-22 DIAGNOSIS — E782 Mixed hyperlipidemia: Secondary | ICD-10-CM | POA: Insufficient documentation

## 2021-07-22 LAB — ECHOCARDIOGRAM COMPLETE
Area-P 1/2: 2.68 cm2
S' Lateral: 3.3 cm

## 2021-07-22 NOTE — Progress Notes (Incomplete)
{  Select Note:[id number]} 

## 2021-07-22 NOTE — Progress Notes (Signed)
°  Echocardiogram 2D Echocardiogram has been performed.  Dean Guzman F 07/22/2021, 10:13 AM

## 2021-07-25 ENCOUNTER — Other Ambulatory Visit: Payer: Self-pay | Admitting: Family Medicine

## 2021-07-26 ENCOUNTER — Encounter: Payer: Self-pay | Admitting: Cardiology

## 2021-07-26 ENCOUNTER — Other Ambulatory Visit: Payer: Self-pay | Admitting: Family Medicine

## 2021-07-26 MED ORDER — LISINOPRIL-HYDROCHLOROTHIAZIDE 10-12.5 MG PO TABS: 1.0000 | ORAL_TABLET | Freq: Every day | ORAL | 3 refills | Status: DC

## 2021-07-29 DIAGNOSIS — C61 Malignant neoplasm of prostate: Secondary | ICD-10-CM | POA: Diagnosis not present

## 2021-08-02 ENCOUNTER — Other Ambulatory Visit: Payer: Self-pay | Admitting: Family Medicine

## 2021-08-05 DIAGNOSIS — R3915 Urgency of urination: Secondary | ICD-10-CM | POA: Diagnosis not present

## 2021-08-05 DIAGNOSIS — C61 Malignant neoplasm of prostate: Secondary | ICD-10-CM | POA: Diagnosis not present

## 2021-08-05 DIAGNOSIS — N5201 Erectile dysfunction due to arterial insufficiency: Secondary | ICD-10-CM | POA: Diagnosis not present

## 2021-08-05 DIAGNOSIS — N401 Enlarged prostate with lower urinary tract symptoms: Secondary | ICD-10-CM | POA: Diagnosis not present

## 2021-08-05 DIAGNOSIS — R350 Frequency of micturition: Secondary | ICD-10-CM | POA: Diagnosis not present

## 2021-08-11 DIAGNOSIS — R55 Syncope and collapse: Secondary | ICD-10-CM | POA: Diagnosis not present

## 2021-08-11 DIAGNOSIS — E782 Mixed hyperlipidemia: Secondary | ICD-10-CM | POA: Diagnosis not present

## 2021-08-11 DIAGNOSIS — I10 Essential (primary) hypertension: Secondary | ICD-10-CM | POA: Diagnosis not present

## 2021-08-17 ENCOUNTER — Encounter: Payer: Self-pay | Admitting: Cardiology

## 2021-08-17 DIAGNOSIS — I471 Supraventricular tachycardia: Secondary | ICD-10-CM

## 2021-08-17 DIAGNOSIS — I4729 Other ventricular tachycardia: Secondary | ICD-10-CM

## 2021-08-19 ENCOUNTER — Other Ambulatory Visit: Payer: Self-pay

## 2021-08-19 ENCOUNTER — Telehealth: Payer: Self-pay

## 2021-08-19 NOTE — Telephone Encounter (Signed)
The call center sent a call to me from this pt who is very upset. The pt states that he has been waiting over a week for his results on his monitor as he saw them in his MyChart. Pt wanted to know what the timeframe was to get a call and I explained that Dr. Agustin Cree had several patients that he has to do results on. Pt then expressed his concern that he sent a MyChart message and has no received any response to it as well. Pt is aware of the results and need for stress test. Thought you might want to know in case you would like to call him. ?

## 2021-08-19 NOTE — Telephone Encounter (Addendum)
Called patient and he was very concerned about his test and the questions he had regarding his test results. I reviewed the results of his tests with him and explained that Dr. Agustin Cree wanted to scheduled an exercise stress test for him based on the results of his previous test. Patient was agreeable with this and had no further questions. ?

## 2021-08-23 ENCOUNTER — Telehealth (HOSPITAL_COMMUNITY): Payer: Self-pay | Admitting: *Deleted

## 2021-08-23 NOTE — Telephone Encounter (Signed)
Patient given detailed instructions per Myocardial Perfusion Study Information Sheet for the test on 08/25/21 Patient notified to arrive 15 minutes early and that it is imperative to arrive on time for appointment to keep from having the test rescheduled. ? If you need to cancel or reschedule your appointment, please call the office within 24 hours of your appointment. . Patient verbalized understanding. Dean Guzman [name] ? ? ?

## 2021-08-25 ENCOUNTER — Other Ambulatory Visit: Payer: Self-pay

## 2021-08-25 ENCOUNTER — Ambulatory Visit (HOSPITAL_COMMUNITY): Payer: PPO | Attending: Internal Medicine

## 2021-08-25 DIAGNOSIS — I4729 Other ventricular tachycardia: Secondary | ICD-10-CM | POA: Insufficient documentation

## 2021-08-25 DIAGNOSIS — R079 Chest pain, unspecified: Secondary | ICD-10-CM

## 2021-08-25 DIAGNOSIS — I471 Supraventricular tachycardia: Secondary | ICD-10-CM | POA: Diagnosis not present

## 2021-08-25 LAB — MYOCARDIAL PERFUSION IMAGING
Angina Index: 0
Duke Treadmill Score: 6
Estimated workload: 7
Exercise duration (min): 5 min
Exercise duration (sec): 45 s
LV dias vol: 84 mL (ref 62–150)
LV sys vol: 38 mL
MPHR: 150 {beats}/min
Nuc Stress EF: 54 %
Peak HR: 141 {beats}/min
Percent HR: 94 %
RPE: 19
Rest HR: 60 {beats}/min
Rest Nuclear Isotope Dose: 10.4 mCi
SDS: 3
SRS: 4
SSS: 7
ST Depression (mm): 0 mm
Stress Nuclear Isotope Dose: 30.5 mCi
TID: 0.85

## 2021-08-25 MED ORDER — TECHNETIUM TC 99M TETROFOSMIN IV KIT
10.4000 | PACK | Freq: Once | INTRAVENOUS | Status: AC | PRN
  Administered 2021-08-25: 10.4 via INTRAVENOUS
  Filled 2021-08-25: qty 11

## 2021-08-25 MED ORDER — TECHNETIUM TC 99M TETROFOSMIN IV KIT
30.5000 | PACK | Freq: Once | INTRAVENOUS | Status: AC | PRN
  Administered 2021-08-25: 30.5 via INTRAVENOUS
  Filled 2021-08-25: qty 31

## 2021-08-26 ENCOUNTER — Telehealth: Payer: Self-pay

## 2021-08-26 ENCOUNTER — Other Ambulatory Visit: Payer: Self-pay

## 2021-08-26 NOTE — Progress Notes (Signed)
g

## 2021-08-26 NOTE — Progress Notes (Signed)
f °

## 2021-08-26 NOTE — Telephone Encounter (Signed)
Patient notified of results, I schedule him on 08/31/21 at 11:20

## 2021-08-26 NOTE — Telephone Encounter (Signed)
-----   Message from Park Liter, MD sent at 08/26/2021 11:09 AM EDT ----- ?Stress test abnormal, he needs to have a follow-up appointment to discuss those findings ?

## 2021-08-31 ENCOUNTER — Encounter: Payer: Self-pay | Admitting: Cardiology

## 2021-08-31 ENCOUNTER — Ambulatory Visit: Payer: PPO | Admitting: Cardiology

## 2021-08-31 VITALS — BP 140/70 | HR 64 | Ht 74.0 in | Wt 242.0 lb

## 2021-08-31 DIAGNOSIS — R933 Abnormal findings on diagnostic imaging of other parts of digestive tract: Secondary | ICD-10-CM

## 2021-08-31 DIAGNOSIS — R6889 Other general symptoms and signs: Secondary | ICD-10-CM

## 2021-08-31 DIAGNOSIS — R55 Syncope and collapse: Secondary | ICD-10-CM

## 2021-08-31 DIAGNOSIS — K589 Irritable bowel syndrome without diarrhea: Secondary | ICD-10-CM

## 2021-08-31 DIAGNOSIS — R194 Change in bowel habit: Secondary | ICD-10-CM | POA: Insufficient documentation

## 2021-08-31 DIAGNOSIS — R0789 Other chest pain: Secondary | ICD-10-CM

## 2021-08-31 DIAGNOSIS — R195 Other fecal abnormalities: Secondary | ICD-10-CM | POA: Insufficient documentation

## 2021-08-31 DIAGNOSIS — K648 Other hemorrhoids: Secondary | ICD-10-CM | POA: Insufficient documentation

## 2021-08-31 DIAGNOSIS — E782 Mixed hyperlipidemia: Secondary | ICD-10-CM

## 2021-08-31 DIAGNOSIS — R1031 Right lower quadrant pain: Secondary | ICD-10-CM | POA: Insufficient documentation

## 2021-08-31 DIAGNOSIS — K627 Radiation proctitis: Secondary | ICD-10-CM

## 2021-08-31 DIAGNOSIS — I10 Essential (primary) hypertension: Secondary | ICD-10-CM

## 2021-08-31 HISTORY — DX: Abnormal findings on diagnostic imaging of other parts of digestive tract: R93.3

## 2021-08-31 HISTORY — DX: Other fecal abnormalities: R19.5

## 2021-08-31 HISTORY — DX: Other general symptoms and signs: R68.89

## 2021-08-31 HISTORY — DX: Radiation proctitis: K62.7

## 2021-08-31 HISTORY — DX: Right lower quadrant pain: R10.31

## 2021-08-31 HISTORY — DX: Other chest pain: R07.89

## 2021-08-31 HISTORY — DX: Change in bowel habit: R19.4

## 2021-08-31 HISTORY — DX: Irritable bowel syndrome, unspecified: K58.9

## 2021-08-31 HISTORY — DX: Other hemorrhoids: K64.8

## 2021-08-31 NOTE — Patient Instructions (Signed)
Medication Instructions:  ?Your physician recommends that you continue on your current medications as directed. Please refer to the Current Medication list given to you today. ? ?*If you need a refill on your cardiac medications before your next appointment, please call your pharmacy* ? ? ?Lab Work: ?None ?If you have labs (blood work) drawn today and your tests are completely normal, you will receive your results only by: ?MyChart Message (if you have MyChart) OR ?A paper copy in the mail ?If you have any lab test that is abnormal or we need to change your treatment, we will call you to review the results. ? ? ?Testing/Procedures: ? ?[facility] ?[place] ?[place] [address] ?Dept: [phone] ?Loc: [phone] ? ?Dean Guzman  08/31/2021 ? ?You are scheduled for a Cardiac Catheterization on Wednesday, April 12 with Dr. Sherren Mocha. ? ?1. Please arrive at the Main Entrance A at [facility]: [place], [place] [address] at 9:30 AM (This time is two hours before your procedure to ensure your preparation). Free valet parking service is available.  ? ?Special note: Every effort is made to have your procedure done on time. Please understand that emergencies sometimes delay scheduled procedures. ? ?2. Diet: Do not eat solid foods after midnight.  You may have clear liquids until 5 AM upon the day of the procedure. ? ?3. Labs: You will need to have blood drawn on Wednesday, April 5 at [employer]: [address], Technical sales engineer . You do not need to be fasting. ? ?4. Medication instructions in preparation for your procedure: ? ? Contrast Allergy: No ? ?On the morning of your procedure, take Aspirin and any morning medicines NOT listed above.  You may use sips of water. ? ?5. Plan to go home the same day, you will only stay overnight if medically necessary. ?6. You MUST have a responsible adult to drive you  home. ?7. An adult MUST be with you the first 24 hours after you arrive home. ?8. Bring a current list of your medications, and the last time and date medication taken. ?9. Bring ID and current insurance cards. ?10.Please wear clothes that are easy to get on and off and wear slip-on shoes. ? ?Thank you for allowing [place] to care for you! ?  -- [facility] ? ? ? ?Follow-Up: ?At [facility], you and your health needs are our priority.  As part of our continuing mission to provide you with exceptional heart care, we have created designated Provider Care Teams.  These Care Teams include your primary Cardiologist (physician) and Advanced Practice Providers (APPs -  Physician Assistants and Nurse Practitioners) who all work together to provide you with the care you need, when you need it. ? ?We recommend signing up for the patient portal called "MyChart".  Sign up information is provided on this After Visit Summary.  MyChart is used to connect with patients for Virtual Visits (Telemedicine).  Patients are able to view lab/test results, encounter notes, upcoming appointments, etc.  Non-urgent messages can be sent to your provider as well.   ?To learn more about what you can do with MyChart, go to [email or web address].   ? ?Your next appointment:   ?2 month(s) ? ?The format for your next appointment:   ?In Person ? ?Provider:   ?Jenne Campus, MD  ? ? ?Other Instructions ?None ? ?

## 2021-08-31 NOTE — Progress Notes (Signed)
?Cardiology Office Note:   ? ?Date:  08/31/2021  ? ?ID:  Dean Guzman, DOB 31-May-1950, MRN [id number] ? ?PCP:  Shelda Pal, DO  ?Cardiologist:  Jenne Campus, MD   ? ?Referring MD: Shelda Pal*  ? ?Chief Complaint  ?Patient presents with  ? Results  ?Abnormal stress test ? ?History of Present Illness:   ? ?Dean Guzman is a 71 y.o. male with past medical history significant for factor V Leiden mutation, dyslipidemia, essential hypertension, he was referred to [place] because of episode of dizziness and near syncope.  Monitor has been placed on him he was finding of nonsustained ventricular tachycardia but only 4 beats in the row.  As a part of evaluation echocardiogram has been performed which showed preserved left ventricle ejection fraction with no segmental wall motion abnormalities.  As a part of evaluation for ischemia stress test has been performed which showed old inferior wall infarct with peri-infarct ischemia.  He comes today to my office to discuss results of this test.  Overall he said majority of time he is doing well he did describe to have some chest pain but those chest pain happening usually at rest.  At the same time he is fairly active he is able to walk with 2 dogs we have no difficulty doing it.  Recently he put some mulch with no difficulties. ?Denies having any syncope recently and overall seems to be doing well. ? ?Past Medical History:  ?Diagnosis Date  ? Barrett's esophagus   ? BPH (benign prostatic hyperplasia)   ? Colon polyps   ? Complication of anesthesia   ? during biopsy, felt hot and sweating  ? FH: factor V Leiden mutation   ? niece died of leiden factor five complications, sister has  ? Frequent headaches   ? GERD (gastroesophageal reflux disease)   ? History of kidney stones   ? Hyperlipidemia   ? Hypertension   ? Migraines   ?  no recent migraines had visual aura  ? Prostate cancer ([place])   ? ? ?Past Surgical History:  ?Procedure Laterality Date  ?  colonscopy    ? CYSTOSCOPY N/A 01/13/2020  ? Procedure: CYSTOSCOPY FLEXIBLE;  Surgeon: Lucas Mallow, MD;  Location: [facility];  Service: Urology;  Laterality: N/A;  NO SEEDS FOUND IN BLADDER  ? MASS EXCISION Left 02/08/2021  ? Procedure: EXCISION MASS OF MUCOID CYST WITH DISTAL INTERPHANGEAL JOINT ARTHROTOMY LEFT MIDDLE FINGER;  Surgeon: [name], MD;  Location: [place];  Service: Orthopedics;  Laterality: Left;  ? NASAL ENDOSCOPY    ? PROSTATE BIOPSY  may 2021 and 2020  ? RADIOACTIVE SEED IMPLANT N/A 01/13/2020  ? Procedure: RADIOACTIVE SEED IMPLANT/BRACHYTHERAPY IMPLANT;  Surgeon: Lucas Mallow, MD;  Location: [facility];  Service: Urology;  Laterality: N/A;    73 SEEDS IMPLANTED  ? SPACE OAR INSTILLATION N/A 01/13/2020  ? Procedure: SPACE OAR INSTILLATION;  Surgeon: Lucas Mallow, MD;  Location: [facility];  Service: Urology;  Laterality: N/A;  ? ? ?Current Medications: ?Current Meds  ?Medication Sig  ? amLODipine (NORVASC) 5 MG tablet Take 1 tablet by mouth daily.  ? cholecalciferol (VITAMIN D3) 25 MCG (1000 UT) tablet Take 1,000 Units by mouth daily.  ? diltiazem (DILACOR XR) 240 MG 24 hr capsule Take 1 capsule by mouth daily. (Patient taking differently: Take 240 mg by mouth daily. Take 1 capsule by mouth daily.)  ? ipratropium (ATROVENT) 0.06 %  nasal spray Instill 2 sprays into each nostril three times daily as needed for rhinitis. (Patient taking differently: Place 2 sprays into both nostrils as needed for rhinitis.)  ? lisinopril-hydrochlorothiazide (ZESTORETIC) 10-12.5 MG tablet Take 1 tablet by mouth daily.  ? Omega-3 Fatty Acids (FISH OIL) 1200 MG CAPS Take 1 capsule by mouth 2 (two) times daily.  ? pantoprazole (PROTONIX) 40 MG tablet Take 40 mg by mouth 2 (two) times daily.  ? rosuvastatin (CRESTOR) 5 MG tablet Take 1 tablet (5 mg total) by mouth daily.  ? tadalafil (CIALIS) 5 MG tablet Take 5 mg by mouth daily as needed  for erectile dysfunction.  ?  ? ?Allergies:   Patient has no known allergies.  ? ?Social History  ? ?Socioeconomic History  ? Marital status: Married  ?  Spouse name: Not on file  ? Number of children: 3  ? Years of education: Not on file  ? Highest education level: Not on file  ?Occupational History  ?  Comment: full time  ?Tobacco Use  ? Smoking status: Former  ?  Packs/day: 1.00  ?  Years: 12.00  ?  Pack years: 12.00  ?  Types: Cigarettes  ?  Quit date: 05/30/1978  ?  Years since quitting: 43.2  ? Smokeless tobacco: Never  ?Vaping Use  ? Vaping Use: Never used  ?Substance and Sexual Activity  ? Alcohol use: Yes  ?  Comment: rarely  ? Drug use: Not Currently  ? Sexual activity: Yes  ?Other Topics Concern  ? Not on file  ?Social History Narrative  ? Not on file  ? ?Social Determinants of Health  ? ?Financial Resource Strain: Not on file  ?Food Insecurity: Not on file  ?Transportation Needs: Not on file  ?Physical Activity: Not on file  ?Stress: Not on file  ?Social Connections: Not on file  ?  ? ?Family History: ?The patient's family history includes Arthritis in [place] Roman[place]'s mother; Breast cancer in [place] Schueler's sister; Diabetes in [place] Ro[place]'s maternal grandmother; Early death in [place] Ammar's paternal grandfather and paternal grandmother; Hearing loss in [place]'s paternal grandfather and paternal grandmother; Heart disease in [place] Geesey's father; Hyperlipidemia in [place] Knerr's father; Hypertension in [place] Nagy's father. There is no history of Allergic rhinitis, Asthma, Eczema, Urticaria, Prostate cancer, Colon cancer, or Pancreatic cancer. ?ROS:   ?Please see the history of present illness.    ?All 14 point review of systems negative except as described per history of present illness ? ?EKGs/Labs/Other Studies Reviewed:   ? ?Stress test done in August 25, 2021 showed: ? ?  Findings are consistent with prior myocardial infarction with peri-infarct ischemia. The study is low  risk. ?  No ST deviation was noted. ?  LV perfusion is abnormal. Defect 1: There is a medium defect with moderate reduction in uptake present in the apical to basal inferior location(s) that is partially reversible. There is abnormal wall motion in the defect area. Consistent with infarction and peri-infarct ischemia. ?  Left ventricular function is abnormal. Global function is mildly reduced. Nuclear stress EF: 54 %. The left ventricular ejection fraction is mildly decreased (45-54%). End diastolic cavity size is normal. End systolic cavity size is normal. ?  Prior study not available for comparison. ?  ?Moderate size and intensity, partially reversible inferior perfusion defect (SDS 3), not improved with stress upright imaging, which is less suggestive of artifact. Favor scar with peri-infarct ischemia vs possible artifact. LVEF 54% with mild inferior hypokinesis. Echo correlation  is advised. This is a low risk study. No prior study for comparison. ?  ? ?Zio patch in August 12, 2021 showed: ?  ?Patch Wear Time:  14 days and 0 hours (2023-02-22T09:23:21-0500 to 2023-03-08T09:23:25-0500) ?  ?Patient had a min HR of 40 bpm, max HR of 174 bpm, and avg HR of 60 bpm. Predominant underlying rhythm was Sinus Rhythm. 1 run of Ventricular Tachycardia occurred lasting 4 beats with a max rate of 174 bpm (avg 158 bpm). 31 Supraventricular Tachycardia  ?runs occurred, the run with the fastest interval lasting 5 beats with a max rate of 146 bpm, the longest lasting 12.8 secs with an avg rate of 99 bpm. Idioventricular Rhythm was present. Isolated SVEs were occasional (1.6%, 18966), SVE Couplets were rare ? (<1.0%, 220), and SVE Triplets were rare (<1.0%, 38). Isolated VEs were rare (<1.0%), VE Couplets were rare (<1.0%), and no VE Triplets were present.  ?  ?Summary conclusions: ?1 run of ventricular tachycardia 4 beats at rate 174. ?31 episode of supraventricular tachycardia with the longest episodes of 12.8 seconds at rate of  99 bpm asymptomatic ? ?Echocardiogram done on July 23, 2031 showed: ?1. Left ventricular ejection fraction, by estimation, is 60 to 65%. The  ?left ventricle has normal function. The left ventricle has no region

## 2021-08-31 NOTE — H&P (View-Only) (Signed)
?Cardiology Office Note:   ? ?Date:  08/31/2021  ? ?ID:  Dean Guzman, DOB 1951-05-19, MRN [id number] ? ?PCP:  Shelda Pal, DO  ?Cardiologist:  Jenne Campus, MD   ? ?Referring MD: Shelda Pal*  ? ?Chief Complaint  ?Patient presents with  ? Results  ?Abnormal stress test ? ?History of Present Illness:   ? ?Dean Guzman is a 71 y.o. male with past medical history significant for factor V Leiden mutation, dyslipidemia, essential hypertension, he was referred to [place] because of episode of dizziness and near syncope.  Monitor has been placed on him he was finding of nonsustained ventricular tachycardia but only 4 beats in the row.  As a part of evaluation echocardiogram has been performed which showed preserved left ventricle ejection fraction with no segmental wall motion abnormalities.  As a part of evaluation for ischemia stress test has been performed which showed old inferior wall infarct with peri-infarct ischemia.  He comes today to my office to discuss results of this test.  Overall he said majority of time he is doing well he did describe to have some chest pain but those chest pain happening usually at rest.  At the same time he is fairly active he is able to walk with 2 dogs we have no difficulty doing it.  Recently he put some mulch with no difficulties. ?Denies having any syncope recently and overall seems to be doing well. ? ?Past Medical History:  ?Diagnosis Date  ? Barrett's esophagus   ? BPH (benign prostatic hyperplasia)   ? Colon polyps   ? Complication of anesthesia   ? during biopsy, felt hot and sweating  ? FH: factor V Leiden mutation   ? niece died of leiden factor five complications, sister has  ? Frequent headaches   ? GERD (gastroesophageal reflux disease)   ? History of kidney stones   ? Hyperlipidemia   ? Hypertension   ? Migraines   ?  no recent migraines had visual aura  ? Prostate cancer ([place])   ? ? ?Past Surgical History:  ?Procedure Laterality Date  ?  colonscopy    ? CYSTOSCOPY N/A 01/13/2020  ? Procedure: CYSTOSCOPY FLEXIBLE;  Surgeon: Lucas Mallow, MD;  [facility];  Service: Urology;  Laterality: N/A;  NO SEEDS FOUND IN BLADDER  ? MASS EXCISION Left 02/08/2021  ? Procedure: EXCISION MASS OF MUCOID CYST WITH DISTAL INTERPHANGEAL JOINT ARTHROTOMY LEFT MIDDLE FINGER;  Surgeon: [name], MD;  Location: [place];  Service: Orthopedics;  Laterality: Left;  ? NASAL ENDOSCOPY    ? PROSTATE BIOPSY  may 2021 and 2020  ? RADIOACTIVE SEED IMPLANT N/A 01/13/2020  ? Procedure: RADIOACTIVE SEED IMPLANT/BRACHYTHERAPY IMPLANT;  Surgeon: Lucas Mallow, MD;  Location[facility];  Service: Urology;  Laterality: N/A;    73 SEEDS IMPLANTED  ? SPACE OAR INSTILLATION N/A 01/13/2020  ? Procedure: SPACE OAR INSTILLATION;  Surgeon: Lucas Mallow, MD;  Location[facility];  Service: Urology;  Laterality: N/A;  ? ? ?Current Medications: ?Current Meds  ?Medication Sig  ? amLODipine (NORVASC) 5 MG tablet Take 1 tablet by mouth daily.  ? cholecalciferol (VITAMIN D3) 25 MCG (1000 UT) tablet Take 1,000 Units by mouth daily.  ? diltiazem (DILACOR XR) 240 MG 24 hr capsule Take 1 capsule by mouth daily. (Patient taking differently: Take 240 mg by mouth daily. Take 1 capsule by mouth daily.)  ? ipratropium (ATROVENT) 0.06 %  nasal spray Instill 2 sprays into each nostril three times daily as needed for rhinitis. (Patient taking differently: Place 2 sprays into both nostrils as needed for rhinitis.)  ? lisinopril-hydrochlorothiazide (ZESTORETIC) 10-12.5 MG tablet Take 1 tablet by mouth daily.  ? Omega-3 Fatty Acids (FISH OIL) 1200 MG CAPS Take 1 capsule by mouth 2 (two) times daily.  ? pantoprazole (PROTONIX) 40 MG tablet Take 40 mg by mouth 2 (two) times daily.  ? rosuvastatin (CRESTOR) 5 MG tablet Take 1 tablet (5 mg total) by mouth daily.  ? tadalafil (CIALIS) 5 MG tablet Take 5 mg by mouth daily as needed  for erectile dysfunction.  ?  ? ?Allergies:   Patient has no known allergies.  ? ?Social History  ? ?Socioeconomic History  ? Marital status: Married  ?  Spouse name: Not on file  ? Number of children: 3  ? Years of education: Not on file  ? Highest education level: Not on file  ?Occupational History  ?  Comment: full time  ?Tobacco Use  ? Smoking status: Former  ?  Packs/day: 1.00  ?  Years: 12.00  ?  Pack years: 12.00  ?  Types: Cigarettes  ?  Quit date: 05/30/1978  ?  Years since quitting: 43.2  ? Smokeless tobacco: Never  ?Vaping Use  ? Vaping Use: Never used  ?Substance and Sexual Activity  ? Alcohol use: Yes  ?  Comment: rarely  ? Drug use: Not Currently  ? Sexual activity: Yes  ?Other Topics Concern  ? Not on file  ?Social History Narrative  ? Not on file  ? ?Social Determinants of Health  ? ?Financial Resource Strain: Not on file  ?Food Insecurity: Not on file  ?Transportation Needs: Not on file  ?Physical Activity: Not on file  ?Stress: Not on file  ?Social Connections: Not on file  ?  ? ?Family History: ?The patient's family history includes Arthritis in [place] Guzman's mother; Breast cancer in Dean Guzman's sister; Diabetes in Dean Guzman's maternal grandmother; Early death in [place] Guzman's paternal grandfather and paternal grandmother; Hearing loss in Dean Guzman's paternal grandfather and paternal grandmother; Heart disease in Dean Guzman's father; Hyperlipidemia in Dean Guzman's father; Hypertension in Dean Guzman's father. There is no history of Allergic rhinitis, Asthma, Eczema, Urticaria, Prostate cancer, Colon cancer, or Pancreatic cancer. ?ROS:   ?Please see the history of present illness.    ?All 14 point review of systems negative except as described per history of present illness ? ?EKGs/Labs/Other Studies Reviewed:   ? ?Stress test done in August 25, 2021 showed: ? ?  Findings are consistent with prior myocardial infarction with peri-infarct ischemia. The study is low  risk. ?  No ST deviation was noted. ?  LV perfusion is abnormal. Defect 1: There is a medium defect with moderate reduction in uptake present in the apical to basal inferior location(s) that is partially reversible. There is abnormal wall motion in the defect area. Consistent with infarction and peri-infarct ischemia. ?  Left ventricular function is abnormal. Global function is mildly reduced. Nuclear stress EF: 54 %. The left ventricular ejection fraction is mildly decreased (45-54%). End diastolic cavity size is normal. End systolic cavity size is normal. ?  Prior study not available for comparison. ?  ?Moderate size and intensity, partially reversible inferior perfusion defect (SDS 3), not improved with stress upright imaging, which is less suggestive of artifact. Favor scar with peri-infarct ischemia vs possible artifact. LVEF 54% with mild inferior hypokinesis. Echo correlation  is advised. This is a low risk study. No prior study for comparison. ?  ? ?Zio patch in August 12, 2021 showed: ?  ?Patch Wear Time:  14 days and 0 hours (2023-02-22T09:23:21-0500 to 2023-03-08T09:23:25-0500) ?  ?Patient had a min HR of 40 bpm, max HR of 174 bpm, and avg HR of 60 bpm. Predominant underlying rhythm was Sinus Rhythm. 1 run of Ventricular Tachycardia occurred lasting 4 beats with a max rate of 174 bpm (avg 158 bpm). 31 Supraventricular Tachycardia  ?runs occurred, the run with the fastest interval lasting 5 beats with a max rate of 146 bpm, the longest lasting 12.8 secs with an avg rate of 99 bpm. Idioventricular Rhythm was present. Isolated SVEs were occasional (1.6%, 18966), SVE Couplets were rare ? (<1.0%, 220), and SVE Triplets were rare (<1.0%, 38). Isolated VEs were rare (<1.0%), VE Couplets were rare (<1.0%), and no VE Triplets were present.  ?  ?Summary conclusions: ?1 run of ventricular tachycardia 4 beats at rate 174. ?31 episode of supraventricular tachycardia with the longest episodes of 12.8 seconds at rate of  99 bpm asymptomatic ? ?Echocardiogram done on July 23, 2031 showed: ?1. Left ventricular ejection fraction, by estimation, is 60 to 65%. The  ?left ventricle has normal function. The left ventricle has no region

## 2021-09-02 DIAGNOSIS — R55 Syncope and collapse: Secondary | ICD-10-CM | POA: Diagnosis not present

## 2021-09-02 DIAGNOSIS — R0789 Other chest pain: Secondary | ICD-10-CM | POA: Diagnosis not present

## 2021-09-02 NOTE — Addendum Note (Signed)
Addended by: Truddie Hidden on: 09/02/2021 11:44 AM ? ? Modules accepted: Orders ? ?

## 2021-09-03 LAB — CBC WITH DIFFERENTIAL/PLATELET
Basophils Absolute: 0 10*3/uL (ref 0.0–0.2)
Basos: 1 %
EOS (ABSOLUTE): 0.1 10*3/uL (ref 0.0–0.4)
Eos: 3 %
Hematocrit: 45.1 % (ref 37.5–51.0)
Hemoglobin: 14.6 g/dL (ref 13.0–17.7)
Immature Grans (Abs): 0 10*3/uL (ref 0.0–0.1)
Immature Granulocytes: 0 %
Lymphocytes Absolute: 1.2 10*3/uL (ref 0.7–3.1)
Lymphs: 21 %
MCH: 28.9 pg (ref 26.6–33.0)
MCHC: 32.4 g/dL (ref 31.5–35.7)
MCV: 89 fL (ref 79–97)
Monocytes Absolute: 0.6 10*3/uL (ref 0.1–0.9)
Monocytes: 11 %
Neutrophils Absolute: 3.6 10*3/uL (ref 1.4–7.0)
Neutrophils: 64 %
Platelets: 208 10*3/uL (ref 150–450)
RBC: 5.05 x10E6/uL (ref 4.14–5.80)
RDW: 12.5 % (ref 11.6–15.4)
WBC: 5.5 10*3/uL (ref 3.4–10.8)

## 2021-09-03 LAB — BASIC METABOLIC PANEL
BUN/Creatinine Ratio: 18 (ref 10–24)
BUN: 21 mg/dL (ref 8–27)
CO2: 24 mmol/L (ref 20–29)
Calcium: 9.9 mg/dL (ref 8.6–10.2)
Chloride: 99 mmol/L (ref 96–106)
Creatinine, Ser: 1.17 mg/dL (ref 0.76–1.27)
Glucose: 96 mg/dL (ref 70–99)
Potassium: 4.9 mmol/L (ref 3.5–5.2)
Sodium: 140 mmol/L (ref 134–144)
eGFR: 67 mL/min/{1.73_m2} (ref >59)

## 2021-09-06 ENCOUNTER — Encounter: Payer: Self-pay | Admitting: Cardiology

## 2021-09-07 ENCOUNTER — Telehealth: Payer: Self-pay

## 2021-09-07 ENCOUNTER — Other Ambulatory Visit: Payer: Self-pay

## 2021-09-07 NOTE — Telephone Encounter (Signed)
Called patient to review his medications per the [employer] guidelines for his cardiac catheterization. Instructed patient to hold his lisinopril due to the HCTZ. Also instructed him to take a aspirin 81 mg the morning of the procedure prior to arrival regardless of his schedule. I also confirmed with the patient that he should arrive at [facility] at 9:30 since his cardiac catheterization is at 11:30. Patient had no further questions at this time. ?

## 2021-09-08 ENCOUNTER — Other Ambulatory Visit (HOSPITAL_COMMUNITY): Payer: Self-pay

## 2021-09-08 ENCOUNTER — Other Ambulatory Visit: Payer: Self-pay

## 2021-09-08 ENCOUNTER — Encounter (HOSPITAL_COMMUNITY)
Admission: RE | Disposition: A | Discharge status: Home / Self Care | Payer: Self-pay | Source: Home / Self Care | Attending: Cardiovascular Disease

## 2021-09-08 ENCOUNTER — Ambulatory Visit (HOSPITAL_COMMUNITY)
Admission: RE | Admit: 2021-09-08 | Discharge: 2021-09-08 | Disposition: A | Discharge status: Home / Self Care | Payer: PPO | Attending: Cardiovascular Disease | Admitting: Cardiovascular Disease

## 2021-09-08 DIAGNOSIS — Z87891 Personal history of nicotine dependence: Secondary | ICD-10-CM | POA: Diagnosis not present

## 2021-09-08 DIAGNOSIS — D6851 Activated protein C resistance: Secondary | ICD-10-CM | POA: Insufficient documentation

## 2021-09-08 DIAGNOSIS — E782 Mixed hyperlipidemia: Secondary | ICD-10-CM | POA: Insufficient documentation

## 2021-09-08 DIAGNOSIS — I10 Essential (primary) hypertension: Secondary | ICD-10-CM | POA: Insufficient documentation

## 2021-09-08 DIAGNOSIS — Z79899 Other long term (current) drug therapy: Secondary | ICD-10-CM | POA: Diagnosis not present

## 2021-09-08 DIAGNOSIS — Z955 Presence of coronary angioplasty implant and graft: Secondary | ICD-10-CM | POA: Insufficient documentation

## 2021-09-08 DIAGNOSIS — R55 Syncope and collapse: Secondary | ICD-10-CM | POA: Diagnosis not present

## 2021-09-08 DIAGNOSIS — E785 Hyperlipidemia, unspecified: Secondary | ICD-10-CM | POA: Diagnosis present

## 2021-09-08 DIAGNOSIS — I2510 Atherosclerotic heart disease of native coronary artery without angina pectoris: Secondary | ICD-10-CM

## 2021-09-08 DIAGNOSIS — I4729 Other ventricular tachycardia: Secondary | ICD-10-CM | POA: Diagnosis present

## 2021-09-08 DIAGNOSIS — I472 Ventricular tachycardia, unspecified: Secondary | ICD-10-CM

## 2021-09-08 DIAGNOSIS — R0789 Other chest pain: Secondary | ICD-10-CM | POA: Diagnosis not present

## 2021-09-08 DIAGNOSIS — R9439 Abnormal result of other cardiovascular function study: Secondary | ICD-10-CM | POA: Diagnosis present

## 2021-09-08 HISTORY — PX: CORONARY ANGIOGRAPHY: CATH118303

## 2021-09-08 HISTORY — PX: CORONARY PRESSURE/FFR STUDY: CATH118243

## 2021-09-08 HISTORY — DX: Ventricular tachycardia, unspecified: I47.20

## 2021-09-08 HISTORY — PX: CORONARY STENT INTERVENTION: CATH118234

## 2021-09-08 HISTORY — DX: Atherosclerotic heart disease of native coronary artery without angina pectoris: I25.10

## 2021-09-08 SURGERY — CORONARY ANGIOGRAPHY (CATH LAB)
Anesthesia: LOCAL

## 2021-09-08 MED ORDER — ASPIRIN 81 MG PO CHEW: 81.0000 mg | CHEWABLE_TABLET | ORAL | Status: DC

## 2021-09-08 MED ORDER — MIDAZOLAM HCL 2 MG/2ML IJ SOLN
INTRAMUSCULAR | Status: DC | PRN
  Administered 2021-09-08 (×2): 2 mg via INTRAVENOUS

## 2021-09-08 MED ORDER — SODIUM CHLORIDE 0.9 % WEIGHT BASED INFUSION: 1.0000 mL/kg/h | INTRAVENOUS | Status: DC

## 2021-09-08 MED ORDER — ACETAMINOPHEN 325 MG PO TABS: 650.0000 mg | ORAL_TABLET | ORAL | Status: DC | PRN

## 2021-09-08 MED ORDER — CLOPIDOGREL BISULFATE 300 MG PO TABS
ORAL_TABLET | ORAL | Status: AC
  Filled 2021-09-08: qty 2

## 2021-09-08 MED ORDER — CLOPIDOGREL BISULFATE 75 MG PO TABS
75.0000 mg | ORAL_TABLET | Freq: Every day | ORAL | 0 refills | Status: DC
  Filled 2021-09-08: qty 30, 30d supply, fill #0

## 2021-09-08 MED ORDER — SODIUM CHLORIDE 0.9% FLUSH: 3.0000 mL | Freq: Two times a day (BID) | INTRAVENOUS | Status: DC

## 2021-09-08 MED ORDER — ASPIRIN EC 81 MG PO TBEC: 81.0000 mg | DELAYED_RELEASE_TABLET | Freq: Every day | ORAL | 2 refills | Status: DC

## 2021-09-08 MED ORDER — HEPARIN (PORCINE) IN NACL 1000-0.9 UT/500ML-% IV SOLN
INTRAVENOUS | Status: DC | PRN
  Administered 2021-09-08 (×3): 500 mL

## 2021-09-08 MED ORDER — FENTANYL CITRATE (PF) 100 MCG/2ML IJ SOLN
INTRAMUSCULAR | Status: DC | PRN
Start: 2021-09-08 — End: 2021-09-08
  Administered 2021-09-08 (×2): 25 ug via INTRAVENOUS

## 2021-09-08 MED ORDER — VERAPAMIL HCL 2.5 MG/ML IV SOLN
INTRAVENOUS | Status: DC | PRN
  Administered 2021-09-08: 10 mL via INTRA_ARTERIAL

## 2021-09-08 MED ORDER — HEPARIN SODIUM (PORCINE) 1000 UNIT/ML IJ SOLN
INTRAMUSCULAR | Status: AC
  Filled 2021-09-08: qty 10

## 2021-09-08 MED ORDER — MIDAZOLAM HCL 2 MG/2ML IJ SOLN
INTRAMUSCULAR | Status: AC
  Filled 2021-09-08: qty 2

## 2021-09-08 MED ORDER — NITROGLYCERIN 1 MG/10 ML FOR IR/CATH LAB
INTRA_ARTERIAL | Status: DC | PRN
  Administered 2021-09-08 (×2): 150 ug via INTRACORONARY
  Administered 2021-09-08: 200 ug via INTRACORONARY

## 2021-09-08 MED ORDER — SODIUM CHLORIDE 0.9 % WEIGHT BASED INFUSION
3.0000 mL/kg/h | INTRAVENOUS | Status: AC
  Administered 2021-09-08: 3 mL/kg/h via INTRAVENOUS

## 2021-09-08 MED ORDER — LABETALOL HCL 5 MG/ML IV SOLN: 10.0000 mg | INTRAVENOUS | Status: DC | PRN

## 2021-09-08 MED ORDER — ASPIRIN EC 81 MG PO TBEC
81.0000 mg | DELAYED_RELEASE_TABLET | Freq: Every day | ORAL | 2 refills | Status: DC
  Filled 2021-09-08: qty 100, 100d supply, fill #0

## 2021-09-08 MED ORDER — CLOPIDOGREL BISULFATE 75 MG PO TABS: 75.0000 mg | ORAL_TABLET | Freq: Every day | ORAL | Status: DC

## 2021-09-08 MED ORDER — CLOPIDOGREL BISULFATE 75 MG PO TABS
75.0000 mg | ORAL_TABLET | Freq: Every day | ORAL | 3 refills | Status: DC
  Filled 2021-09-08: qty 30, 30d supply, fill #0

## 2021-09-08 MED ORDER — ASPIRIN 81 MG PO TBEC
81.0000 mg | DELAYED_RELEASE_TABLET | Freq: Every day | ORAL | 1 refills | Status: AC
  Filled 2021-09-08: qty 90, 90d supply, fill #0

## 2021-09-08 MED ORDER — CLOPIDOGREL BISULFATE 75 MG PO TABS: 75.0000 mg | ORAL_TABLET | Freq: Every day | ORAL | 0 refills | Status: DC

## 2021-09-08 MED ORDER — OXYCODONE HCL 5 MG PO TABS: 5.0000 mg | ORAL_TABLET | ORAL | Status: DC | PRN

## 2021-09-08 MED ORDER — LIDOCAINE HCL (PF) 1 % IJ SOLN
INTRAMUSCULAR | Status: DC | PRN
  Administered 2021-09-08: 2 mL

## 2021-09-08 MED ORDER — CLOPIDOGREL BISULFATE 300 MG PO TABS
ORAL_TABLET | ORAL | Status: DC | PRN
  Administered 2021-09-08: 600 mg via ORAL

## 2021-09-08 MED ORDER — HEPARIN SODIUM (PORCINE) 1000 UNIT/ML IJ SOLN
INTRAMUSCULAR | Status: DC | PRN
  Administered 2021-09-08 (×2): 5000 [IU] via INTRAVENOUS
  Administered 2021-09-08: 4000 [IU] via INTRAVENOUS

## 2021-09-08 MED ORDER — SODIUM CHLORIDE 0.9 % IV SOLN: 250.0000 mL | INTRAVENOUS | Status: DC | PRN

## 2021-09-08 MED ORDER — SODIUM CHLORIDE 0.9% FLUSH: 3.0000 mL | INTRAVENOUS | Status: DC | PRN

## 2021-09-08 MED ORDER — VERAPAMIL HCL 2.5 MG/ML IV SOLN
INTRAVENOUS | Status: AC
  Filled 2021-09-08: qty 2

## 2021-09-08 MED ORDER — NITROGLYCERIN 0.4 MG SL SUBL
0.4000 mg | SUBLINGUAL_TABLET | SUBLINGUAL | 2 refills | Status: DC | PRN
  Filled 2021-09-08: qty 25, 14d supply, fill #0

## 2021-09-08 MED ORDER — HYDRALAZINE HCL 20 MG/ML IJ SOLN: 10.0000 mg | INTRAMUSCULAR | Status: DC | PRN

## 2021-09-08 MED ORDER — ONDANSETRON HCL 4 MG/2ML IJ SOLN: 4.0000 mg | Freq: Four times a day (QID) | INTRAMUSCULAR | Status: DC | PRN

## 2021-09-08 MED ORDER — ROSUVASTATIN CALCIUM 20 MG PO TABS
20.0000 mg | ORAL_TABLET | Freq: Every day | ORAL | 2 refills | Status: DC
  Filled 2021-09-08: qty 30, 30d supply, fill #0

## 2021-09-08 MED ORDER — IOHEXOL 350 MG/ML SOLN
INTRAVENOUS | Status: DC | PRN
  Administered 2021-09-08: 140 mL

## 2021-09-08 MED ORDER — NITROGLYCERIN 1 MG/10 ML FOR IR/CATH LAB
INTRA_ARTERIAL | Status: AC
  Filled 2021-09-08: qty 10

## 2021-09-08 MED ORDER — LIDOCAINE HCL (PF) 1 % IJ SOLN
INTRAMUSCULAR | Status: AC
  Filled 2021-09-08: qty 30

## 2021-09-08 MED ORDER — FENTANYL CITRATE (PF) 100 MCG/2ML IJ SOLN
INTRAMUSCULAR | Status: AC
Start: 2021-09-08 — End: ?
  Filled 2021-09-08: qty 2

## 2021-09-08 MED ORDER — HEPARIN (PORCINE) IN NACL 1000-0.9 UT/500ML-% IV SOLN
INTRAVENOUS | Status: AC
  Filled 2021-09-08: qty 1500

## 2021-09-08 SURGICAL SUPPLY — 21 items
BALL SAPPHIRE NC24 3.25X22 (BALLOONS) ×2
BALLN SCOREFLEX 2.50X10 (BALLOONS) ×2
BALLOON SAPPHIRE NC24 3.25X22 (BALLOONS) IMPLANT
BALLOON SCOREFLEX 2.50X10 (BALLOONS) IMPLANT
CATH 5FR JR4 DIAGNOSTIC (CATHETERS) ×1 IMPLANT
CATH JL3.5 FR DIAG (CATHETERS) ×1 IMPLANT
CATH VISTA GUIDE 6FR XBLAD3.5 (CATHETERS) ×1 IMPLANT
DEVICE RAD COMP TR BAND LRG (VASCULAR PRODUCTS) ×1 IMPLANT
ELECT DEFIB PAD ADLT CADENCE (PAD) ×1 IMPLANT
GLIDESHEATH SLEND SS 6F .021 (SHEATH) ×1 IMPLANT
GUIDEWIRE INQWIRE 1.5J.035X260 (WIRE) IMPLANT
GUIDEWIRE PRESSURE X 175 (WIRE) ×1 IMPLANT
INQWIRE 1.5J .035X260CM (WIRE) ×2
KIT ENCORE 26 ADVANTAGE (KITS) ×1 IMPLANT
KIT HEART LEFT (KITS) ×4 IMPLANT
KIT HEMO VALVE WATCHDOG (MISCELLANEOUS) ×1 IMPLANT
PACK CARDIAC CATHETERIZATION (CUSTOM PROCEDURE TRAY) ×4 IMPLANT
STENT ONYX FRONTIER 3.0X30 (Permanent Stent) ×1 IMPLANT
SYR MEDRAD MARK 7 150ML (SYRINGE) ×4 IMPLANT
TRANSDUCER W/STOPCOCK (MISCELLANEOUS) ×4 IMPLANT
TUBING CIL FLEX 10 FLL-RA (TUBING) ×4 IMPLANT

## 2021-09-08 NOTE — Interval H&P Note (Signed)
History and Physical Interval Note: ? ?09/08/2021 ?11:01 AM ? ?Dean Guzman  has presented today for surgery, with the diagnosis of Coronary Artery Disease.  The various methods of treatment have been discussed with the patient and family. After consideration of risks, benefits and other options for treatment, the patient has consented to  Procedure(s): ?LEFT HEART CATH AND CORONARY ANGIOGRAPHY (N/A) as a surgical intervention.  The patient's history has been reviewed, patient examined, no change in status, stable for surgery.  I have reviewed the patient's chart and labs.  Questions were answered to the patient's satisfaction.   ? ? ?Sherren Mocha ? ? ?

## 2021-09-08 NOTE — Progress Notes (Signed)
Discussed with pt stent, restrictions, Plavix, diet, exercise, NTG and CRPII. Pt receptive. Will refer to [facility].  ?[id number] ?Yves Dill CES, ACSM ?3:04 PM ?09/08/2021 ? ?

## 2021-09-08 NOTE — Discharge Instructions (Signed)

## 2021-09-08 NOTE — Discharge Summary (Addendum)
?Discharge Summary for Same Day PCI  ? ?Patient ID: Dean Guzman ?MRN: [id number]; DOB: [birth year] ? ?Admit date: 09/08/2021 ?Discharge date: 09/08/2021 ? ?Primary Care Provider: Shelda Pal, DO  ?Primary Cardiologist:   Jenne Campus, MD   ?Primary Electrophysiologist:  None  ? ?Discharge Diagnoses  ?  ?Principal Problem: ?  CAD (coronary artery disease) ?Active Problems: ?  Essential hypertension ?  Hyperlipidemia ?  Paroxysmal ventricular tachycardia ([place]) ? ? ? ?Diagnostic Studies/Procedures  ?  ?Cardiac Catheterization 09/08/2021: ? ?  Prox RCA lesion is 40% stenosed. ?  1st Mrg lesion is 40% stenosed. ?  Mid LAD lesion is 80% stenosed. ?  Dist LAD lesion is 90% stenosed. ?  A drug-eluting stent was successfully placed using a STENT ONYX FRONTIER 3.0X30. ?  Post intervention, there is a 0% residual stenosis. ?  ?1.  Severe proximal to mid LAD calcific stenosis hemodynamic significance confirmed by pressure wire analysis, treated successfully with intracoronary stenting using a single 3.0 x 30 mm Onyx frontier DES ?2.  Mild diffuse nonobstructive plaquing involving the left circumflex and RCA with no high-grade stenoses in those vessels. ?3.  Residual severe stenosis in the apical portion of the LAD appropriate for medical therapy ?  ?Recommendations: Same-day discharge protocol if criteria met.  Dual antiplatelet therapy with aspirin and clopidogrel x6 months without interruption. ? ?Diagnostic ?Dominance: Right ?Intervention ? ? ? Echo from 07/22/21: ? ? 1. Left ventricular ejection fraction, by estimation, is 60 to 65%. The  ?left ventricle has normal function. The left ventricle has no regional  ?wall motion abnormalities. Left ventricular diastolic parameters were  ?normal.  ? 2. Right ventricular systolic function is normal. The right ventricular  ?size is normal.  ? 3. Left atrial size was mildly dilated.  ? 4. The mitral valve is normal in structure. Mild mitral valve  ?regurgitation. No  evidence of mitral stenosis.  ? 5. The aortic valve is normal in structure. Aortic valve regurgitation is  ?not visualized. No aortic stenosis is present.  ? 6. The inferior vena cava is normal in size with greater than 50%  ?respiratory variability, suggesting right atrial pressure of 3 mmHg.  ? ?_____________ ?  ?History of Present Illness   ?  ?Per H&P on 08/31/21 by Dr Agustin Cree: ? ?"Dean Guzman is a 72 y.o. male with past medical history significant for factor V Leiden mutation, dyslipidemia, essential hypertension, he was referred to cardiology because of episode of dizziness and near syncope.  Monitor has been placed on him with finding of nonsustained ventricular tachycardia but only 4 beats in the row.  As a part of evaluation echocardiogram has been performed which showed preserved left ventricle ejection fraction with no segmental wall motion abnormalities.  As a part of evaluation for ischemia stress test has been performed which showed old inferior wall infarct with peri-infarct ischemia.  He came to the office on 08/31/21 to discuss results of these tests.  Overall he said majority of time he is doing well he did describe to have some chest pain but those chest pain happening usually at rest.  At the same time he was fairly active he was able to walk with 2 dogs we have no difficulty doing it.  Recently he put some mulch with no difficulties. Denied having any syncope recently and overall seems to be doing well." ? ?Cardiac catheterization was arranged for further evaluation on 09/08/21.  ? ?Hospital Course  ?   ?The patient underwent cardiac cath  as noted above with Dr Burt Knack. Plan for DAPT with ASA 60m/Plavix 765mfor at least 6 months. The patient was seen by cardiac rehab while in short stay. There were no observed complications post cath. Radial cath site was re-evaluated prior to discharge and found to be stable without any complications. Instructions/precautions regarding cath site care were  given prior to discharge. ? ?TeJarry Manonas seen by Dr. CoBurt Knacknd determined stable for discharge home. Follow up with our office has been arranged. Medications are listed below. Changes: Start ASA 8155mnd Plavix 57m6mily for at least 6 months, start Crestor 20mg30mly, PRN SL Nitro for chest pain (do not use with sildenafil and tadalafil). No change on the rest of home meds.  ? ?_____________ ? ?Cath/PCI Registry Performance & Quality Measures: ?Aspirin prescribed? - Yes ?ADP Receptor Inhibitor (Plavix/Clopidogrel, Brilinta/Ticagrelor or Effient/Prasugrel) prescribed (includes medically managed patients)? - Yes ?High Intensity Statin (Lipitor 40-80mg 27mrestor 20-40mg) 58mcribed? - Yes ?For EF <40%, was ACEI/ARB prescribed? - Yes ?For EF <40%, Aldosterone Antagonist (Spironolactone or Eplerenone) prescribed? - Not Applicable (EF >/= 40%) ?C78%iac Rehab Phase II ordered (Included Medically managed Patients)? - Yes ? ?_____________ ? ? ?Discharge Vitals ?Blood pressure 98/63, pulse (!) 54, temperature 98.2 ?F (36.8 ?C), temperature source Oral, resp. rate 16, height '6\' 2"'  (1.88 m), weight 105.2 kg, SpO2 97 %.  ?Filed Danley Dankers  ? 09/08/21 0945  ?Weight: 105.2 kg  ? ?Vitals:  ?Vitals:  ? 09/08/21 1315 09/08/21 1330  ?BP: 103/65 98/63  ?Pulse: (!) 57 (!) 54  ?Resp: 18 16  ?Temp:    ?SpO2: 97% 97%  ? ? ?General Appearance: In no apparent distress,sitting in recliner ?Cardiovascular: Regular rate and rhythm, normal S1-S2,  no murmur/rub/gallop ?Respiratory: Resting breathing unlabored, lungs sounds clear to auscultation bilaterally, no use of accessory muscles. On room air.   ?Extremities: Right radial site without hematoma, bleeding, pulse 2+, no neurovascular deficit   ?Skin: Intact, warm, dry. ?Neurologic: Alert, oriented to person, place and time. No cognitive deficit ?Psychiatric: Normal affect. Mood is appropriate.  ? ? ?Last Labs & Radiologic Studies  ?  ?CBC ?No results for input(s): WBC, NEUTROABS, HGB,  HCT, MCV, PLT in the last 72 hours. ?Basic Metabolic Panel ?No results for input(s): NA, K, CL, CO2, GLUCOSE, BUN, CREATININE, CALCIUM, MG, PHOS in the last 72 hours. ?Liver Function Tests ?No results for input(s): AST, ALT, ALKPHOS, BILITOT, PROT, ALBUMIN in the last 72 hours. ?No results for input(s): LIPASE, AMYLASE in the last 72 hours. ?High Sensitivity Troponin:   ?No results for input(s): TROPONINIHS in the last 720 hours.  ?BNP ?Invalid input(s): POCBNP ?D-Dimer ?No results for input(s): DDIMER in the last 72 hours. ?Hemoglobin A1C ?No results for input(s): HGBA1C in the last 72 hours. ?Fasting Lipid Panel ?No results for input(s): CHOL, HDL, LDLCALC, TRIG, CHOLHDL, LDLDIRECT in the last 72 hours. ?Thyroid Function Tests ?No results for input(s): TSH, T4TOTAL, T3FREE, THYROIDAB in the last 72 hours. ? ?Invalid input(s): FREET3 ?_____________  ?CARDIAC CATHETERIZATION ? ?Result Date: 09/08/2021 ?  Prox RCA lesion is 40% stenosed.   1st Mrg lesion is 40% stenosed.   Mid LAD lesion is 80% stenosed.   Dist LAD lesion is 90% stenosed.   A drug-eluting stent was successfully placed using a STENT ONYX FRONTIER 3.0X30.   Post intervention, there is a 0% residual stenosis. 1.  Severe proximal to mid LAD calcific stenosis hemodynamic significance confirmed by pressure wire analysis, treated successfully with intracoronary stenting using  a single 3.0 x 30 mm Onyx frontier DES 2.  Mild diffuse nonobstructive plaquing involving the left circumflex and RCA with no high-grade stenoses in those vessels. 3.  Residual severe stenosis in the apical portion of the LAD appropriate for medical therapy Recommendations: Same-day discharge protocol if criteria met.  Dual antiplatelet therapy with aspirin and clopidogrel x6 months without interruption.  ? ?MYOCARDIAL PERFUSION IMAGING ? ?Result Date: 08/25/2021 ?  Findings are consistent with prior myocardial infarction with peri-infarct ischemia. The study is low risk.   No ST  deviation was noted.   LV perfusion is abnormal. Defect 1: There is a medium defect with moderate reduction in uptake present in the apical to basal inferior location(s) that is partially reversible. There is abnorm

## 2021-09-09 ENCOUNTER — Encounter (HOSPITAL_COMMUNITY): Payer: Self-pay | Admitting: Cardiovascular Disease

## 2021-09-09 LAB — POCT ACTIVATED CLOTTING TIME
Activated Clotting Time: 245 seconds
Activated Clotting Time: 281 seconds

## 2021-09-10 ENCOUNTER — Telehealth (HOSPITAL_COMMUNITY): Payer: Self-pay

## 2021-09-10 NOTE — Telephone Encounter (Signed)
Pt is not interested in participating in the cardiac rehab program. ?Closed referral. ?

## 2021-09-13 ENCOUNTER — Encounter (HOSPITAL_COMMUNITY): Payer: Self-pay | Admitting: Cardiovascular Disease

## 2021-09-14 ENCOUNTER — Telehealth (HOSPITAL_COMMUNITY): Payer: Self-pay

## 2021-09-14 ENCOUNTER — Ambulatory Visit (INDEPENDENT_AMBULATORY_CARE_PROVIDER_SITE_OTHER): Payer: PPO | Admitting: Family Medicine

## 2021-09-14 ENCOUNTER — Other Ambulatory Visit (HOSPITAL_COMMUNITY): Payer: Self-pay

## 2021-09-14 ENCOUNTER — Encounter: Payer: Self-pay | Admitting: Family Medicine

## 2021-09-14 VITALS — BP 120/60 | HR 74 | Temp 97.6°F | Resp 16 | Ht 74.0 in | Wt 240.0 lb

## 2021-09-14 DIAGNOSIS — R21 Rash and other nonspecific skin eruption: Secondary | ICD-10-CM | POA: Diagnosis not present

## 2021-09-14 DIAGNOSIS — J069 Acute upper respiratory infection, unspecified: Secondary | ICD-10-CM

## 2021-09-14 MED ORDER — TRIAMCINOLONE ACETONIDE 0.1 % EX CREA: 1.0000 "application " | TOPICAL_CREAM | Freq: Two times a day (BID) | CUTANEOUS | 0 refills | Status: DC

## 2021-09-14 NOTE — Patient Instructions (Signed)
Come in 2 weeks from now if no better with the skin. ? ?Send me a message if the coughing gets worse.  ? ?Let us know if you need anything. ?

## 2021-09-14 NOTE — Telephone Encounter (Signed)
Pharmacy Transitions of Care Follow-up Telephone Call ? ?Date of discharge: 09/08/2021  ?Discharge Diagnosis: CAD ? ?How have you been since you were released from the hospital? Patient feels fine since discharge. He has no complaints. ? ?Medication changes made at discharge: ?START taking: ?Aspirin Low Dose (aspirin)  ?clopidogrel (Plavix)  ?nitroGLYCERIN (Nitrostat)  ?Icon medications to change how you take   CHANGE how you take: ?rosuvastatin (Crestor)  ?Icon medications to stop taking   STOP taking: ?ibuprofen 200 MG tablet (ADVIL)  ?sildenafil 20 MG tablet (REVATIO)  ?tadalafil 5 MG tablet (CIALIS) ? ?Medication changes verified by the patient? Yes ?  ? ?Medication Accessibility: ? ?Home Pharmacy: [facility]  ? ?Was the patient provided with refills on discharged medications? Yes  ? ?Have all prescriptions been transferred from [facility] to home pharmacy? Yes  ? ?Is the patient able to afford medications? Yes ?  ? ?Medication Review: ?CLOPIDOGREL (PLAVIX) ?Clopidogrel 75 mg once daily.  ?- Educated patient on expected duration of therapy of 6 months with DAPT therapy ?- Reviewed potential DDIs with patient  ?- Advised patient of medications to avoid (NSAIDs, ASA)  ?- Educated that Tylenol (acetaminophen) will be the preferred analgesic to prevent risk of bleeding  ?- Emphasized importance of monitoring for signs and symptoms of bleeding (abnormal bruising, prolonged bleeding, nose bleeds, bleeding from gums, discolored urine, black tarry stools)  ?- Advised patient to alert all providers of anticoagulation therapy prior to starting a new medication or having a procedure  ? ?Follow-up Appointments: ? ?[facility] f/u appt confirmed? [facility] Scheduled to see Agustin Cree on 11/11/2021. ? ?If their condition worsens, is the pt aware to call PCP or go to the Emergency Dept.? Yes ? ?Final Patient Assessment: ?-Pt is doing well.  ?-Pt verbalized understanding of Plavix.  ?-Declined patient  education ?-Pt has post discharge appointment and refill sent to [facility].  ? ?

## 2021-09-14 NOTE — Progress Notes (Signed)
Chief Complaint  ?Patient presents with  ? Follow-up  ?  Here for ,Mucus coughing  and  white spots on arms  ? Cough  ? ? ?Dean Guzman here for URI complaints. ? ?Duration: 2 weeks; getting better over the past few days ?Associated symptoms: productive cough (chest will sometimes rattle) ?Denies: sinus congestion, sinus pain, rhinorrhea, itchy watery eyes, ear pain, ear drainage, sore throat, wheezing, shortness of breath, myalgia, and fevers ?Treatment to date: none ?Sick contacts: No ? ?Itchy patches of skin on L forearm for 1 mo. No pain or drainage. No new topicals. Has not tried anything. Does not bleed or scab.  ? ?Past Medical History:  ?Diagnosis Date  ? Barrett's esophagus   ? BPH (benign prostatic hyperplasia)   ? Colon polyps   ? Complication of anesthesia   ? during biopsy, felt hot and sweating  ? FH: factor V Leiden mutation   ? niece died of leiden factor five complications, sister has  ? Frequent headaches   ? GERD (gastroesophageal reflux disease)   ? History of kidney stones   ? Hyperlipidemia   ? Hypertension   ? Migraines   ?  no recent migraines had visual aura  ? Prostate cancer [facility])   ? ? ?Objective ?BP 120/60 (BP Location: Right Arm, Patient Position: Sitting, Cuff Size: Normal)   Pulse 74   Temp 97.6 ?F (36.4 ?C) (Oral)   Resp 16   Ht '6\' 2"'$  (1.88 m)   Wt 240 lb (108.9 kg)   SpO2 98%   BMI 30.81 kg/m?  ?General: Awake, alert, appears stated age ?HEENT: AT, Ruth, ears patent b/l and TM's neg, nares patent w/o discharge, pharynx pink and without exudates, MMM ?Neck: No masses or asymmetry ?Heart: RRR ?Lungs: CTAB, no accessory muscle use ?Skin: scaly patches measuring 0.6 cm x 0.4 cm; 0.4 x 0.3 cm on prox posterior L forearm. No fluctuance, drainage, ttp, excoriation, erythema, excessive warmth ?Psych: Age appropriate judgment and insight, normal mood and affect ? ?Viral URI with cough ? ?Scaly patch rash - Plan: triamcinolone cream (KENALOG) 0.1 % ? ?Improving, cont supportive care.  Continue to push fluids, practice good hand hygiene, cover mouth when coughing. Send message if no continued improvement.  ?Trial Kenalog bid for 7-10 d. F./u in 2 weeks if no better, will do shave biopsy.  ?Fu as originally scheduled otherwise.  ?Pt voiced understanding and agreement to the plan. ? ?Shelda Pal, DO ?09/14/21 ?2:48 PM ? ?

## 2021-09-16 ENCOUNTER — Ambulatory Visit: Payer: PPO | Admitting: Cardiology

## 2021-09-16 ENCOUNTER — Encounter: Payer: Self-pay | Admitting: Cardiology

## 2021-09-16 VITALS — BP 118/70 | HR 60 | Ht 74.0 in | Wt 238.4 lb

## 2021-09-16 DIAGNOSIS — T81718A Complication of other artery following a procedure, not elsewhere classified, initial encounter: Secondary | ICD-10-CM | POA: Diagnosis not present

## 2021-09-16 DIAGNOSIS — I10 Essential (primary) hypertension: Secondary | ICD-10-CM

## 2021-09-16 DIAGNOSIS — I2510 Atherosclerotic heart disease of native coronary artery without angina pectoris: Secondary | ICD-10-CM

## 2021-09-16 DIAGNOSIS — E782 Mixed hyperlipidemia: Secondary | ICD-10-CM | POA: Diagnosis not present

## 2021-09-16 DIAGNOSIS — R55 Syncope and collapse: Secondary | ICD-10-CM

## 2021-09-16 DIAGNOSIS — I729 Aneurysm of unspecified site: Secondary | ICD-10-CM | POA: Diagnosis not present

## 2021-09-16 DIAGNOSIS — I4729 Other ventricular tachycardia: Secondary | ICD-10-CM

## 2021-09-16 NOTE — Progress Notes (Signed)
?Cardiology Office Note:   ? ?Date:  09/16/2021  ? ?ID:  Dean Guzman, DOB 10-29-1950, MRN [id number] ? ?PCP:  Shelda Pal, DO  ?Cardiologist:  Jenne Campus, MD   ? ?Referring MD: Shelda Pal*  ? ?Chief Complaint  ?Patient presents with  ? Medication Refill  ?  Rosuvastatin 90 days   ? stent rplacement questions   ? ? ?History of Present Illness:   ? ?Dean Guzman is a 71 y.o. male with past medical history significant for factor V Leiden mutation, dyslipidemia, essential hypertension, initially he was referred to [place] because of episode of dizziness and near syncope, monitor was placed showed some ventricular tachycardia after that echocardiogram performed showing preserved ejection fraction, as a part of stratification of this arrhythmia he had stress test done which surprisingly showed old myocardial infarction with peri-infarct ischemia involving inferior wall.  After the cardiac catheterization was done and was find to have mid LAD lesion which was hemodynamically significant that was addressed with drug-eluting stent.  He comes today 2 months for follow-up overall he is doing fine.  He denies have any chest pain tightness squeezing pressure burning chest.  He is tolerating medications well ? ?Past Medical History:  ?Diagnosis Date  ? Barrett's esophagus   ? BPH (benign prostatic hyperplasia)   ? Colon polyps   ? Complication of anesthesia   ? during biopsy, felt hot and sweating  ? FH: factor V Leiden mutation   ? niece died of leiden factor five complications, sister has  ? Frequent headaches   ? GERD (gastroesophageal reflux disease)   ? History of kidney stones   ? Hyperlipidemia   ? Hypertension   ? Migraines   ?  no recent migraines had visual aura  ? Prostate cancer ([place])   ? ? ?Past Surgical History:  ?Procedure Laterality Date  ? colonscopy    ? CORONARY ANGIOGRAPHY N/A 09/08/2021  ? Procedure: CORONARY ANGIOGRAPHY;  Surgeon: Sherren Mocha, MD;  Location: [facility];  Service: Cardiovascular;  Laterality: N/A;  ? CORONARY STENT INTERVENTION N/A 09/08/2021  ? Procedure: CORONARY STENT INTERVENTION;  Surgeon: Sherren Mocha, MD;  Location: [facility];  Service: Cardiovascular;  Laterality: N/A;  ? CYSTOSCOPY N/A 01/13/2020  ? Procedure: CYSTOSCOPY FLEXIBLE;  Surgeon: Lucas Mallow, MD;  Location: [facility];  Service: Urology;  Laterality: N/A;  NO SEEDS FOUND IN BLADDER  ? INTRAVASCULAR PRESSURE WIRE/FFR STUDY N/A 09/08/2021  ? Procedure: INTRAVASCULAR PRESSURE WIRE/FFR STUDY;  Surgeon: Sherren Mocha, MD;  Location: [facility];  Service: Cardiovascular;  Laterality: N/A;  ? MASS EXCISION Left 02/08/2021  ? Procedure: EXCISION MASS OF MUCOID CYST WITH DISTAL INTERPHANGEAL JOINT ARTHROTOMY LEFT MIDDLE FINGER;  Surgeon: [name], MD;  Location: [facility];  Service: Orthopedics;  Laterality: Left;  ? NASAL ENDOSCOPY    ? PROSTATE BIOPSY  may 2021 and 2020  ? RADIOACTIVE SEED IMPLANT N/A 01/13/2020  ? Procedure: RADIOACTIVE SEED IMPLANT/BRACHYTHERAPY IMPLANT;  Surgeon: Lucas Mallow, MD;  Location[facility];  Service: Urology;  Laterality: N/A;    73 SEEDS IMPLANTED  ? SPACE OAR INSTILLATION N/A 01/13/2020  ? Procedure: SPACE OAR INSTILLATION;  Surgeon: Lucas Mallow, MD;  Location: [facility];  Service: Urology;  Laterality: N/A;  ? ? ?Current Medications: ?Current Meds  ?Medication Sig  ? amLODipine (NORVASC) 5 MG tablet Take 1 tablet by mouth daily. (Patient taking differently:  Take 5 mg by mouth daily.)  ? aspirin 81 MG EC tablet Take 1 tablet (81 mg total) by mouth daily.  ? carboxymethylcellulose (REFRESH PLUS) 0.5 % SOLN Place 1 drop into both eyes 3 (three) times daily as needed (dry eyes).  ? cholecalciferol (VITAMIN D3) 25 MCG (1000 UT) tablet Take 1,000 Units by mouth daily.  ? clopidogrel (PLAVIX) 75 MG tablet Take 1 tablet (75 mg total) by mouth daily.  ? diltiazem  (DILACOR XR) 240 MG 24 hr capsule Take 1 capsule by mouth daily. (Patient taking differently: Take 240 mg by mouth daily. Take 1 capsule by mouth daily.)  ? ipratropium (ATROVENT) 0.06 % nasal spray Instill 2 sprays into each nostril three times daily as needed for rhinitis. (Patient taking differently: Place 2 sprays into both nostrils as needed for rhinitis.)  ? lisinopril-hydrochlorothiazide (ZESTORETIC) 10-12.5 MG tablet Take 1 tablet by mouth daily.  ? nitroGLYCERIN (NITROSTAT) 0.4 MG SL tablet Place 1 tablet (0.4 mg total) under the tongue every 5 (five) minutes as needed for chest pain. Do not use with tadalafil and sildenafil  ? Omega-3 Fatty Acids (FISH OIL) 1200 MG CAPS Take 1,200 mg by mouth 2 (two) times daily.  ? pantoprazole (PROTONIX) 40 MG tablet Take 40 mg by mouth daily.  ? rosuvastatin (CRESTOR) 20 MG tablet Take 1 tablet (20 mg total) by mouth daily.  ? triamcinolone cream (KENALOG) 0.1 % Apply 1 application. topically 2 (two) times daily.  ?  ? ?Allergies:   Patient has no known allergies.  ? ?Social History  ? ?Socioeconomic History  ? Marital status: Married  ?  Spouse name: Not on file  ? Number of children: 3  ? Years of education: Not on file  ? Highest education level: Not on file  ?Occupational History  ?  Comment: full time  ?Tobacco Use  ? Smoking status: Former  ?  Packs/day: 1.00  ?  Years: 12.00  ?  Pack years: 12.00  ?  Types: Cigarettes  ?  Quit date: 05/30/1978  ?  Years since quitting: 43.3  ? Smokeless tobacco: Never  ?Vaping Use  ? Vaping Use: Never used  ?Substance and Sexual Activity  ? Alcohol use: Yes  ?  Comment: rarely  ? Drug use: Not Currently  ? Sexual activity: Yes  ?Other Topics Concern  ? Not on file  ?Social History Narrative  ? Not on file  ? ?Social Determinants of Health  ? ?Financial Resource Strain: Not on file  ?Food Insecurity: Not on file  ?Transportation Needs: Not on file  ?Physical Activity: Not on file  ?Stress: Not on file  ?Social Connections: Not on  file  ?  ? ?Family History: ?The patient's family history includes Arthritis in [place] Marling's mother; Breast cancer in [place] Blower's sister; Diabetes in [place] Godlewski's maternal grandmother; Early death in [place]'s paternal grandfather and paternal grandmother; Hearing loss in [place]'s paternal grandfather and paternal grandmother; Heart disease in [place] Radcliffe's father; Hyperlipidemia in [place] Mcchristian's father; Hypertension in [place] Trefz's father. There is no history of Allergic rhinitis, Asthma, Eczema, Urticaria, Prostate cancer, Colon cancer, or Pancreatic cancer. ?ROS:   ?Please see the history of present illness.    ?All 14 point review of systems negative except as described per history of present illness ? ?EKGs/Labs/Other Studies Reviewed:   ? ? ? ?Recent Labs: ?06/29/2021: ALT 22; TSH 1.18 ?09/02/2021: BUN 21; Creatinine, Ser 1.17; Hemoglobin 14.6; Platelets 208; Potassium 4.9; Sodium 140  ?Recent  Lipid Panel ?   ?Component Value Date/Time  ? CHOL 177 06/29/2021 1107  ? TRIG 110.0 06/29/2021 1107  ? HDL 42.20 06/29/2021 1107  ? CHOLHDL 4 06/29/2021 1107  ? VLDL 22.0 06/29/2021 1107  ? LDLCALC 112 (H) 06/29/2021 1107  ? ? ?Physical Exam:   ? ?VS:  BP 118/70 (BP Location: Left Arm, Patient Position: Sitting)   Pulse 60   Ht '6\' 2"'$  (1.88 m)   Wt 238 lb 6.4 oz (108.1 kg)   SpO2 95%   BMI 30.61 kg/m?    ? ?Wt Readings from Last 3 Encounters:  ?09/16/21 238 lb 6.4 oz (108.1 kg)  ?09/14/21 240 lb (108.9 kg)  ?09/08/21 232 lb (105.2 kg)  ?  ? ?GEN:  Well nourished, well developed in no acute distress ?HEENT: Normal ?NECK: No JVD; No carotid bruits ?LYMPHATICS: No lymphadenopathy ?CARDIAC: RRR, no murmurs, no rubs, no gallops ?RESPIRATORY:  Clear to auscultation without rales, wheezing or rhonchi  ?ABDOMEN: Soft, non-tender, non-distended ?MUSCULOSKELETAL:  No edema; No deformity  ?SKIN: Warm and dry ?LOWER EXTREMITIES: no swelling ?NEUROLOGIC:  Alert and oriented x 3 ?PSYCHIATRIC:   Normal affect  ? ?ASSESSMENT:   ? ?1. Coronary artery disease involving native coronary artery of native heart without angina pectoris   ?2. Essential hypertension   ?3. Near syncope   ?4. Paroxysmal ventricular tach

## 2021-09-16 NOTE — Patient Instructions (Signed)
Medication Instructions:  ?Your physician recommends that you continue on your current medications as directed. Please refer to the Current Medication list given to you today.  ?*If you need a refill on your cardiac medications before your next appointment, please call your pharmacy* ? ? ?Lab Work: ?Your physician recommends that you return for lab work in 5 weeks-  ?You need to have labs done when you are fasting.  You can come Monday through Friday 8:30 am to 12:00 pm and 1:15 to 4:30. You do not need to make an appointment as the order has already been placed. The labs you are going to have done are Lipids.  ?If you have labs (blood work) drawn today and your tests are completely normal, you will receive your results only by: ?MyChart Message (if you have MyChart) OR ?A paper copy in the mail ?If you have any lab test that is abnormal or we need to change your treatment, we will call you to review the results. ? ? ?Testing/Procedures: ?Radial artery ultrasound.  ?No preparation.  ?To be scheduled at [facility] ? ? ? ?Follow-Up: ?At [facility], you and your health needs are our priority.  As part of our continuing mission to provide you with exceptional heart care, we have created designated Provider Care Teams.  These Care Teams include your primary Cardiologist (physician) and Advanced Practice Providers (APPs -  Physician Assistants and Nurse Practitioners) who all work together to provide you with the care you need, when you need it. ? ?We recommend signing up for the patient portal called "MyChart".  Sign up information is provided on this After Visit Summary.  MyChart is used to connect with patients for Virtual Visits (Telemedicine).  Patients are able to view lab/test results, encounter notes, upcoming appointments, etc.  Non-urgent messages can be sent to your provider as well.   ?To learn more about what you can do with MyChart, go to [email or web address].   ? ?Your next appointment:    ?3 month(s) ? ?The format for your next appointment:   ?In Person ? ?Provider:   ?Jenne Campus, MD  ? ? ?Other Instructions ?NOne ? ?Important Information About Sugar ? ? ? ? ?  ?

## 2021-09-17 ENCOUNTER — Ambulatory Visit (HOSPITAL_COMMUNITY)
Admission: RE | Admit: 2021-09-17 | Discharge: 2021-09-17 | Disposition: A | Discharge status: Home / Self Care | Payer: PPO | Source: Ambulatory Visit | Attending: Internal Medicine | Admitting: Internal Medicine

## 2021-09-17 DIAGNOSIS — I728 Aneurysm of other specified arteries: Secondary | ICD-10-CM | POA: Diagnosis not present

## 2021-09-17 DIAGNOSIS — I729 Aneurysm of unspecified site: Secondary | ICD-10-CM | POA: Insufficient documentation

## 2021-09-17 DIAGNOSIS — T81718A Complication of other artery following a procedure, not elsewhere classified, initial encounter: Secondary | ICD-10-CM | POA: Diagnosis not present

## 2021-09-24 ENCOUNTER — Encounter: Payer: Self-pay | Admitting: Cardiology

## 2021-09-24 ENCOUNTER — Other Ambulatory Visit (HOSPITAL_COMMUNITY): Payer: PPO

## 2021-09-24 ENCOUNTER — Other Ambulatory Visit: Payer: Self-pay | Admitting: Family Medicine

## 2021-09-26 ENCOUNTER — Encounter: Payer: Self-pay | Admitting: Cardiology

## 2021-09-27 ENCOUNTER — Telehealth: Payer: Self-pay | Admitting: Family Medicine

## 2021-09-27 NOTE — Telephone Encounter (Signed)
Currently both are on his list. ?

## 2021-09-27 NOTE — Telephone Encounter (Signed)
Pharmacy is requiring clarification on dilitiazem. She stated they already have amlodipine on file and would like to know if new rx is replacing that one. Please advise.  ?

## 2021-09-27 NOTE — Telephone Encounter (Signed)
I had him on amlodipine from before for blood pressure control. I think cardiology put him on cardizem for rate/rhythm control, OK to be on both as he just had a visit with them. Ty.  ?

## 2021-09-27 NOTE — Telephone Encounter (Signed)
Called informed the pharmacist of PCP response ?Chart at pharmacy was updated. ? ?

## 2021-09-29 ENCOUNTER — Other Ambulatory Visit: Payer: Self-pay

## 2021-09-29 MED ORDER — LISINOPRIL 10 MG PO TABS
10.0000 mg | ORAL_TABLET | Freq: Every day | ORAL | 3 refills | Status: DC
Start: 2021-09-29 — End: 2021-10-26

## 2021-09-29 MED ORDER — LISINOPRIL 10 MG PO TABS: 10.0000 mg | ORAL_TABLET | Freq: Every day | ORAL | 3 refills | Status: DC

## 2021-10-03 ENCOUNTER — Other Ambulatory Visit: Payer: Self-pay | Admitting: Cardiovascular Disease

## 2021-10-04 ENCOUNTER — Encounter: Payer: Self-pay | Admitting: Cardiology

## 2021-10-19 ENCOUNTER — Ambulatory Visit: Payer: PPO | Admitting: Cardiology

## 2021-10-22 DIAGNOSIS — E782 Mixed hyperlipidemia: Secondary | ICD-10-CM | POA: Diagnosis not present

## 2021-10-23 LAB — LIPID PANEL
Chol/HDL Ratio: 3.4 ratio (ref 0.0–5.0)
Cholesterol, Total: 137 mg/dL (ref 100–199)
HDL: 40 mg/dL (ref >39)
LDL Chol Calc (NIH): 81 mg/dL (ref 0–99)
Triglycerides: 83 mg/dL (ref 0–149)
VLDL Cholesterol Cal: 16 mg/dL (ref 5–40)

## 2021-10-24 ENCOUNTER — Encounter: Payer: Self-pay | Admitting: Cardiology

## 2021-10-24 DIAGNOSIS — E782 Mixed hyperlipidemia: Secondary | ICD-10-CM

## 2021-10-24 DIAGNOSIS — I2510 Atherosclerotic heart disease of native coronary artery without angina pectoris: Secondary | ICD-10-CM

## 2021-10-24 DIAGNOSIS — Z955 Presence of coronary angioplasty implant and graft: Secondary | ICD-10-CM

## 2021-10-24 DIAGNOSIS — I10 Essential (primary) hypertension: Secondary | ICD-10-CM

## 2021-10-26 ENCOUNTER — Telehealth: Payer: Self-pay

## 2021-10-26 MED ORDER — LISINOPRIL 10 MG PO TABS: 10.0000 mg | ORAL_TABLET | Freq: Every day | ORAL | 3 refills | Status: DC

## 2021-10-26 MED ORDER — ROSUVASTATIN CALCIUM 40 MG PO TABS: 40.0000 mg | ORAL_TABLET | Freq: Every day | ORAL | 3 refills | Status: DC

## 2021-10-26 MED ORDER — CLOPIDOGREL BISULFATE 75 MG PO TABS: 75.0000 mg | ORAL_TABLET | Freq: Every day | ORAL | 3 refills | Status: DC

## 2021-10-26 NOTE — Telephone Encounter (Signed)
Results reviewed with pt as per Dr. Wendy Poet note. Encouraged pt to increase Crestor to '40mg'$  daily and re check Lipid, AST, ALT in 6-7 weeks. Pt verbalized understanding and had no additional questions. Routed to PCP

## 2021-10-26 NOTE — Telephone Encounter (Signed)
Spoke with pt about labs and increasing Crestor to '40mg'$  q day

## 2021-10-28 ENCOUNTER — Telehealth: Payer: Self-pay | Admitting: Cardiology

## 2021-10-28 NOTE — Telephone Encounter (Signed)
Advised that the RX was changed to Lisinopril 09/26/21.

## 2021-10-28 NOTE — Telephone Encounter (Signed)
Pt c/o medication issue:  1. Name of Medication:  lisinopril (ZESTRIL) 10 MG tablet  2. How are you currently taking this medication (dosage and times per day)?   3. Are you having a reaction (difficulty breathing--STAT)?   4. What is your medication issue?   Deb with [employer] they received Rx for Lisinopril. She would like to know if this will be replacing the Lisinopril-Hydrochlorothiazde or if patient still needs to take both. Please advise.  Phone#: [phone]  Can speak with any rep or leave message because voice mail is secure.

## 2021-10-29 DIAGNOSIS — R3915 Urgency of urination: Secondary | ICD-10-CM | POA: Diagnosis not present

## 2021-10-29 DIAGNOSIS — R300 Dysuria: Secondary | ICD-10-CM | POA: Diagnosis not present

## 2021-10-29 DIAGNOSIS — C61 Malignant neoplasm of prostate: Secondary | ICD-10-CM | POA: Diagnosis not present

## 2021-10-29 DIAGNOSIS — R3912 Poor urinary stream: Secondary | ICD-10-CM | POA: Diagnosis not present

## 2021-10-29 DIAGNOSIS — R350 Frequency of micturition: Secondary | ICD-10-CM | POA: Diagnosis not present

## 2021-11-11 ENCOUNTER — Ambulatory Visit: Payer: PPO | Admitting: Cardiology

## 2021-11-24 ENCOUNTER — Ambulatory Visit (INDEPENDENT_AMBULATORY_CARE_PROVIDER_SITE_OTHER): Payer: PPO

## 2021-11-24 ENCOUNTER — Ambulatory Visit: Payer: PPO | Admitting: Podiatry

## 2021-11-24 ENCOUNTER — Encounter: Payer: Self-pay | Admitting: Podiatry

## 2021-11-24 DIAGNOSIS — M722 Plantar fascial fibromatosis: Secondary | ICD-10-CM

## 2021-11-24 MED ORDER — DEXAMETHASONE SODIUM PHOSPHATE 120 MG/30ML IJ SOLN: 4.0000 mg | Freq: Once | INTRAMUSCULAR | Status: DC

## 2021-11-24 NOTE — Patient Instructions (Signed)

## 2021-11-24 NOTE — Progress Notes (Signed)
  Subjective:  Patient ID: Dean Guzman, male    DOB: 1951/03/25,   MRN: [id number]  Chief Complaint  Patient presents with   Foot Pain    Right heel pain x 3+ months. Sharp pains that makes pt feel like he is walking on a rock.     71 y.o. male presents for concern of right heel pain that has been going on for about three months. Relates sharp pain with first steps after sitting. Denies any treatments currently.  Also relates some numbness in his toes that he has dealt with for years and no reason for why and no treatments.  Denies any other pedal complaints. Denies n/v/f/c.   Past Medical History:  Diagnosis Date   Barrett's esophagus    BPH (benign prostatic hyperplasia)    Colon polyps    Complication of anesthesia    during biopsy, felt hot and sweating   FH: factor V Leiden mutation    niece died of leiden factor five complications, sister has   Frequent headaches    GERD (gastroesophageal reflux disease)    History of kidney stones    Hyperlipidemia    Hypertension    Migraines     no recent migraines had visual aura   Prostate cancer (HCC)     Objective:  Physical Exam: Vascular: DP/PT pulses 2/4 bilateral. CFT <3 seconds. Normal hair growth on digits. No edema.  Skin. No lacerations or abrasions bilateral feet.  Musculoskeletal: MMT 5/5 bilateral lower extremities in DF, PF, Inversion and Eversion. Deceased ROM in DF of ankle joint. Tender to medial calcaneal tubercle on the right. No pain along arch PT or achilles. No pain with calcaneal squeeze.  Neurological: Sensation intact to light touch.   Assessment:   1. Plantar fasciitis      Plan:  Patient was evaluated and treated and all questions answered. Discussed plantar fasciitis with patient.  X-rays reviewed and discussed with patient. No acute fractures or dislocations noted. Mild spurring noted at inferior calcaneus.  Discussed treatment options including, ice, NSAIDS, supportive shoes, bracing, and  stretching. Stretching exercises provided to be done on a daily basis.   Unable to take NSAIDS  Patient requesting injection today. Procedure note below.   PF brace dispensed.  Follow-up 6 weeks or sooner if any problems arise. In the meantime, encouraged to call the office with any questions, concerns, change in symptoms.   Procedure:  Discussed etiology, pathology, conservative vs. surgical therapies. At this time a plantar fascial injection was recommended.  The patient agreed and a sterile skin prep was applied.  An injection consisting of  dexamethasone and marcaine mixture was infiltrated at the point of maximal tenderness on the right Heel.  Bandaid applied. The patient tolerated this well and was given instructions for aftercare.    Lorenda Peck, DPM

## 2021-12-01 ENCOUNTER — Telehealth: Payer: Self-pay | Admitting: Podiatry

## 2021-12-01 NOTE — Telephone Encounter (Signed)
"  I saw Dr. Blenda Mounts on Wednesday, June 28.  She took a look at my right heel.  She said I have Plantar Fasciitis.  She gave me an injection of Dexamethasone in the heel.  I got relief from the pain that lasted one day.  The following day it felt pretty good, the next day, the pain came back.  It's been that way since then.  What I don't know is what to expect from that injection.  How long does it last?  I'm doing the exercises.  Is this normal?  Is there something else I need to do?  Do I need to see the doctor again?"  I returned his call.  I informed him that it's normal for the injection not to last longer upon receiving the first injection.  I scheduled him a follow-up appointment with Dr. Blenda Mounts for 12/14/2021 at 8 am.

## 2021-12-07 ENCOUNTER — Ambulatory Visit: Payer: PPO | Admitting: Cardiology

## 2021-12-13 DIAGNOSIS — E782 Mixed hyperlipidemia: Secondary | ICD-10-CM | POA: Diagnosis not present

## 2021-12-13 DIAGNOSIS — I2510 Atherosclerotic heart disease of native coronary artery without angina pectoris: Secondary | ICD-10-CM | POA: Diagnosis not present

## 2021-12-13 DIAGNOSIS — Z955 Presence of coronary angioplasty implant and graft: Secondary | ICD-10-CM | POA: Diagnosis not present

## 2021-12-14 ENCOUNTER — Encounter: Payer: Self-pay | Admitting: Cardiology

## 2021-12-14 ENCOUNTER — Ambulatory Visit: Payer: PPO | Admitting: Podiatry

## 2021-12-14 ENCOUNTER — Encounter: Payer: Self-pay | Admitting: Podiatry

## 2021-12-14 ENCOUNTER — Telehealth: Payer: Self-pay

## 2021-12-14 DIAGNOSIS — M722 Plantar fascial fibromatosis: Secondary | ICD-10-CM

## 2021-12-14 LAB — AST: AST: 22 IU/L (ref 0–40)

## 2021-12-14 LAB — LIPID PANEL
Chol/HDL Ratio: 3 ratio (ref 0.0–5.0)
Cholesterol, Total: 135 mg/dL (ref 100–199)
HDL: 45 mg/dL (ref >39)
LDL Chol Calc (NIH): 70 mg/dL (ref 0–99)
Triglycerides: 111 mg/dL (ref 0–149)
VLDL Cholesterol Cal: 20 mg/dL (ref 5–40)

## 2021-12-14 LAB — ALT: ALT: 24 IU/L (ref 0–44)

## 2021-12-14 MED ORDER — METHYLPREDNISOLONE 4 MG PO TBPK: ORAL_TABLET | ORAL | 0 refills | Status: DC

## 2021-12-14 NOTE — Telephone Encounter (Signed)
Sent pt message to PharmD. Called pt back with PharmD reply. Pot agreed and will go ahead with Prednisone given by his physician. He had no further questions or concerns.

## 2021-12-14 NOTE — Progress Notes (Signed)
  Subjective:  Patient ID: Dean Guzman, male    DOB: 03/26/1951,   MRN: [id number]  Chief Complaint  Patient presents with   Plantar Fasciitis    Patient states previous visit he received an injection and only got relief for 1 day. Painful when pressure is applied. Patient states he did receive an pf brace but he states it is not very helpful    71 y.o. male presents for follow-up of right plantar fasciitis. States the injection did not help and has not improved. Has only been three weeks. Relates he has been stretching and not sure the brace is helping.  Denies any other pedal complaints. Denies n/v/f/c.   Past Medical History:  Diagnosis Date   Barrett's esophagus    BPH (benign prostatic hyperplasia)    Colon polyps    Complication of anesthesia    during biopsy, felt hot and sweating   FH: factor V Leiden mutation    niece died of leiden factor five complications, sister has   Frequent headaches    GERD (gastroesophageal reflux disease)    History of kidney stones    Hyperlipidemia    Hypertension    Migraines     no recent migraines had visual aura   Prostate cancer (HCC)     Objective:  Physical Exam: Vascular: DP/PT pulses 2/4 bilateral. CFT <3 seconds. Normal hair growth on digits. No edema.  Skin. No lacerations or abrasions bilateral feet.  Musculoskeletal: MMT 5/5 bilateral lower extremities in DF, PF, Inversion and Eversion. Deceased ROM in DF of ankle joint. Tender to medial calcaneal tubercle on the right. No pain along arch PT or achilles. No pain with calcaneal squeeze.  Neurological: Sensation intact to light touch.   Assessment:   1. Plantar fasciitis of right foot       Plan:  Patient was evaluated and treated and all questions answered. Discussed plantar fasciitis with patient.  X-rays reviewed and discussed with patient. No acute fractures or dislocations noted. Mild spurring noted at inferior calcaneus.  Discussed treatment options including,  ice, NSAIDS, supportive shoes, bracing, and stretching. Stretching exercises provided to be done on a daily basis.   Unable to take NSAIDS  Patient requesting injection today too early for injection. Will send over Medrol dose pack.   Continue with supportive shoes. Has new shoes coming. Avoid barefoot.  Follow-up 6 weeks or sooner if any problems arise. In the meantime, encouraged to call the office with any questions, concerns, change in symptoms.      Lorenda Peck, DPM

## 2021-12-15 ENCOUNTER — Ambulatory Visit: Payer: Medicare Other | Admitting: Podiatry

## 2021-12-16 ENCOUNTER — Encounter: Payer: Self-pay | Admitting: Cardiology

## 2021-12-16 DIAGNOSIS — C61 Malignant neoplasm of prostate: Secondary | ICD-10-CM | POA: Diagnosis not present

## 2021-12-16 DIAGNOSIS — R350 Frequency of micturition: Secondary | ICD-10-CM | POA: Diagnosis not present

## 2021-12-16 DIAGNOSIS — N5201 Erectile dysfunction due to arterial insufficiency: Secondary | ICD-10-CM | POA: Diagnosis not present

## 2021-12-16 DIAGNOSIS — R351 Nocturia: Secondary | ICD-10-CM | POA: Diagnosis not present

## 2021-12-19 ENCOUNTER — Encounter: Payer: Self-pay | Admitting: Family Medicine

## 2021-12-21 ENCOUNTER — Ambulatory Visit (INDEPENDENT_AMBULATORY_CARE_PROVIDER_SITE_OTHER): Payer: PPO

## 2021-12-21 ENCOUNTER — Ambulatory Visit: Payer: PPO | Admitting: Cardiology

## 2021-12-21 ENCOUNTER — Encounter: Payer: Self-pay | Admitting: Cardiology

## 2021-12-21 VITALS — BP 124/64 | HR 56 | Ht 74.0 in | Wt 237.8 lb

## 2021-12-21 DIAGNOSIS — R0609 Other forms of dyspnea: Secondary | ICD-10-CM

## 2021-12-21 DIAGNOSIS — I10 Essential (primary) hypertension: Secondary | ICD-10-CM | POA: Diagnosis not present

## 2021-12-21 DIAGNOSIS — R55 Syncope and collapse: Secondary | ICD-10-CM

## 2021-12-21 DIAGNOSIS — K22719 Barrett's esophagus with dysplasia, unspecified: Secondary | ICD-10-CM

## 2021-12-21 DIAGNOSIS — I2510 Atherosclerotic heart disease of native coronary artery without angina pectoris: Secondary | ICD-10-CM

## 2021-12-21 DIAGNOSIS — I4729 Other ventricular tachycardia: Secondary | ICD-10-CM | POA: Diagnosis not present

## 2021-12-21 HISTORY — DX: Other forms of dyspnea: R06.09

## 2021-12-21 MED ORDER — ISOSORBIDE MONONITRATE ER 30 MG PO TB24: 30.0000 mg | ORAL_TABLET | Freq: Every day | ORAL | 3 refills | Status: DC

## 2021-12-21 NOTE — Patient Instructions (Addendum)
Medication Instructions:  Your physician has recommended you make the following change in your medication: START: Imdur '30mg'$  1 tablet daily by mouth    Lab Work: None Ordered If you have labs (blood work) drawn today and your tests are completely normal, you will receive your results only by: [place] (if you have MyChart) OR A paper copy in the mail If you have any lab test that is abnormal or we need to change your treatment, we will call you to review the results.   Testing/Procedures:  WHY IS MY DOCTOR PRESCRIBING ZIO? The Zio system is proven and trusted by physicians to detect and diagnose irregular heart rhythms -- and has been prescribed to hundreds of thousands of patients.  The FDA has cleared the Zio system to monitor for many different kinds of irregular heart rhythms. In a study, physicians were able to reach a diagnosis 90% of the time with the Zio system1.  You can wear the Zio monitor -- a small, discreet, comfortable patch -- during your normal day-to-day activity, including while you sleep, shower, and exercise, while it records every single heartbeat for analysis.  1Barrett, P., et al. Comparison of 24 Hour Holter Monitoring Versus 14 Day Novel Adhesive Patch Electrocardiographic Monitoring. Gurley, 2014.  ZIO VS. HOLTER MONITORING The Zio monitor can be comfortably worn for up to 14 days. Holter monitors can be worn for 24 to 48 hours, limiting the time to record any irregular heart rhythms you may have. Zio is able to capture data for the 51% of patients who have their first symptom-triggered arrhythmia after 48 hours.1  LIVE WITHOUT RESTRICTIONS The Zio ambulatory cardiac monitor is a small, unobtrusive, and water-resistant patch--you might even forget you're wearing it. The Zio monitor records and stores every beat of your heart, whether you're sleeping, working out, or showering.     Follow-Up: At [facility], you and your health  needs are our priority.  As part of our continuing mission to provide you with exceptional heart care, we have created designated Provider Care Teams.  These Care Teams include your primary Cardiologist (physician) and Advanced Practice Providers (APPs -  Physician Assistants and Nurse Practitioners) who all work together to provide you with the care you need, when you need it.  We recommend signing up for the patient portal called "MyChart".  Sign up information is provided on this After Visit Summary.  MyChart is used to connect with patients for Virtual Visits (Telemedicine).  Patients are able to view lab/test results, encounter notes, upcoming appointments, etc.  Non-urgent messages can be sent to your provider as well.   To learn more about what you can do with MyChart, go to [email or web address].    Your next appointment:   6 month(s)  The format for your next appointment:   In Person  Provider:   Jenne Campus, MD    Other Instructions NA

## 2021-12-21 NOTE — Addendum Note (Signed)
Addended by: Jacobo Forest D on: 12/21/2021 01:53 PM   Modules accepted: Orders

## 2021-12-21 NOTE — Progress Notes (Signed)
Cardiology Office Note:    Date:  12/21/2021   ID:  Dean Guzman, DOB May 01, 1951, MRN [id number]  PCP:  Shelda Pal, DO  Cardiologist:  Jenne Campus, MD    Referring MD: Shelda Pal*   Chief Complaint  Patient presents with   Follow-up    History of Present Illness:    Dean Guzman is a 71 y.o. male  with past medical history significant for factor V Leiden mutation, dyslipidemia, essential hypertension, initially he was referred to [place] because of episode of dizziness and near syncope, monitor was placed showed some ventricular tachycardia after that echocardiogram performed showing preserved ejection fraction, as a part of stratification of this arrhythmia he had stress test done which surprisingly showed old myocardial infarction with peri-infarct ischemia involving inferior wall.  After the cardiac catheterization was done and was find to have mid LAD lesion which was hemodynamically significant that was addressed with drug-eluting stent. He is in my office today for follow-up.  He said he is doing well but he complained of having shortness of breath which is surprising symptoms that he did not have before he said he did not happen before his stenting there is no chest pain tightness squeezing pressure burning chest just simply shortness of breath that happens sometimes he does not get it all the time.  Otherwise denies have any chest pain tightness squeezing pressure burning chest  Past Medical History:  Diagnosis Date   Barrett's esophagus    BPH (benign prostatic hyperplasia)    Colon polyps    Complication of anesthesia    during biopsy, felt hot and sweating   FH: factor V Leiden mutation    niece died of leiden factor five complications, sister has   Frequent headaches    GERD (gastroesophageal reflux disease)    History of kidney stones    Hyperlipidemia    Hypertension    Migraines     no recent migraines had visual aura   Prostate cancer  [facility])     Past Surgical History:  Procedure Laterality Date   colonscopy     CORONARY ANGIOGRAPHY N/A 09/08/2021   Procedure: CORONARY ANGIOGRAPHY;  Surgeon: Sherren Mocha, MD;  Location: [place] [facility];  Service: Cardiovascular;  Laterality: N/A;   CORONARY STENT INTERVENTION N/A 09/08/2021   Procedure: CORONARY STENT INTERVENTION;  Surgeon: Sherren Mocha, MD;  Location: [place] [facility];  Service: Cardiovascular;  Laterality: N/A;   CYSTOSCOPY N/A 01/13/2020   Procedure: CYSTOSCOPY FLEXIBLE;  Surgeon: Lucas Mallow, MD;  Location: [facility];  Service: Urology;  Laterality: N/A;  NO SEEDS FOUND IN BLADDER   INTRAVASCULAR PRESSURE WIRE/FFR STUDY N/A 09/08/2021   Procedure: INTRAVASCULAR PRESSURE WIRE/FFR STUDY;  Surgeon: Sherren Mocha, MD;  Location: [place] [facility];  Service: Cardiovascular;  Laterality: N/A;   MASS EXCISION Left 02/08/2021   Procedure: EXCISION MASS OF MUCOID CYST WITH DISTAL INTERPHANGEAL JOINT ARTHROTOMY LEFT MIDDLE FINGER;  Surgeon: [name], MD;  Location: [facility];  Service: Orthopedics;  Laterality: Left;   NASAL ENDOSCOPY     PROSTATE BIOPSY  may 2021 and 2020   RADIOACTIVE SEED IMPLANT N/A 01/13/2020   Procedure: RADIOACTIVE SEED IMPLANT/BRACHYTHERAPY IMPLANT;  Surgeon: Lucas Mallow, MD;  Location: [place];  Service: Urology;  Laterality: N/A;    73 SEEDS IMPLANTED   SPACE OAR INSTILLATION N/A 01/13/2020   Procedure: SPACE OAR INSTILLATION;  Surgeon: Lucas Mallow, MD;  Location:  [place];  Service: Urology;  Laterality: N/A;    Current Medications: Current Meds  Medication Sig   amLODipine (NORVASC) 5 MG tablet Take 1 tablet by mouth daily. (Patient taking differently: Take 5 mg by mouth daily.)   aspirin 81 MG EC tablet Take 1 tablet (81 mg total) by mouth daily.   cholecalciferol (VITAMIN D3) 25 MCG (1000 UT) tablet Take 1,000 Units by mouth daily.    clopidogrel (PLAVIX) 75 MG tablet Take 1 tablet (75 mg total) by mouth daily.   diltiazem (DILACOR XR) 240 MG 24 hr capsule Take 1 capsule by mouth daily. (Patient taking differently: Take 240 mg by mouth daily. Take 1 capsule by mouth daily.)   ipratropium (ATROVENT) 0.06 % nasal spray Instill 2 sprays into each nostril three times daily as needed for rhinitis. (Patient taking differently: Place 2 sprays into both nostrils as needed for rhinitis.)   lisinopril (ZESTRIL) 10 MG tablet Take 1 tablet (10 mg total) by mouth daily.   nitroGLYCERIN (NITROSTAT) 0.4 MG SL tablet Place 1 tablet (0.4 mg total) under the tongue every 5 (five) minutes as needed for chest pain. Do not use with tadalafil and sildenafil   Omega-3 Fatty Acids (FISH OIL) 1200 MG CAPS Take 1,200 mg by mouth 2 (two) times daily.   pantoprazole (PROTONIX) 40 MG tablet Take 40 mg by mouth daily.   rosuvastatin (CRESTOR) 40 MG tablet Take 1 tablet (40 mg total) by mouth daily.   Vibegron (GEMTESA) 75 MG TABS Take 75 mg by mouth daily.   Current Facility-Administered Medications for the 12/21/21 encounter (Office Visit) with Park Liter, MD  Medication   dexamethasone (DECADRON) injection 4 mg     Allergies:   Patient has no known allergies.   Social History   Socioeconomic History   Marital status: Married    Spouse name: Not on file   Number of children: 3   Years of education: Not on file   Highest education level: Not on file  Occupational History    Comment: full time  Tobacco Use   Smoking status: Former    Packs/day: 1.00    Years: 12.00    Total pack years: 12.00    Types: Cigarettes    Quit date: 05/30/1978    Years since quitting: 43.5   Smokeless tobacco: Never  Vaping Use   Vaping Use: Never used  Substance and Sexual Activity   Alcohol use: Yes    Comment: rarely   Drug use: Not Currently   Sexual activity: Yes  Other Topics Concern   Not on file  Social History Narrative   Not on file    Social Determinants of Health   Financial Resource Strain: Not on file  Food Insecurity: Not on file  Transportation Needs: Not on file  Physical Activity: Not on file  Stress: Not on file  Social Connections: Not on file     Family History: The patient's family history includes Arthritis in [place]'s mother; Breast cancer in [place]'s sister; Diabetes in [place]'s maternal grandmother; Early death in [place]'s paternal grandfather and paternal grandmother; Hearing loss in [place]'s paternal grandfather and paternal grandmother; Heart disease in [place]'s father; Hyperlipidemia in [place]'s father; Hypertension in [place]'s father. There is no history of Allergic rhinitis, Asthma, Eczema, Urticaria, Prostate cancer, Colon cancer, or Pancreatic cancer. ROS:   Please see the history of present illness.    All 14 point review of systems  negative except as described per history of present illness  EKGs/Labs/Other Studies Reviewed:      Recent Labs: 06/29/2021: TSH 1.18 09/02/2021: BUN 21; Creatinine, Ser 1.17; Hemoglobin 14.6; Platelets 208; Potassium 4.9; Sodium 140 12/13/2021: ALT 24  Recent Lipid Panel    Component Value Date/Time   CHOL 135 12/13/2021 0905   TRIG 111 12/13/2021 0905   HDL 45 12/13/2021 0905   CHOLHDL 3.0 12/13/2021 0905   CHOLHDL 4 06/29/2021 1107   VLDL 22.0 06/29/2021 1107   LDLCALC 70 12/13/2021 0905    Physical Exam:    VS:  BP 124/64 (BP Location: Left Arm, Patient Position: Sitting)   Pulse (!) 56   Ht '6\' 2"'$  (1.88 m)   Wt 237 lb 12.8 oz (107.9 kg)   SpO2 96%   BMI 30.53 kg/m     Wt Readings from Last 3 Encounters:  12/21/21 237 lb 12.8 oz (107.9 kg)  09/16/21 238 lb 6.4 oz (108.1 kg)  09/14/21 240 lb (108.9 kg)     GEN:  Well nourished, well developed in no acute distress HEENT: Normal NECK: No JVD; No carotid bruits LYMPHATICS: No lymphadenopathy CARDIAC: RRR, no murmurs, no rubs,  no gallops RESPIRATORY:  Clear to auscultation without rales, wheezing or rhonchi  ABDOMEN: Soft, non-tender, non-distended MUSCULOSKELETAL:  No edema; No deformity  SKIN: Warm and dry LOWER EXTREMITIES: no swelling NEUROLOGIC:  Alert and oriented x 3 PSYCHIATRIC:  Normal affect   ASSESSMENT:    1. Coronary artery disease involving native coronary artery of native heart without angina pectoris   2. Paroxysmal ventricular tachycardia ([place])   3. Essential hypertension   4. Near syncope   5. Barrett's esophagus with dysplasia   6. Dyspnea on exertion    PLAN:    In order of problems listed above:  Coronary disease stable from that point review more about his symptoms of shortness of breath and more about potentially having angina equivalent.  I will try to intensify his antianginal therapy.  I will put him on Imdur 30 mg daily.  I warned her about potential side effect of headache and ask him to take Tylenol.  I also told him if he needs to take Tylenol for the next 3 days he takes Imdur that medication is not for him. Paroxysmal ventricular tachycardia we will ask him to wear Zio patch for 2 weeks to see if he had any recurrences of this phenomenon. Essential hypertension blood pressure seems to be well controlled Dyslipidemia I did review K PN which show me his LDL 70 HDL 45 this is from 12/13/2021 Near syncope: Denies having any   Medication Adjustments/Labs and Tests Ordered: Current medicines are reviewed at length with the patient today.  Concerns regarding medicines are outlined above.  No orders of the defined types were placed in this encounter.  Medication changes: No orders of the defined types were placed in this encounter.   Signed, Park Liter, MD, [facility] 12/21/2021 1:29 PM    [place]

## 2021-12-26 ENCOUNTER — Encounter: Payer: Self-pay | Admitting: Cardiology

## 2021-12-28 ENCOUNTER — Encounter: Payer: Self-pay | Admitting: Family Medicine

## 2021-12-28 ENCOUNTER — Ambulatory Visit (INDEPENDENT_AMBULATORY_CARE_PROVIDER_SITE_OTHER): Payer: PPO | Admitting: Family Medicine

## 2021-12-28 VITALS — BP 122/82 | HR 54 | Temp 98.1°F | Ht 74.0 in | Wt 237.4 lb

## 2021-12-28 DIAGNOSIS — Z0000 Encounter for general adult medical examination without abnormal findings: Secondary | ICD-10-CM | POA: Diagnosis not present

## 2021-12-28 NOTE — Patient Instructions (Addendum)
Keep the diet clean and stay active.  I recommend getting the flu shot in mid October. This suggestion would change if the CDC comes out with a different recommendation.   Let us know if you need anything.

## 2021-12-28 NOTE — Progress Notes (Signed)
Chief Complaint  Patient presents with   Annual Exam    Well Male Dean Dean Guzman is here for a complete physical.   Dean Guzman's last physical was >1 year ago.  Current diet: in general, a "healthy" diet.   Current exercise: active in yard Weight trend: stable Fatigue out of ordinary? No. Seat belt? Yes.   Advanced directive? No  Health maintenance Shingrix- Yes Colonoscopy- Yes Tetanus- Yes Hep C- Yes Pneumonia vaccine- Yes  Past Medical History:  Diagnosis Date   Barrett's esophagus    BPH (benign prostatic hyperplasia)    Colon polyps    Complication of anesthesia    during biopsy, felt hot and sweating   FH: Dean Guzman    niece died of leiden Dean five complications, sister has   Frequent headaches    GERD (gastroesophageal reflux disease)    History of kidney stones    Hyperlipidemia    Hypertension    Migraines     no recent migraines had visual aura   Prostate cancer [facility])      Past Surgical History:  Procedure Laterality Date   colonscopy     CORONARY ANGIOGRAPHY N/A 09/08/2021   Procedure: CORONARY ANGIOGRAPHY;  Surgeon: Dean Mocha, MD;  Location: [place] [facility];  Service: Cardiovascular;  Laterality: N/A;   CORONARY STENT INTERVENTION N/A 09/08/2021   Procedure: CORONARY STENT INTERVENTION;  Surgeon: Dean Mocha, MD;  Location: [place] [facility];  Service: Cardiovascular;  Laterality: N/A;   CYSTOSCOPY N/A 01/13/2020   Procedure: CYSTOSCOPY FLEXIBLE;  Surgeon: Dean Mallow, MD;  Location: [facility];  Service: Urology;  Laterality: N/A;  NO SEEDS FOUND IN BLADDER   INTRAVASCULAR PRESSURE WIRE/FFR STUDY N/A 09/08/2021   Procedure: INTRAVASCULAR PRESSURE WIRE/FFR STUDY;  Surgeon: Dean Mocha, MD;  Location: [place] [facility];  Service: Cardiovascular;  Laterality: N/A;   MASS EXCISION Left 02/08/2021   Procedure: EXCISION MASS OF MUCOID CYST WITH DISTAL INTERPHANGEAL JOINT ARTHROTOMY LEFT MIDDLE  FINGER;  Surgeon: Dean Cover, MD;  Location: [place];  Service: Orthopedics;  Laterality: Left;   NASAL ENDOSCOPY     PROSTATE BIOPSY  may 2021 and 2020   RADIOACTIVE SEED IMPLANT N/A 01/13/2020   Procedure: RADIOACTIVE SEED IMPLANT/BRACHYTHERAPY IMPLANT;  Surgeon: Dean Mallow, MD;  Location: [place];  Service: Urology;  Laterality: N/A;    73 SEEDS IMPLANTED   SPACE OAR INSTILLATION N/A 01/13/2020   Procedure: SPACE OAR INSTILLATION;  Surgeon: Dean Mallow, MD;  Location: [facility];  Service: Urology;  Laterality: N/A;    Medications  Current Outpatient Medications on File Prior to Visit  Medication Sig Dispense Refill   amLODipine (NORVASC) 5 MG tablet Take 1 tablet by mouth daily. (Patient taking differently: Take 5 mg by mouth daily.) 90 tablet 0   aspirin 81 MG EC tablet Take 1 tablet (81 mg total) by mouth daily. 90 tablet 1   cholecalciferol (VITAMIN D3) 25 MCG (1000 UT) tablet Take 1,000 Units by mouth daily.     clopidogrel (PLAVIX) 75 MG tablet Take 1 tablet (75 mg total) by mouth daily. 90 tablet 3   diltiazem (DILACOR XR) 240 MG 24 hr capsule Take 1 capsule by mouth daily. (Patient taking differently: Take 240 mg by mouth daily. Take 1 capsule by mouth daily.) 90 capsule 0   ipratropium (ATROVENT) 0.06 % nasal spray Instill 2 sprays into each nostril three times daily as  needed for rhinitis. (Patient taking differently: Place 2 sprays into both nostrils as needed for rhinitis.) 15 mL 2   lisinopril (ZESTRIL) 10 MG tablet Take 1 tablet (10 mg total) by mouth daily. 90 tablet 3   nitroGLYCERIN (NITROSTAT) 0.4 MG SL tablet Place 1 tablet (0.4 mg total) under the tongue every 5 (five) minutes as needed for chest pain. Do not use with tadalafil and sildenafil 25 tablet 2   Omega-3 Fatty Acids (FISH OIL) 1200 MG CAPS Take 1,200 mg by mouth 2 (two) times daily.     pantoprazole (PROTONIX) 40 MG tablet Take 40 mg by mouth  daily.     rosuvastatin (CRESTOR) 40 MG tablet Take 1 tablet (40 mg total) by mouth daily. 90 tablet 3   Vibegron (GEMTESA) 75 MG TABS Take 75 mg by mouth daily.      Allergies No Known Allergies  Family History Family History  Problem Relation Age of Onset   Arthritis Mother    Heart disease Father    Hyperlipidemia Father    Hypertension Father    Breast cancer Sister    Diabetes Maternal Grandmother    Hearing loss Paternal Grandmother    Early death Paternal Grandmother    Hearing loss Paternal Grandfather    Early death Paternal Grandfather    Allergic rhinitis Neg Hx    Asthma Neg Hx    Eczema Neg Hx    Urticaria Neg Hx    Prostate cancer Neg Hx    Colon cancer Neg Hx    Pancreatic cancer Neg Hx     Review of Systems: Constitutional:  no fevers Eye:  no recent significant change in vision Ears:  No changes in hearing Nose/Mouth/Throat:  no complaints of nasal congestion, no sore throat Cardiovascular: no chest pain Respiratory:  No shortness of breath Gastrointestinal:  No change in bowel habits GU:  +frequency Integumentary:  no abnormal skin lesions reported Neurologic:  no headaches Endocrine:  denies unexplained weight changes  Exam BP 122/82   Pulse (!) 54   Temp 98.1 F (36.7 C) (Oral)   Ht '6\' 2"'$  (1.88 m)   Wt 237 lb 6 oz (107.7 kg)   SpO2 96%   BMI 30.48 kg/m  General:  well developed, well nourished, in no apparent distress Skin:  no significant moles, warts, or growths Head:  no masses, lesions, or tenderness Eyes:  pupils equal and round, sclera anicteric without injection Ears:  canals without lesions, TMs shiny without retraction, no obvious effusion, no erythema Nose:  nares patent, septum midline, mucosa normal Throat/Pharynx:  lips and gingiva without lesion; tongue and uvula midline; non-inflamed pharynx; no exudates or postnasal drainage Lungs:  clear to auscultation, breath sounds equal bilaterally, no respiratory distress Cardio:   regular rhythm, bradycardic, no LE edema or bruits Rectal: Deferred GI: BS+, S, NT, ND, no masses or organomegaly Musculoskeletal:  symmetrical muscle groups noted without atrophy or deformity Neuro:  gait normal; deep tendon reflexes normal and symmetric Psych: well oriented with normal range of affect and appropriate judgment/insight  Assessment and Plan  Well adult exam   Well 71 y.o. male. Counseled on diet and exercise. Had labs done recently. Flu shot for mid Oct.  Follow up in in 6 mo or prn.  The patient voiced understanding and agreement to the plan.  Emm[place], DO 12/28/21 9:44 AM

## 2022-01-05 ENCOUNTER — Ambulatory Visit: Payer: PPO | Admitting: Podiatry

## 2022-01-05 DIAGNOSIS — N5201 Erectile dysfunction due to arterial insufficiency: Secondary | ICD-10-CM | POA: Diagnosis not present

## 2022-01-11 ENCOUNTER — Encounter (HOSPITAL_BASED_OUTPATIENT_CLINIC_OR_DEPARTMENT_OTHER): Payer: PPO | Attending: Internal Medicine | Admitting: Internal Medicine

## 2022-01-11 DIAGNOSIS — C61 Malignant neoplasm of prostate: Secondary | ICD-10-CM | POA: Insufficient documentation

## 2022-01-11 DIAGNOSIS — Z955 Presence of coronary angioplasty implant and graft: Secondary | ICD-10-CM | POA: Diagnosis not present

## 2022-01-11 DIAGNOSIS — N3040 Irradiation cystitis without hematuria: Secondary | ICD-10-CM | POA: Insufficient documentation

## 2022-01-11 DIAGNOSIS — R55 Syncope and collapse: Secondary | ICD-10-CM | POA: Diagnosis not present

## 2022-01-11 DIAGNOSIS — I2510 Atherosclerotic heart disease of native coronary artery without angina pectoris: Secondary | ICD-10-CM | POA: Insufficient documentation

## 2022-01-12 NOTE — Progress Notes (Signed)
Dean Guzman, Dean Guzman ([id number]) Visit Report for 01/11/2022 Abuse Risk Screen Details Patient Name: Date of Service: Dean Guzman, Dean Guzman 01/11/2022 9:45 A M Medical Record Number: [id number] Patient Account Number: [id number] Date of Birth/Sex: Treating RN: [birth year] (71 y.o. [name] Primary Care Levora Werden: Riki Sheer Other Clinician: Referring Marjory Meints: Treating Rawlins Stuard/Extender: [name], [name] Weeks in Treatment: 0 Abuse Risk Screen Items Answer ABUSE RISK SCREEN: Has anyone close to you tried to hurt or harm you recentlyo No Do you feel uncomfortable with anyone in your familyo No Has anyone forced you do things that you didnt want to doo No Electronic Signature(s) Signed: 01/11/2022 4:22:40 PM By: Deon Pilling RN, BSN Entered By: Deon Pilling on 01/11/2022 09:56:13 -------------------------------------------------------------------------------- Activities of Daily Living Details Patient Name: Date of Service: KAZ, AULD 01/11/2022 9:45 A M Medical Record Number: [id number] Patient Account Number: [id number] Date of Birth/Sex: Treating RN: 08-03-1950 (71 y.o. [name] Primary Care Keghan Mcfarren: Riki Sheer Other Clinician: Referring Asad Keeven: Treating Doreena Maulden/Extender: [name], [name] Weeks in Treatment: 0 Activities of Daily Living Items Answer Activities of Daily Living (Please select one for each item) Drive Automobile Completely Able T Medications ake Completely Able Use T elephone Completely Able Care for Appearance Completely Able Use T oilet Completely Able Bath / Shower Completely Able Dress Self Completely Able Feed Self Completely Able Walk Completely Able Get In / Out Bed Completely Able Housework Completely Able Prepare Meals Completely [place] for Self Completely Able Electronic Signature(s) Signed: 01/11/2022 4:22:40 PM By: Deon Pilling RN, BSN Entered  By: Deon Pilling on 01/11/2022 09:56:29 -------------------------------------------------------------------------------- Education Screening Details Patient Name: Date of Service: Dean Guzman, Dean Guzman 01/11/2022 9:45 A M Medical Record Number: [id number] Patient Account Number: [id number] Date of Birth/Sex: Treating RN: [birth year] (71 y.o. [name] Primary Care Randalyn Ahmed: Riki Sheer Other Clinician: Referring Ulyess Muto: Treating Tanush Drees/Extender: [name], [name] Weeks in Treatment: 0 Primary Learner Assessed: Patient Learning Preferences/Education Level/Primary Language Learning Preference: Explanation, Demonstration, Printed Material Highest Education Level: High School Preferred Language: English Cognitive Barrier Language Barrier: No Translator Needed: No Memory Deficit: No Emotional Barrier: No Cultural/Religious Beliefs Affecting Medical Care: No Physical Barrier Impaired Vision: Yes Glasses Impaired Hearing: No Decreased Hand dexterity: No Knowledge/Comprehension Knowledge Level: High Comprehension Level: High Ability to understand written instructions: High Ability to understand verbal instructions: High Motivation Anxiety Level: Calm Cooperation: Cooperative Education Importance: Acknowledges Need Interest in Health Problems: Asks Questions Perception: Coherent Willingness to Engage in Self-Management High Activities: Readiness to Engage in Self-Management High Activities: Electronic Signature(s) Signed: 01/11/2022 4:22:40 PM By: Deon Pilling RN, BSN Entered By: Deon Pilling on 01/11/2022 09:56:49 -------------------------------------------------------------------------------- Fall Risk Assessment Details Patient Name: Date of Service: Dean Guzman, Dean Guzman 01/11/2022 9:45 A M Medical Record Number: [id number] Patient Account Number: [id number] Date of Birth/Sex: Treating RN: [birth year] (71 y.o. [name] Primary Care  Cecily Lawhorne: Riki Sheer Other Clinician: Referring Samika Vetsch: Treating Skyra Crichlow/Extender: [name], [name] Weeks in Treatment: 0 Fall Risk Assessment Items Have you had 2 or more falls in the last 12 monthso 0 No Have you had any fall that resulted in injury in the last 12 monthso 0 No FALLS RISK SCREEN History of falling - immediate or within 3 months 0 No Secondary diagnosis (Do you have 2 or more medical diagnoseso) 0 No Ambulatory aid None/bed rest/wheelchair/nurse 0 Yes Crutches/cane/walker 0 No Furniture 0 No Intravenous therapy Access/Saline/Heparin Lock 0 No Gait/Transferring Normal/ bed  rest/ wheelchair 0 Yes Weak (short steps with or without shuffle, stooped but able to lift head while walking, may seek 0 No support from furniture) Impaired (short steps with shuffle, may have difficulty arising from chair, head down, impaired 0 No balance) Mental Status Oriented to own ability 0 Yes Electronic Signature(s) Signed: 01/11/2022 4:22:40 PM By: Deon Pilling RN, BSN Entered By: Deon Pilling on 01/11/2022 09:56:56 -------------------------------------------------------------------------------- Foot Assessment Details Patient Name: Date of Service: Dean Guzman, Dean Guzman 01/11/2022 9:45 A M Medical Record Number: [id number] Patient Account Number: [id number] Date of Birth/Sex: Treating RN: [birth year] (71 y.o. [name] Primary Care Tallia Moehring: Riki Sheer Other Clinician: Referring Jamileth Putzier: Treating Cedrick Partain/Extender: [name], [name] Weeks in Treatment: 0 Foot Assessment Items Site Locations + = Sensation present, - = Sensation absent, C = Callus, U = Ulcer R = Redness, W = Warmth, M = Maceration, PU = Pre-ulcerative lesion F = Fissure, S = Swelling, D = Dryness Assessment Right: Left: Other Deformity: No No Prior Foot Ulcer: No No Prior Amputation: No No Charcot Joint: No No Ambulatory Status: Gait: Notes No BLE  wounds. Electronic Signature(s) Signed: 01/11/2022 4:22:40 PM By: Deon Pilling RN, BSN Entered By: Deon Pilling on 01/11/2022 09:57:12 -------------------------------------------------------------------------------- Nutrition Risk Screening Details Patient Name: Date of Service: Dean Guzman, Dean Guzman 01/11/2022 9:45 A M Medical Record Number: [id number] Patient Account Number: [id number] Date of Birth/Sex: Treating RN: 05-12-1951 (71 y.o. [name] Primary Care Sydney Hasten: Riki Sheer Other Clinician: Referring Chanel Mckesson: Treating Santos Sollenberger/Extender: [name], [name] Weeks in Treatment: 0 Height (in): Weight (lbs): Body Mass Index (BMI): Nutrition Risk Screening Items Score Screening NUTRITION RISK SCREEN: I have an illness or condition that made me change the kind and/or amount of food I eat 2 Yes I eat fewer than two meals per day 0 No I eat few fruits and vegetables, or milk products 0 No I have three or more drinks of beer, liquor or wine almost every day 0 No I have tooth or mouth problems that make it hard for me to eat 0 No I don't always have enough money to buy the food I need 0 No I eat alone most of the time 0 No I take three or more different prescribed or over-the-counter drugs a day 1 Yes Without wanting to, I have lost or gained 10 pounds in the last six months 0 No I am not always physically able to shop, cook and/or feed myself 0 No Nutrition Protocols Good Risk Protocol Moderate Risk Protocol 0 Provide education on nutrition High Risk Proctocol Risk Level: Moderate Risk Score: 3 Electronic Signature(s) Signed: 01/11/2022 4:22:40 PM By: Deon Pilling RN, BSN Entered By: Deon Pilling on 01/11/2022 09:57:01

## 2022-01-12 NOTE — Progress Notes (Addendum)
Dean Guzman, Dean Guzman ([id number]) Visit Report for 01/11/2022 Allergy List Details Patient Name: Date of Service: Dean Guzman, Dean Guzman 01/11/2022 9:45 A M Medical Record Number: [id number] Patient Account Number: [id number] Date of Birth/Sex: Treating RN: [birth year] (71 y.o. Dean Guzman Primary Care Dean Guzman: Dean Guzman Other Clinician: Referring Dean Guzman: Treating Dean Guzman/Extender: Dean Guzman, Dean Guzman Weeks in Treatment: 0 Allergies Active Allergies No Known Drug Allergies Allergy Notes Electronic Signature(s) Signed: 01/11/2022 4:22:40 PM By: Deon Pilling RN, BSN Entered By: Deon Pilling on 01/10/2022 13:41:45 -------------------------------------------------------------------------------- Arrival Information Details Patient Name: Date of Service: Dean Guzman, Dean Guzman 01/11/2022 9:45 A M Medical Record Number: [id number] Patient Account Number: [id number] Date of Birth/Sex: Treating RN: Dean Guzman (71 y.o. Dean Guzman Primary Care Dean Guzman: Dean Guzman Other Clinician: Referring Dean Guzman: Treating Dean Guzman/Extender: [name], Dean Guzman Weeks in Treatment: 0 Visit Information Patient Arrived: Ambulatory Arrival Time: 09:55 Accompanied By: self Transfer Assistance: None Patient Identification Verified: Yes Secondary Verification Process Completed: Yes Patient Requires Transmission-Based Precautions: No Patient Has Alerts: Yes Patient Alerts: Patient on Blood Thinner Electronic Signature(s) Signed: 01/11/2022 4:22:40 PM By: Deon Pilling RN, BSN Entered By: Deon Pilling on 01/11/2022 09:55:51 -------------------------------------------------------------------------------- Clinic Level of Care Assessment Details Patient Name: Date of Service: Dean Guzman, Dean Guzman 01/11/2022 9:45 A M Medical Record Number: [id number] Patient Account Number: [id number] Date of Birth/Sex: Treating RN: [birth year] (71 y.o. Dean Guzman Primary Care  Dean Guzman: Dean Guzman Other Clinician: Referring Charlen Bakula: Treating Hila Bolding/Extender: [name], Dean Guzman Weeks in Treatment: 0 Clinic Level of Care Assessment Items TOOL 2 Quantity Score X- 1 0 Use when only an EandM is performed on the INITIAL visit ASSESSMENTS - Nursing Assessment / Reassessment X- 1 20 General Physical Exam (combine w/ comprehensive assessment (listed just below) when performed on new pt. evals) X- 1 25 Comprehensive Assessment (HX, ROS, Risk Assessments, Wounds Hx, etc.) ASSESSMENTS - Wound and Skin A ssessment / Reassessment '[]'$  - 0 Simple Wound Assessment / Reassessment - one wound '[]'$  - 0 Complex Wound Assessment / Reassessment - multiple wounds '[]'$  - 0 Dermatologic / Skin Assessment (not related to wound area) ASSESSMENTS - Ostomy and/or Continence Assessment and Care '[]'$  - 0 Incontinence Assessment and Management '[]'$  - 0 Ostomy Care Assessment and Management (repouching, etc.) PROCESS - Coordination of Care X - Simple Patient / Family Education for ongoing care 1 15 '[]'$  - 0 Complex (extensive) Patient / Family Education for ongoing care X- 1 10 Staff obtains Programmer, systems, Records, T Results / Process Orders est '[]'$  - 0 Staff telephones HHA, Nursing Homes / Clarify orders / etc '[]'$  - 0 Routine Transfer to another Facility (non-emergent condition) '[]'$  - 0 Routine Hospital Admission (non-emergent condition) '[]'$  - 0 New Admissions / Biomedical engineer / Ordering NPWT Apligraf, etc. , '[]'$  - 0 Emergency Hospital Admission (emergent condition) X- 1 10 Simple Discharge Coordination '[]'$  - 0 Complex (extensive) Discharge Coordination PROCESS - Special Needs '[]'$  - 0 Pediatric / Minor Patient Management '[]'$  - 0 Isolation Patient Management '[]'$  - 0 Hearing / Language / Visual special needs '[]'$  - 0 Assessment of Community assistance (transportation, D/C planning, etc.) '[]'$  - 0 Additional assistance / Altered mentation '[]'$  - 0 Support  Surface(s) Assessment (bed, cushion, seat, etc.) INTERVENTIONS - Wound Cleansing / Measurement '[]'$  - 0 Wound Imaging (photographs - any number of wounds) '[]'$  - 0 Wound Tracing (instead of photographs) '[]'$  - 0 Simple Wound Measurement - one wound '[]'$  - 0 Complex Wound Measurement - multiple wounds '[]'$  -  0 Simple Wound Cleansing - one wound '[]'$  - 0 Complex Wound Cleansing - multiple wounds INTERVENTIONS - Wound Dressings '[]'$  - 0 Small Wound Dressing one or multiple wounds '[]'$  - 0 Medium Wound Dressing one or multiple wounds '[]'$  - 0 Large Wound Dressing one or multiple wounds '[]'$  - 0 Application of Medications - injection INTERVENTIONS - Miscellaneous '[]'$  - 0 External ear exam '[]'$  - 0 Specimen Collection (cultures, biopsies, blood, body fluids, etc.) '[]'$  - 0 Specimen(s) / Culture(s) sent or taken to Lab for analysis '[]'$  - 0 Patient Transfer (multiple staff / Civil Service fast streamer / Similar devices) '[]'$  - 0 Simple Staple / Suture removal (25 or less) '[]'$  - 0 Complex Staple / Suture removal (26 or more) '[]'$  - 0 Hypo / Hyperglycemic Management (close monitor of Blood Glucose) '[]'$  - 0 Ankle / Brachial Index (ABI) - do not check if billed separately Has the patient been seen at the hospital within the last three years: Yes Total Score: 80 Level Of Care: New/Established - Level 3 Electronic Signature(s) Signed: 01/11/2022 4:22:40 PM By: Deon Pilling RN, BSN Entered By: Deon Pilling on 01/11/2022 10:15:33 -------------------------------------------------------------------------------- Encounter Discharge Information Details Patient Name: Date of Service: Dean Guzman, Dean Guzman 01/11/2022 9:45 A M Medical Record Number: [id number] Patient Account Number: [id number] Date of Birth/Sex: Treating RN: [birth year] (71 y.o. Dean Guzman Primary Care Evann Erazo: Dean Guzman Other Clinician: Referring Shaquil Aldana: Treating Aeris Hersman/Extender: [name], Dean Guzman Weeks in Treatment: 0 Encounter  Discharge Information Items Discharge Condition: Stable Ambulatory Status: Ambulatory Discharge Destination: Home Transportation: Private Auto Accompanied By: self Schedule Follow-up Appointment: No Clinical Summary of Care: Notes patient to see if hyberbaric is approved by insurance and cost out of pocket before chest x-ray and cardiac clearance by cardiologist. Electronic Signature(s) Signed: 01/11/2022 4:22:40 PM By: Deon Pilling RN, BSN Entered By: Deon Pilling on 01/11/2022 10:26:51 -------------------------------------------------------------------------------- Lower Extremity Assessment Details Patient Name: Date of Service: Dean Guzman 01/11/2022 9:45 A M Medical Record Number: [id number] Patient Account Number: [id number] Date of Birth/Sex: Treating RN: [birth year] (71 y.o. Dean Guzman Primary Care Maansi Wike: Dean Guzman Other Clinician: Referring Irwin Toran: Treating Sereniti Wan/Extender: [name], Dean Guzman Weeks in Treatment: 0 Electronic Signature(s) Signed: 01/11/2022 4:22:40 PM By: Deon Pilling RN, BSN Entered By: Deon Pilling on 01/11/2022 09:57:16 -------------------------------------------------------------------------------- Multi Wound Chart Details Patient Name: Date of Service: Dean Guzman, Dean Guzman 01/11/2022 9:45 A M Medical Record Number: [id number] Patient Account Number: [id number] Date of Birth/Sex: Treating RN: [birth year] (71 y.o. Dean Guzman Primary Care Naman Spychalski: Dean Guzman Other Clinician: Referring Connell Bognar: Treating Lilliona Blakeney/Extender: [name], Dean Guzman Weeks in Treatment: 0 Vital Signs Height(in): 74 Pulse(bpm): 60 Weight(lbs): 230 Blood Pressure(mmHg): 159/82 Body Mass Index(BMI): 29.5 Temperature(F): 98.6 Respiratory Rate(breaths/min): 20 Wound Assessments Treatment Notes Electronic Signature(s) Signed: 01/11/2022 4:20:15 PM By: [name] DO Signed: 01/11/2022 4:22:40 PM By:  Deon Pilling RN, BSN Entered By: [name] on 01/11/2022 10:59:21 -------------------------------------------------------------------------------- Multi-Disciplinary Care Plan Details Patient Name: Date of Service: Dean Guzman, Dean Guzman 01/11/2022 9:45 A M Medical Record Number: [id number] Patient Account Number: [id number] Date of Birth/Sex: Treating RN: Guzman[birth year] (71 y.o. Dean Guzman Primary Care Mekhia Brogan: Dean Guzman Other Clinician: Referring Burgandy Hackworth: Treating Estell Dillinger/Extender: [name], Dean Guzman Weeks in Treatment: 0 Active Inactive Electronic Signature(s) Signed: 01/25/2022 8:40:58 AM By: Deon Pilling RN, BSN Previous Signature: 01/11/2022 4:22:40 PM Version By: Deon Pilling RN, BSN Entered By: Deon Pilling on 01/25/2022 08:40:57 -------------------------------------------------------------------------------- Pain Assessment Details Patient Name: Date of  Service: Dean Guzman, Dean Guzman 01/11/2022 9:45 A M Medical Record Number: [id number] Patient Account Number: [id number] Date of Birth/Sex: Treating RN: [birth year]zman (71 y.o. Dean Guzman Primary Care Ceceilia Cephus: Dean Guzman Other Clinician: Referring Jillayne Witte: Treating Dameir Gentzler/Extender: [name], Dean Guzman Weeks in Treatment: 0 Active Problems Location of Pain Severity and Description of Pain Patient Has Paino No Site Locations Rate the pain. Current Pain Level: 0 Pain Management and Medication Current Pain Management: Medication: No Cold Application: No Rest: No Massage: No Activity: No T.E.N.S.: No Heat Application: No Leg drop or elevation: No Is the Current Pain Management Adequate: Adequate How does your wound impact your activities of daily livingo Sleep: No Bathing: No Appetite: No Relationship With Others: No Bladder Continence: No Emotions: No Bowel Continence: No Work: No Toileting: No Drive: No Dressing: No Hobbies: No Metallurgist) Signed: 01/11/2022 4:22:40 PM By: Deon Pilling RN, BSN Entered By: Deon Pilling on 01/11/2022 09:57:24 -------------------------------------------------------------------------------- Patient/Caregiver Education Details Patient Name: Date of Service: Dean Guzman 8/15/2023andnbsp9:45 A M Medical Record Number: [id number] Patient Account Number: [id number] Date of Birth/Gender: Treating RN: Guzman[birth year] (71 y.o. Dean Guzman Primary Care Physician: Dean Guzman Other Clinician: Referring Physician: Treating Physician/Extender: [name], Dean Guzman Weeks in Treatment: 0 Education Assessment Education Provided To: Patient Education Topics Provided Hyperbaric Oxygenation: Handouts: Hyperbaric Oxygen Methods: Explain/Verbal, Printed Responses: Reinforcements needed [place]: o Handouts: Welcome T [facility] [place] o Methods: Explain/Verbal, Printed Responses: Reinforcements needed Electronic Signature(s) Signed: 01/11/2022 4:22:40 PM By: Deon Pilling RN, BSN Entered By: Deon Pilling on 01/11/2022 10:15:09 -------------------------------------------------------------------------------- Vitals Details Patient Name: Date of Service: Dean Guzman, Dean Guzman 01/11/2022 9:45 A M Medical Record Number: [id number] Patient Account Number: [id number] Date of Birth/Sex: Treating RN: Guzman[birth year] (71 y.o. Dean Guzman Primary Care Marria Mathison: Dean Guzman Other Clinician: Referring Ermalinda Joubert: Treating Elynor Kallenberger/Extender: [name], Dean Guzman Weeks in Treatment: 0 Vital Signs Time Taken: 09:55 Temperature (F): 98.6 Height (in): 74 Pulse (bpm): 60 Source: Stated Respiratory Rate (breaths/min): 20 Weight (lbs): 230 Blood Pressure (mmHg): 159/82 Source: Stated Reference Range: 80 - 120 mg / dl Body Mass Index (BMI): 29.5 Electronic Signature(s) Signed: 01/11/2022 4:22:40 PM By: Deon Pilling RN,  BSN Entered By: Deon Pilling on 01/11/2022 10:01:03

## 2022-01-12 NOTE — Progress Notes (Signed)
Dean Guzman ([id number]) Visit Report for 01/11/2022 Chief Complaint Document Details Patient Name: Date of Service: Dean Guzman, Dean Guzman 01/11/2022 9:45 A M Medical Record Number: [id number] Patient Account Number: [id number] Date of Birth/Sex: Treating RN: 1951/01/13 (71 y.o. Dean Guzman Primary Care Provider: Riki Sheer Other Clinician: Referring Provider: Treating Provider/Extender: [name], [name] Weeks in Treatment: 0 Information Obtained from: Patient Chief Complaint 01/11/2022; patient presents for HBO evaluation in the setting of radiation cystitis Electronic Signature(s) Signed: 01/11/2022 4:20:15 PM By: [name] DO Entered By: [name] on 01/11/2022 11:00:50 -------------------------------------------------------------------------------- HPI Details Patient Name: Date of Service: Dean Guzman, Dean Guzman 01/11/2022 9:45 A M Medical Record Number: [id number] Patient Account Number: [id number] Date of Birth/Sex: Treating RN: Dec 15, 1950 (71 y.o. Dean Guzman Primary Care Provider: Riki Sheer Other Clinician: Referring Provider: Treating Provider/Extender: [name], [name] Weeks in Treatment: 0 History of Present Illness HPI Description: Admission 01/11/2022 Mr. Dean Guzman is a 70 year old retired male with a past medical history of prostate cancer status post radioactive I-125 brachytherapy; 145Gy as well as erry space OAR insertion on 01/13/20. His urologist is Dr. Gloriann Loan and his radiation oncologist is Dr. Tammi Klippel. Over the past couple years patient has had increased urinary frequency, dysuria and nocturia. He has Been treated with tadalafil, Flomax, alfuzosin, Detrol and Bactrim With little benefit. On 12/16/2021 patient had a cystoscopy that showed erythema and inflammation At the bladder neck consistent with radiation cystitis, prostatitis. He denies hematuria. He would like to proceed with HBO therapy pending  insurance approval. Electronic Signature(s) Signed: 01/11/2022 4:20:15 PM By: [name] DO Entered By: [name] on 01/11/2022 12:27:51 -------------------------------------------------------------------------------- Physical Exam Details Patient Name: Date of Service: Dean Guzman, Dean Guzman 01/11/2022 9:45 A M Medical Record Number: [id number] Patient Account Number: [id number] Date of Birth/Sex: Treating RN: [birth year] (71 y.o. Dean Guzman Primary Care Provider: Riki Sheer Other Clinician: Referring Provider: Treating Provider/Extender: [name], [name] Weeks in Treatment: 0 Constitutional respirations regular, non-labored and within target range for patient.Marland Kitchen Respiratory clear to auscultation bilaterally. Cardiovascular regular rate and rhythm with normal S1, S2. Psychiatric pleasant and cooperative. Electronic Signature(s) Signed: 01/11/2022 4:20:15 PM By: [name] DO Entered By: [name] on 01/11/2022 12:27:56 -------------------------------------------------------------------------------- Physician Orders Details Patient Name: Date of Service: Dean Guzman, Dean Guzman 01/11/2022 9:45 A M Medical Record Number: [id number] Patient Account Number: [id number] Date of Birth/Sex: Treating RN: 04-10-1951 (71 y.o. Dean Guzman Primary Care Provider: Riki Sheer Other Clinician: Referring Provider: Treating Provider/Extender: [name], [name] Weeks in Treatment: 0 Verbal / Phone Orders: No Diagnosis Coding ICD-10 Coding Code Description N30.40 Irradiation cystitis without hematuria Follow-up Appointments Other: - Hyberbaric tech Ronalee Belts or Darrel will call you to set up treatments once insurance has approved. Hyperbaric Oxygen Therapy Evaluate for HBO Therapy Indication: - Radiation Cystitis If appropriate for treatment, begin HBOT per protocol: 2.0 ATA for 90 Minutes with 2 Five (5) Minute A  Breaks ir Total Number of Treatments: - 40 One treatments per day (delivered Monday through Friday unless otherwise specified in Special Instructions below): A frin (Oxymetazoline HCL) 0.05% nasal spray - 1 spray in both nostrils daily as needed prior to HBO treatment for difficulty clearing ears Consults Cardiology - Cardiology clearance related to cardiac history. Dr. Jenne Campus please provide clearance hyperbaric oxygen therapy for radiation cystitis. #[phone]. Radiology X-ray, Chest - chest x-ray related to history of hypertension and for hyberbaric protocol. ICD I10. CPT code [phone] - (ICD10 N30.40 -  Irradiation cystitis without hematuria) Electronic Signature(s) Signed: 01/11/2022 4:20:15 PM By: [name] DO Entered By: [name] on 01/11/2022 12:28:04 Prescription 01/11/2022 -------------------------------------------------------------------------------- [name] DO Patient Name: Provider: [birth year] [id number] Date of Birth: NPI#: [name] [id number] Sex: DEA #: [phone] Phone #: License #: [place] Patient Address: [place] [address] [place], [place] [address] [place], [place] [address] [phone] Allergies No Known Drug Allergies Provider's Orders X-ray, Chest - ICD10: N30.40 - chest x-ray related to history of hypertension and for hyberbaric protocol. ICD I10. CPT code [phone] Hand Signature: Date(s): Prescription 01/11/2022 [name] DO Patient Name: Provider: September 15, 1950 [id number] Date of Birth: NPI#: [name] [id number] Sex: DEA #: [phone] Phone #: License #: [place] Patient Address: [address], [place] [address] [place], [place] [address] [phone] Allergies No Known Drug Allergies Provider's Orders Cardiology -  Cardiology clearance related to cardiac history. Dr. Jenne Campus please provide clearance hyperbaric oxygen therapy for radiation cystitis. #[phone]. Hand Signature: Date(s): Electronic Signature(s) Signed: 01/11/2022 4:20:15 PM By: [name] DO Entered By: [name] on 01/11/2022 12:28:04 -------------------------------------------------------------------------------- Problem List Details Patient Name: Date of Service: Dean Guzman, Dean Guzman 01/11/2022 9:45 A M Medical Record Number: [id number] Patient Account Number: [id number] Date of Birth/Sex: Treating RN: November 27, 1950 (71 y.o. Dean Guzman Primary Care Provider: Riki Sheer Other Clinician: Referring Provider: Treating Provider/Extender: [name], [name] Weeks in Treatment: 0 Active Problems ICD-10 Encounter Code Description Active Date MDM Diagnosis N30.40 Irradiation cystitis without hematuria 01/11/2022 No Yes C61 Malignant neoplasm of prostate 01/11/2022 No Yes I25.10 Atherosclerotic heart disease of native coronary artery without angina pectoris 01/11/2022 No Yes Inactive Problems Resolved Problems Electronic Signature(s) Signed: 01/11/2022 4:20:15 PM By: [name] DO Entered By: [name] on 01/11/2022 10:59:14 -------------------------------------------------------------------------------- Progress Note Details Patient Name: Date of Service: Dean Guzman, Dean Guzman 01/11/2022 9:45 A M Medical Record Number: [id number] Patient Account Number: [id number] Date of Birth/Sex: Treating RN: [birth year] (71 y.o. Dean Guzman Primary Care Provider: Riki Sheer Other Clinician: Referring Provider: Treating Provider/Extender: [name], [name] Weeks in Treatment: 0 Subjective Chief Complaint Information obtained from Patient 01/11/2022; patient presents for HBO evaluation in the setting of radiation cystitis History of Present Illness  (HPI) Admission 01/11/2022 Mr. EMELIO SCHNELLER is a 71 year old retired male with a past medical history of prostate cancer status post radioactive I-125 brachytherapy; 145Gy as well as erry space OAR insertion on 01/13/20. His urologist is Dr. Gloriann Loan and his radiation oncologist is Dr. Tammi Klippel. Over the past couple years patient has had increased urinary frequency, dysuria and nocturia. He has Been treated with tadalafil, Flomax, alfuzosin, Detrol and Bactrim With little benefit. On 12/16/2021 patient had a cystoscopy that showed erythema and inflammation At the bladder neck consistent with radiation cystitis, prostatitis. He denies hematuria. He would like to proceed with HBO therapy pending insurance approval. Patient History Information obtained from Chart. Allergies No Known Drug Allergies Family History Cancer - Siblings, Diabetes - Maternal Grandparents, Heart Disease - Father, Hypertension - Father. Social History Former smoker - quit 1980, Marital Status - Married, Alcohol Use - Never, Drug Use - No History, Caffeine Use - Daily - daily. Medical History Cardiovascular Patient has history of Coronary Artery Disease - with stenting 08/2021, Hypertension Oncologic Patient has history of Received Radiation - radiation proctitis Hospitalization/Surgery History - 01/13/2020  cystoscopy. - 01/13/2020 radioactive seen implant. - 08/2021 cardiac stents. Medical A Surgical History Notes nd Constitutional Symptoms (General Health) migraines Hematologic/Lymphatic factor V leiden mutation Cardiovascular hyperlipidemia paroxysmal ventricular tachycardia Gastrointestinal barrett's esophagus GERD Genitourinary BPH Integumentary (Skin) Kidney Stones Oncologic prostate Ca Review of Systems (ROS) Eyes Complains or has symptoms of Glasses / Contacts. Objective Constitutional respirations regular, non-labored and within target range for patient.. Vitals Time Taken: 9:55 AM, Height: 74 in, Source:  Stated, Weight: 230 lbs, Source: Stated, BMI: 29.5, Temperature: 98.6 F, Pulse: 60 bpm, Respiratory Rate: 20 breaths/min, Blood Pressure: 159/82 mmHg. Respiratory clear to auscultation bilaterally. Cardiovascular regular rate and rhythm with normal S1, S2. Psychiatric pleasant and cooperative. Assessment Active Problems ICD-10 Irradiation cystitis without hematuria Malignant neoplasm of prostate Atherosclerotic heart disease of native coronary artery without angina pectoris Patient presents for HBO evaluation in the setting of radiation cystitis. He has a cystoscopy report on 12/16/2021 that confirms radiation changes to the bladder consistent with radiation cystitis. He has a history of prostate brachytherapy in 2021 as treatment for prostate adenocarcinoma. I think he would benefit from HBO therapy as he has symptoms of dysuria, increased frequency and nocturia. The goal would be to see improvement in the symptoms. He denies hematuria. He does have a history of cardiac stents and we will ask for cardiac clearance from his cardiologist. He will also need to obtain a chest x- ray prior to starting HBO. Risks and benefits of HBO were discussed with the patient. He would like to proceed. 46 minutes was spent on the encounter including face-to-face, EMR review and coordination of care Plan Follow-up Appointments: Other: - Darling or Darrel will call you to set up treatments once insurance has approved. Hyperbaric Oxygen Therapy: Evaluate for HBO Therapy Indication: - Radiation Cystitis If appropriate for treatment, begin HBOT per protocol: 2.0 ATA for 90 Minutes with 2 Five (5) Minute Air Breaks T Number of Treatments: - 40 otal One treatments per day (delivered Monday through Friday unless otherwise specified in Special Instructions below): Afrin (Oxymetazoline HCL) 0.05% nasal spray - 1 spray in both nostrils daily as needed prior to HBO treatment for difficulty clearing  ears Radiology ordered were: X-ray, Chest - chest x-ray related to history of hypertension and for hyberbaric protocol. ICD I10. CPT code [phone] Consults ordered were: Cardiology - Cardiology clearance related to cardiac history. Dr. Jenne Campus please provide clearance hyperbaric oxygen therapy for radiation cystitis. #[phone]. 1. Run insurance approval for HBO therapy 2. Cardiac clearance from cardiologist Electronic Signature(s) Signed: 01/11/2022 4:20:15 PM By: [name] DO Entered By: [name] on 01/11/2022 12:28:40 -------------------------------------------------------------------------------- HxROS Details Patient Name: Date of Service: Dean Guzman, Dean Guzman 01/11/2022 9:45 A M Medical Record Number: [id number] Patient Account Number: [id number] Date of Birth/Sex: Treating RN: [birth year] (71 y.o. Dean Guzman Primary Care Provider: Riki Sheer Other Clinician: Referring Provider: Treating Provider/Extender: [name], [name] Weeks in Treatment: 0 Information Obtained From Chart Eyes Complaints and Symptoms: Positive for: Glasses / Contacts Constitutional Symptoms (General Health) Medical History: Past Medical History Notes: migraines Hematologic/Lymphatic Medical History: Past Medical History Notes: factor V leiden mutation Cardiovascular Medical History: Positive for: Coronary Artery Disease - with stenting 08/2021; Hypertension Past Medical History Notes: hyperlipidemia paroxysmal ventricular tachycardia Gastrointestinal Medical History: Past Medical History Notes: barrett's esophagus GERD Genitourinary Medical History: Past Medical History Notes: BPH Integumentary (Skin) Medical History: Past Medical History Notes: Kidney Stones Oncologic Medical History: Positive for: Received Radiation - radiation proctitis Past Medical  History Notes: prostate Ca Immunizations Pneumococcal Vaccine: Received  Pneumococcal Vaccination: Yes Received Pneumococcal Vaccination On or After 60th Birthday: Yes Implantable Devices No devices added Hospitalization / Surgery History Type of Hospitalization/Surgery 01/13/2020 cystoscopy 01/13/2020 radioactive seen implant 08/2021 cardiac stents Family and Social History Cancer: Yes - Siblings; Diabetes: Yes - Maternal Grandparents; Heart Disease: Yes - Father; Hypertension: Yes - Father; Former smoker - quit 1980; Marital Status - Married; Alcohol Use: Never; Drug Use: No History; Caffeine Use: Daily - daily; Financial Concerns: No; Food, Clothing or Shelter Needs: No; Support System Lacking: No; Transportation Concerns: No Electronic Signature(s) Signed: 01/11/2022 4:20:15 PM By: [name] DO Signed: 01/11/2022 4:22:40 PM By: Deon Pilling RN, BSN Entered By: Deon Pilling on 01/11/2022 10:07:22 -------------------------------------------------------------------------------- SuperBill Details Patient Name: Date of Service: Dean Guzman 01/11/2022 Medical Record Number: [id number] Patient Account Number: [id number] Date of Birth/Sex: Treating RN: 08-19-1950 (71 y.o. Dean Guzman Primary Care Provider: Riki Sheer Other Clinician: Referring Provider: Treating Provider/Extender: [name], [name] Weeks in Treatment: 0 Diagnosis Coding ICD-10 Codes Code Description N30.40 Irradiation cystitis without hematuria C61 Malignant neoplasm of prostate I25.10 Atherosclerotic heart disease of native coronary artery without angina pectoris Facility Procedures CPT4 Code: [id number] Description: 99213 - WOUND CARE VISIT-LEV 3 EST PT Modifier: Quantity: 1 Physician Procedures : CPT4 Code Description Modifier [id number] - WC PHYS LEVEL 4 - NEW PT ICD-10 Diagnosis Description N30.40 Irradiation cystitis without hematuria C61 Malignant neoplasm of prostate I25.10 Atherosclerotic heart disease of native coronary artery  without  angina pectoris Quantity: 1 Electronic Signature(s) Signed: 01/11/2022 4:20:15 PM By: [name] DO Entered By: [name] on 01/11/2022 12:29:01

## 2022-01-17 ENCOUNTER — Emergency Department (HOSPITAL_BASED_OUTPATIENT_CLINIC_OR_DEPARTMENT_OTHER): Payer: PPO

## 2022-01-17 ENCOUNTER — Emergency Department (HOSPITAL_BASED_OUTPATIENT_CLINIC_OR_DEPARTMENT_OTHER)
Admission: EM | Admit: 2022-01-17 | Discharge: 2022-01-17 | Disposition: A | Discharge status: Home / Self Care | Payer: PPO | Attending: Emergency Medicine | Admitting: Emergency Medicine

## 2022-01-17 ENCOUNTER — Encounter (HOSPITAL_BASED_OUTPATIENT_CLINIC_OR_DEPARTMENT_OTHER): Payer: Self-pay | Admitting: Emergency Medicine

## 2022-01-17 ENCOUNTER — Telehealth: Payer: Self-pay | Admitting: Cardiology

## 2022-01-17 ENCOUNTER — Other Ambulatory Visit: Payer: Self-pay

## 2022-01-17 ENCOUNTER — Telehealth: Payer: Self-pay | Admitting: Family Medicine

## 2022-01-17 DIAGNOSIS — J189 Pneumonia, unspecified organism: Secondary | ICD-10-CM | POA: Diagnosis not present

## 2022-01-17 DIAGNOSIS — R112 Nausea with vomiting, unspecified: Secondary | ICD-10-CM | POA: Diagnosis not present

## 2022-01-17 DIAGNOSIS — Z79899 Other long term (current) drug therapy: Secondary | ICD-10-CM | POA: Diagnosis not present

## 2022-01-17 DIAGNOSIS — Z87891 Personal history of nicotine dependence: Secondary | ICD-10-CM | POA: Insufficient documentation

## 2022-01-17 DIAGNOSIS — R944 Abnormal results of kidney function studies: Secondary | ICD-10-CM | POA: Insufficient documentation

## 2022-01-17 DIAGNOSIS — R0789 Other chest pain: Secondary | ICD-10-CM | POA: Diagnosis not present

## 2022-01-17 DIAGNOSIS — R109 Unspecified abdominal pain: Secondary | ICD-10-CM | POA: Diagnosis not present

## 2022-01-17 DIAGNOSIS — Z8546 Personal history of malignant neoplasm of prostate: Secondary | ICD-10-CM | POA: Diagnosis not present

## 2022-01-17 DIAGNOSIS — D72829 Elevated white blood cell count, unspecified: Secondary | ICD-10-CM | POA: Diagnosis not present

## 2022-01-17 DIAGNOSIS — Z7982 Long term (current) use of aspirin: Secondary | ICD-10-CM | POA: Insufficient documentation

## 2022-01-17 DIAGNOSIS — R1013 Epigastric pain: Secondary | ICD-10-CM | POA: Diagnosis not present

## 2022-01-17 DIAGNOSIS — R079 Chest pain, unspecified: Secondary | ICD-10-CM | POA: Diagnosis not present

## 2022-01-17 DIAGNOSIS — I10 Essential (primary) hypertension: Secondary | ICD-10-CM | POA: Diagnosis not present

## 2022-01-17 DIAGNOSIS — I700 Atherosclerosis of aorta: Secondary | ICD-10-CM | POA: Diagnosis not present

## 2022-01-17 LAB — COMPREHENSIVE METABOLIC PANEL
ALT: 26 U/L (ref 0–44)
AST: 24 U/L (ref 15–41)
Albumin: 4.1 g/dL (ref 3.5–5.0)
Alkaline Phosphatase: 50 U/L (ref 38–126)
Anion gap: 8 (ref 5–15)
BUN: 22 mg/dL (ref 8–23)
CO2: 26 mmol/L (ref 22–32)
Calcium: 9.1 mg/dL (ref 8.9–10.3)
Chloride: 102 mmol/L (ref 98–111)
Creatinine, Ser: 1.28 mg/dL — ABNORMAL HIGH (ref 0.61–1.24)
GFR, Estimated: 60 mL/min — ABNORMAL LOW (ref >60)
Glucose, Bld: 121 mg/dL — ABNORMAL HIGH (ref 70–99)
Potassium: 3.6 mmol/L (ref 3.5–5.1)
Sodium: 136 mmol/L (ref 135–145)
Total Bilirubin: 0.9 mg/dL (ref 0.3–1.2)
Total Protein: 7.1 g/dL (ref 6.5–8.1)

## 2022-01-17 LAB — CBC
HCT: 44.2 % (ref 39.0–52.0)
Hemoglobin: 15.2 g/dL (ref 13.0–17.0)
MCH: 30.5 pg (ref 26.0–34.0)
MCHC: 34.4 g/dL (ref 30.0–36.0)
MCV: 88.8 fL (ref 80.0–100.0)
Platelets: 150 10*3/uL (ref 150–400)
RBC: 4.98 MIL/uL (ref 4.22–5.81)
RDW: 13.2 % (ref 11.5–15.5)
WBC: 12.3 10*3/uL — ABNORMAL HIGH (ref 4.0–10.5)
nRBC: 0 % (ref 0.0–0.2)

## 2022-01-17 LAB — TROPONIN I (HIGH SENSITIVITY)
Troponin I (High Sensitivity): 10 ng/L (ref <18)
Troponin I (High Sensitivity): 10 ng/L (ref <18)

## 2022-01-17 LAB — LIPASE, BLOOD: Lipase: 43 U/L (ref 11–51)

## 2022-01-17 MED ORDER — ALUM & MAG HYDROXIDE-SIMETH 200-200-20 MG/5ML PO SUSP
30.0000 mL | Freq: Once | ORAL | Status: AC
  Administered 2022-01-17: 30 mL via ORAL
  Filled 2022-01-17: qty 30

## 2022-01-17 MED ORDER — AZITHROMYCIN 250 MG PO TABS: 250.0000 mg | ORAL_TABLET | Freq: Every day | ORAL | 0 refills | Status: DC

## 2022-01-17 MED ORDER — LIDOCAINE VISCOUS HCL 2 % MT SOLN
15.0000 mL | Freq: Once | OROMUCOSAL | Status: AC
  Administered 2022-01-17: 15 mL via ORAL
  Filled 2022-01-17: qty 15

## 2022-01-17 MED ORDER — IOHEXOL 300 MG/ML  SOLN
100.0000 mL | Freq: Once | INTRAMUSCULAR | Status: AC | PRN
  Administered 2022-01-17: 100 mL via INTRAVENOUS

## 2022-01-17 MED ORDER — ONDANSETRON HCL 4 MG/2ML IJ SOLN
4.0000 mg | Freq: Once | INTRAMUSCULAR | Status: AC
Start: 2022-01-17 — End: 2022-01-17
  Administered 2022-01-17: 4 mg via INTRAVENOUS
  Filled 2022-01-17: qty 2

## 2022-01-17 MED ORDER — SUCRALFATE 1 G PO TABS: 1.0000 g | ORAL_TABLET | Freq: Three times a day (TID) | ORAL | 0 refills | Status: DC

## 2022-01-17 MED ORDER — SODIUM CHLORIDE 0.9 % IV BOLUS
1000.0000 mL | Freq: Once | INTRAVENOUS | Status: AC
  Administered 2022-01-17: 1000 mL via INTRAVENOUS

## 2022-01-17 MED ORDER — ONDANSETRON HCL 4 MG PO TABS: 4.0000 mg | ORAL_TABLET | ORAL | 0 refills | Status: DC | PRN

## 2022-01-17 MED ORDER — AMOXICILLIN-POT CLAVULANATE 875-125 MG PO TABS: 1.0000 | ORAL_TABLET | Freq: Two times a day (BID) | ORAL | 0 refills | Status: DC

## 2022-01-17 MED ORDER — FAMOTIDINE IN NACL 20-0.9 MG/50ML-% IV SOLN
20.0000 mg | Freq: Once | INTRAVENOUS | Status: AC
Start: 2022-01-17 — End: 2022-01-17
  Administered 2022-01-17: 20 mg via INTRAVENOUS
  Filled 2022-01-17: qty 50

## 2022-01-17 NOTE — Telephone Encounter (Signed)
Spoke with Dawn per DPR who states she is taking pt to the ED as he has requested. Pt has not missed any other doses of medication and advised once he is seen he maybe able to take medications later. Dawn verbalized understanding and had no additional questions.

## 2022-01-17 NOTE — Telephone Encounter (Signed)
I can see him around 1045-50 for an E visit. Otherwise ginger ale can be helpful, small amounts of food/fluids.

## 2022-01-17 NOTE — Telephone Encounter (Signed)
Dawn (spouse) called stating that the pt is having nausea and vomiting and is in a position where he is unable to take his Plavix. Pt would like to know what to do to ensure his medication stays in him.

## 2022-01-17 NOTE — ED Notes (Signed)
ED Provider at bedside. 

## 2022-01-17 NOTE — ED Triage Notes (Signed)
Patient presents to ED via POV from home. Here with chest pain that began this morning. Report it woke him from his sleep. Reports nausea and vomiting. Cardiac stent placement 4 months ago.

## 2022-01-17 NOTE — Discharge Instructions (Addendum)
It was a pleasure caring for you today in the emergency department.  Please return to the emergency department for any worsening or worrisome symptoms.   We believe that your symptoms are caused today by pneumonia, an infection in your lung(s).  Fortunately you should start to improve quickly after taking your antibiotics.  Please take the full course of antibiotics as prescribed and drink plenty of fluids.    Follow up with your doctor within 1-2 days.  If you develop any new or worsening symptoms, including but not limited to fever in spite of taking over-the-counter ibuprofen and/or Tylenol, persistent vomiting, worsening shortness of breath, or other symptoms that concern you, please return to the Emergency Department immediately.    Pneumonia Pneumonia is an infection of the lungs.  CAUSES Pneumonia may be caused by bacteria or a virus. Usually, these infections are caused by breathing infectious particles into the lungs (respiratory tract). SIGNS AND SYMPTOMS  Cough. Fever. Chest pain. Increased rate of breathing. Wheezing. Mucus production. DIAGNOSIS  If you have the common symptoms of pneumonia, your health care provider will typically confirm the diagnosis with a chest X-ray. The X-ray will show an abnormality in the lung (pulmonary infiltrate) if you have pneumonia. Other tests of your blood, urine, or sputum may be done to find the specific cause of your pneumonia. Your health care provider may also do tests (blood gases or pulse oximetry) to see how well your lungs are working. TREATMENT  Some forms of pneumonia may be spread to other people when you cough or sneeze. You may be asked to wear a mask before and during your exam. Pneumonia that is caused by bacteria is treated with antibiotic medicine. Pneumonia that is caused by the influenza virus may be treated with an antiviral medicine. Most other viral infections must run their course. These infections will not respond to  antibiotics.  HOME CARE INSTRUCTIONS  Cough suppressants may be used if you are losing too much rest. However, coughing protects you by clearing your lungs. You should avoid using cough suppressants if you can. Your health care provider may have prescribed medicine if he or she thinks your pneumonia is caused by bacteria or influenza. Finish your medicine even if you start to feel better. Your health care provider may also prescribe an expectorant. This loosens the mucus to be coughed up. Take medicines only as directed by your health care provider. Do not smoke. Smoking is a common cause of bronchitis and can contribute to pneumonia. If you are a smoker and continue to smoke, your cough may last several weeks after your pneumonia has cleared. A cold steam vaporizer or humidifier in your room or home may help loosen mucus. Coughing is often worse at night. Sleeping in a semi-upright position in a recliner or using a couple pillows under your head will help with this. Get rest as you feel it is needed. Your body will usually let you know when you need to rest. PREVENTION A pneumococcal shot (vaccine) is available to prevent a common bacterial cause of pneumonia. This is usually suggested for: People over 65 years old. Patients on chemotherapy. People with chronic lung problems, such as bronchitis or emphysema. People with immune system problems. If you are over 65 or have a high risk condition, you may receive the pneumococcal vaccine if you have not received it before. In some countries, a routine influenza vaccine is also recommended. This vaccine can help prevent some cases of pneumonia. You may be   offered the influenza vaccine as part of your care. If you smoke, it is time to quit. You may receive instructions on how to stop smoking. Your health care provider can provide medicines and counseling to help you quit. SEEK MEDICAL CARE IF: You have a fever. SEEK IMMEDIATE MEDICAL CARE IF:  Your  illness becomes worse. This is especially true if you are elderly or weakened from any other disease. You cannot control your cough with suppressants and are losing sleep. You begin coughing up blood. You develop pain which is getting worse or is uncontrolled with medicines. Any of the symptoms which initially brought you in for treatment are getting worse rather than better. You develop shortness of breath or chest pain. MAKE SURE YOU:  Understand these instructions. Will watch your condition. Will get help right away if you are not doing well or get worse. Document Released: 05/16/2005 Document Revised: 09/30/2013 Document Reviewed: 08/05/2010 [employer] Patient Information 2015 [employer]. This information is not intended to replace advice given to you by your health care provider. Make sure you discuss any questions you have with your health care provider.    

## 2022-01-17 NOTE — ED Notes (Signed)
Reviewed discharge instructions and follow up care with pt and wife. Pt states understanding. Pt ambulatory upon discharge with family

## 2022-01-17 NOTE — Telephone Encounter (Signed)
Wife called stating patient has a rough night and woke up vomiting and with knee pain. He hasn't taken his Plavix and aspirin yet because he is afraid he will throw it back up.  Please advised.

## 2022-01-17 NOTE — ED Provider Notes (Signed)
[facility] Provider Note   CSN: [id number] Arrival date & time: 01/17/22  1027     History {Add pertinent medical, surgical, social history, OB history to HPI:1} Chief Complaint  Patient presents with   Chest Pain    Dean Guzman is a 71 y.o. male.  Patient as above with significant medical history as below, including *** who presents to the ED with complaint of ***     Past Medical History:  Diagnosis Date   Barrett's esophagus    BPH (benign prostatic hyperplasia)    Colon polyps    Complication of anesthesia    during biopsy, felt hot and sweating   FH: factor V Leiden mutation    niece died of leiden factor five complications, sister has   Frequent headaches    GERD (gastroesophageal reflux disease)    History of kidney stones    Hyperlipidemia    Hypertension    Migraines     no recent migraines had visual aura   Prostate cancer [facility])     Past Surgical History:  Procedure Laterality Date   colonscopy     CORONARY ANGIOGRAPHY N/A 09/08/2021   Procedure: CORONARY ANGIOGRAPHY;  Surgeon: Sherren Mocha, MD;  Location: [place] [facility];  Service: Cardiovascular;  Laterality: N/A;   CORONARY STENT INTERVENTION N/A 09/08/2021   Procedure: CORONARY STENT INTERVENTION;  Surgeon: Sherren Mocha, MD;  Location: [place] [facility];  Service: Cardiovascular;  Laterality: N/A;   CYSTOSCOPY N/A 01/13/2020   Procedure: CYSTOSCOPY FLEXIBLE;  Surgeon: Lucas Mallow, MD;  Location: [facility];  Service: Urology;  Laterality: N/A;  NO SEEDS FOUND IN BLADDER   INTRAVASCULAR PRESSURE WIRE/FFR STUDY N/A 09/08/2021   Procedure: INTRAVASCULAR PRESSURE WIRE/FFR STUDY;  Surgeon: Sherren Mocha, MD;  Location: [place] [facility];  Service: Cardiovascular;  Laterality: N/A;   MASS EXCISION Left 02/08/2021   Procedure: EXCISION MASS OF MUCOID CYST WITH DISTAL INTERPHANGEAL JOINT ARTHROTOMY LEFT MIDDLE FINGER;  Surgeon: [name],  MD;  Location: [place];  Service: Orthopedics;  Laterality: Left;   NASAL ENDOSCOPY     PROSTATE BIOPSY  may 2021 and 2020   RADIOACTIVE SEED IMPLANT N/A 01/13/2020   Procedure: RADIOACTIVE SEED IMPLANT/BRACHYTHERAPY IMPLANT;  Surgeon: Lucas Mallow, MD;  Location: [place];  Service: Urology;  Laterality: N/A;    73 SEEDS IMPLANTED   SPACE OAR INSTILLATION N/A 01/13/2020   Procedure: SPACE OAR INSTILLATION;  Surgeon: Lucas Mallow, MD;  Location: [facility];  Service: Urology;  Laterality: N/A;     The history is provided by the patient and the spouse. No language interpreter was used.  Chest Pain      Home Medications Prior to Admission medications   Medication Sig Start Date End Date Taking? Authorizing Provider  amLODipine (NORVASC) 5 MG tablet Take 1 tablet by mouth daily. Patient taking differently: Take 5 mg by mouth daily. 09/24/21   Shelda Pal, DO  aspirin 81 MG EC tablet Take 1 tablet (81 mg total) by mouth daily. 09/08/21 03/27/22  Margie Billet, NP  cholecalciferol (VITAMIN D3) 25 MCG (1000 UT) tablet Take 1,000 Units by mouth daily.    [provider]  clopidogrel (PLAVIX) 75 MG tablet Take 1 tablet (75 mg total) by mouth daily. 10/26/21   Park Liter, MD  diltiazem (DILACOR XR) 240 MG 24 hr capsule Take 1 capsule by mouth daily. Patient taking differently:  Take 240 mg by mouth daily. Take 1 capsule by mouth daily. 09/24/21   Shelda Pal, DO  ipratropium (ATROVENT) 0.06 % nasal spray Instill 2 sprays into each nostril three times daily as needed for rhinitis. Patient taking differently: Place 2 sprays into both nostrils as needed for rhinitis. 07/26/21   Shelda Pal, DO  lisinopril (ZESTRIL) 10 MG tablet Take 1 tablet (10 mg total) by mouth daily. 10/26/21 10/21/22  Park Liter, MD  nitroGLYCERIN (NITROSTAT) 0.4 MG SL tablet Place 1 tablet (0.4 mg total) under the  tongue every 5 (five) minutes as needed for chest pain. Do not use with tadalafil and sildenafil 09/08/21 09/08/22  Margie Billet, NP  Omega-3 Fatty Acids (FISH OIL) 1200 MG CAPS Take 1,200 mg by mouth 2 (two) times daily.    [provider]  pantoprazole (PROTONIX) 40 MG tablet Take 40 mg by mouth daily.    [provider]  rosuvastatin (CRESTOR) 40 MG tablet Take 1 tablet (40 mg total) by mouth daily. 10/26/21 10/26/22  Park Liter, MD  Vibegron (GEMTESA) 75 MG TABS Take 75 mg by mouth daily.    [provider]      Allergies    Patient has no known allergies.    Review of Systems   Review of Systems  Cardiovascular:  Positive for chest pain.    Physical Exam Updated Vital Signs BP 126/69 (BP Location: Right Arm)   Pulse 85   Temp 98.7 F (37.1 C) (Oral)   Resp 18   Ht '6\' 2"'$  (1.88 m)   Wt 107.5 kg   SpO2 95%   BMI 30.43 kg/m  Physical Exam  ED Results / Procedures / Treatments   Labs (all labs ordered are listed, but only abnormal results are displayed) Labs Reviewed  CBC - Abnormal; Notable for the following components:      Result Value   WBC 12.3 (*)    All other components within normal limits  COMPREHENSIVE METABOLIC PANEL - Abnormal; Notable for the following components:   Glucose, Bld 121 (*)    Creatinine, Ser 1.28 (*)    GFR, Estimated 60 (*)    All other components within normal limits  LIPASE, BLOOD  TROPONIN I (HIGH SENSITIVITY)  TROPONIN I (HIGH SENSITIVITY)    EKG None  Radiology CT ABDOMEN PELVIS W CONTRAST  Result Date: 01/17/2022 CLINICAL DATA:  Abdominal pain, acute, nonlocalized EXAM: CT ABDOMEN AND PELVIS WITH CONTRAST TECHNIQUE: Multidetector CT imaging of the abdomen and pelvis was performed using the standard protocol following bolus administration of intravenous contrast. RADIATION DOSE REDUCTION: This exam was performed according to the departmental dose-optimization program which includes automated exposure  control, adjustment of the mA and/or kV according to patient size and/or use of iterative reconstruction technique. CONTRAST:  18m OMNIPAQUE IOHEXOL 300 MG/ML  SOLN COMPARISON:  08/01/2018 FINDINGS: Lower chest: Patchy airspace opacity with left lower lobe suspicious for pneumonia. Heart size is normal. Small-moderate hiatal hernia. Hepatobiliary: No focal liver abnormality is seen. No gallstones, gallbladder wall thickening, or biliary dilatation. Pancreas: Unremarkable. No pancreatic ductal dilatation or surrounding inflammatory changes. Spleen: Normal in size without focal abnormality. Adrenals/Urinary Tract: Adrenal glands are unremarkable. Kidneys are normal, without renal calculi, solid lesion, or hydronephrosis. Bladder is unremarkable. Stomach/Bowel: Small-moderate hiatal hernia. Stomach is otherwise within normal limits. Appendix appears normal (series 2, image 56). Colonic diverticulosis. There is some fecalization of small bowel content within the ileum. No evidence of bowel wall thickening, distention,  or inflammatory changes. Vascular/Lymphatic: Aortic atherosclerosis. No enlarged abdominal or pelvic lymph nodes. Reproductive: Brachytherapy seeds within the prostate. Other: No free fluid. No abdominopelvic fluid collection. No pneumoperitoneum. No abdominal wall hernia. Musculoskeletal: No acute or significant osseous findings. Degenerative changes of both hips. IMPRESSION: 1. Patchy airspace opacity within the left lower lobe suspicious for pneumonia. 2. Fecalization of small bowel content within the ileum suggesting slow transit or developing enteritis. 3. Colonic diverticulosis without evidence of acute diverticulitis. 4. Small-moderate hiatal hernia. 5. Aortic Atherosclerosis (ICD10-I70.0). Electronically Signed   By: Davina Poke D.O.   On: 01/17/2022 12:33   DG Chest Port 1 View  Result Date: 01/17/2022 CLINICAL DATA:  Chest pain EXAM: PORTABLE CHEST 1 VIEW COMPARISON:  12/19/2019  FINDINGS: AP portable radiograph with leads and wires project over the chest. Midline trachea. Normal heart size and mediastinal contours for age. No pleural effusion or pneumothorax. Left mid lung airspace disease. Clear right lung. IMPRESSION: Left midlung airspace disease, favoring pneumonia. This could be confirmed with PA and lateral radiographs after removal of EKG wires and leads. Followup PA and lateral chest X-ray is recommended in 3-4 weeks following trial of antibiotic therapy to ensure resolution and exclude underlying malignancy. Electronically Signed   By: Abigail Miyamoto M.D.   On: 01/17/2022 11:26    Procedures Procedures  {Document cardiac monitor, telemetry assessment procedure when appropriate:1}  Medications Ordered in ED Medications  sodium chloride 0.9 % bolus 1,000 mL (1,000 mLs Intravenous New Bag/Given 01/17/22 1228)  famotidine (PEPCID) IVPB 20 mg premix (20 mg Intravenous New Bag/Given 01/17/22 1231)  alum & mag hydroxide-simeth (MAALOX/MYLANTA) 200-200-20 MG/5ML suspension 30 mL (30 mLs Oral Given 01/17/22 1204)    And  lidocaine (XYLOCAINE) 2 % viscous mouth solution 15 mL (15 mLs Oral Given 01/17/22 1205)  ondansetron (ZOFRAN) injection 4 mg (4 mg Intravenous Given 01/17/22 1228)  iohexol (OMNIPAQUE) 300 MG/ML solution 100 mL (100 mLs Intravenous Contrast Given 01/17/22 1209)    ED Course/ Medical Decision Making/ A&P                           Medical Decision Making Amount and/or Complexity of Data Reviewed Labs: ordered. Radiology: ordered.  Risk OTC drugs. Prescription drug management.   This patient presents to the ED with chief complaint(s) of *** with pertinent past medical history of *** which further complicates the presenting complaint. The complaint involves an extensive differential diagnosis and also carries with it a high risk of complications and morbidity.    The differential diagnosis includes but not limited to ***. Serious etiologies were  considered.   The initial plan is to ***   Additional history obtained: Additional history obtained from {additional history:26846} Records reviewed {records:26847}  Independent labs interpretation:  The following labs were independently interpreted: ***  Independent visualization of imaging: - I independently visualized the following imaging with scope of interpretation limited to determining acute life threatening conditions related to emergency care: ***, which revealed ***  Cardiac monitoring was reviewed and interpreted by myself which shows ***  Treatment and Reassessment: ***  Consultation: - Consulted or discussed management/test interpretation w/ external professional: ***  Consideration for admission or further workup: Admission was considered ***  Social Determinants of health: Social History   Tobacco Use   Smoking status: Former    Packs/day: 1.00    Years: 12.00    Total pack years: 12.00    Types: Cigarettes    Quit  date: 05/30/1978    Years since quitting: 43.6   Smokeless tobacco: Never  Vaping Use   Vaping Use: Never used  Substance Use Topics   Alcohol use: Yes    Comment: rarely   Drug use: Not Currently      {Document critical care time when appropriate:1} {Document review of labs and clinical decision tools ie heart score, Chads2Vasc2 etc:1}  {Document your independent review of radiology images, and any outside records:1} {Document your discussion with family members, caretakers, and with consultants:1} {Document social determinants of health affecting pt's care:1} {Document your decision making why or why not admission, treatments were needed:1} Final Clinical Impression(s) / ED Diagnoses Final diagnoses:  None    Rx / DC Orders ED Discharge Orders     None

## 2022-01-17 NOTE — Telephone Encounter (Signed)
Called and spoke to his wife and they were just pulling into the ED and preferred being seen there for today. Will schedule a follow-up later on with PCP.

## 2022-01-18 ENCOUNTER — Encounter: Payer: Self-pay | Admitting: Family Medicine

## 2022-01-18 ENCOUNTER — Other Ambulatory Visit: Payer: Self-pay | Admitting: Family Medicine

## 2022-01-19 ENCOUNTER — Encounter: Payer: Self-pay | Admitting: Family Medicine

## 2022-01-19 ENCOUNTER — Ambulatory Visit (INDEPENDENT_AMBULATORY_CARE_PROVIDER_SITE_OTHER): Payer: PPO | Admitting: Family Medicine

## 2022-01-19 ENCOUNTER — Encounter: Payer: Self-pay | Admitting: Cardiology

## 2022-01-19 VITALS — BP 132/68 | HR 61 | Temp 98.2°F | Ht 74.0 in | Wt 239.1 lb

## 2022-01-19 DIAGNOSIS — J189 Pneumonia, unspecified organism: Secondary | ICD-10-CM

## 2022-01-19 MED ORDER — PANTOPRAZOLE SODIUM 40 MG PO TBEC: 40.0000 mg | DELAYED_RELEASE_TABLET | Freq: Two times a day (BID) | ORAL | Status: DC

## 2022-01-19 NOTE — Progress Notes (Signed)
Chief Complaint  Patient presents with   Follow-up    ED    Subjective: Patient is a 71 y.o. male here for ED f/u.  Patient was seen at the ED on 01/17/2022 for abdominal pain and nausea.  He had thrown up 2 times prior to going there.  He had a largely unremarkable CT scan of the abdominal region but it did show possible lower lobe pneumonia which was also suspected on chest x-ray.  He had been having reflux issues and wonders if he aspirated on gastric contents.  He was started on a Z-Pak and Augmentin.  He is tolerating these medicines well and reports no adverse effects.  He feels poorly with abdominal and chest discomfort during the evening and in the mornings.  He generally feels better in the mid day.  He has no coughing or difficulty breathing.  He is not having any fevers.  He was having rigors but this has since resolved.  He is taking Protonix 40 mg twice daily.  No bowel changes or current nausea/vomiting.  Past Medical History:  Diagnosis Date   Barrett's esophagus    BPH (benign prostatic hyperplasia)    Colon polyps    Complication of anesthesia    during biopsy, felt hot and sweating   FH: factor V Leiden mutation    niece died of leiden factor five complications, sister has   Frequent headaches    GERD (gastroesophageal reflux disease)    History of kidney stones    Hyperlipidemia    Hypertension    Migraines     no recent migraines had visual aura   Prostate cancer (HCC)     Objective: BP 132/68   Pulse 61   Temp 98.2 F (36.8 C) (Oral)   Ht '6\' 2"'$  (1.88 m)   Wt 239 lb 2 oz (108.5 kg)   SpO2 96%   BMI 30.70 kg/m  General: Awake, appears stated age Heart: RRR Lungs: CTAB, no rales, wheezes or rhonchi. No accessory muscle use Abdomen: Bowel sounds present, soft, TTP in epigastric region, no masses or organomegaly, nondistended Psych: Age appropriate judgment and insight, normal affect and mood  Assessment and Plan: Community acquired pneumonia of left  lower lobe of lung - Plan: DG Chest 2 View  Finish Z-Pak and Augmentin.  Continue Protonix 40 mg twice daily.  Add Pepcid 20 mg daily for the next 2 weeks.  Reflux precautions discussed again.  We will recheck a chest x-ray in 6 weeks.  We will schedule this at the [facility].  I think he likely does have aspiration pneumonia.  Activity as tolerated, he needs to push fluids.  Other than a leukocytosis, labs are either normal or stable in the ER. The patient voiced understanding and agreement to the plan.  I spent 35 min w the pt discussing the above plan in addition to reviewing his chart on the same day of the visit.   [place], DO 01/19/22  11:44 AM

## 2022-01-19 NOTE — Patient Instructions (Addendum)
Follow up for your X-ray at the [facility] in [place] in early October. The order is there, you don't need to schedule an appointment.   Take some Pepcid/famtodine 20 mg daily for the next [redacted] weeks along with your Protonix.   The only lifestyle changes that have data behind them are weight loss for the overweight/obese and elevating the head of the bed. Finding out which foods/positions are triggers is important.  Let us know if you need anything.

## 2022-01-20 ENCOUNTER — Telehealth: Payer: Self-pay

## 2022-01-20 DIAGNOSIS — I472 Ventricular tachycardia, unspecified: Secondary | ICD-10-CM

## 2022-01-20 NOTE — Telephone Encounter (Signed)
-----   Message from Park Liter, MD sent at 01/20/2022  8:35 AM EDT ----- Monitor shows some nonsustained ventricular tachycardia.  Which is still concerning.  The hope was that after intervention and removing ischemia as potential trigger for his arrhythmia things will get better but it did not improve.  Therefore please schedule him to see Dr. Curt Bears for evaluation for ventricular tachycardia

## 2022-01-20 NOTE — Telephone Encounter (Signed)
-----   Message from Dean Liter, MD sent at 01/20/2022  8:35 AM EDT ----- Monitor shows some nonsustained ventricular tachycardia.  Which is still concerning.  The hope was that after intervention and removing ischemia as potential trigger for his arrhythmia things will get better but it did not improve.  Therefore please schedule him to see Dr. Curt Bears for evaluation for ventricular tachycardia

## 2022-01-20 NOTE — Telephone Encounter (Signed)
Patient notified of results and recommendations, he agreed with plan. Referral sent to Dr. Curt Bears team.

## 2022-02-02 DIAGNOSIS — C61 Malignant neoplasm of prostate: Secondary | ICD-10-CM | POA: Diagnosis not present

## 2022-02-04 MED ORDER — IPRATROPIUM BROMIDE 0.06 % NA SOLN: 2.0000 | Freq: Three times a day (TID) | NASAL | 5 refills | Status: DC | PRN

## 2022-02-04 MED ORDER — IPRATROPIUM BROMIDE 0.06 % NA SOLN: 2.0000 | Freq: Three times a day (TID) | NASAL | 3 refills | Status: DC | PRN

## 2022-02-04 NOTE — Addendum Note (Signed)
Addended by: Sharon Seller B on: 02/04/2022 03:12 PM   Modules accepted: Orders

## 2022-02-09 DIAGNOSIS — R350 Frequency of micturition: Secondary | ICD-10-CM | POA: Diagnosis not present

## 2022-02-09 DIAGNOSIS — R300 Dysuria: Secondary | ICD-10-CM | POA: Diagnosis not present

## 2022-02-09 DIAGNOSIS — C61 Malignant neoplasm of prostate: Secondary | ICD-10-CM | POA: Diagnosis not present

## 2022-02-09 DIAGNOSIS — R3915 Urgency of urination: Secondary | ICD-10-CM | POA: Diagnosis not present

## 2022-02-19 ENCOUNTER — Encounter: Payer: Self-pay | Admitting: Cardiology

## 2022-02-22 ENCOUNTER — Other Ambulatory Visit: Payer: Self-pay | Admitting: Family Medicine

## 2022-02-28 ENCOUNTER — Ambulatory Visit (INDEPENDENT_AMBULATORY_CARE_PROVIDER_SITE_OTHER): Payer: PPO

## 2022-02-28 DIAGNOSIS — J189 Pneumonia, unspecified organism: Secondary | ICD-10-CM

## 2022-03-07 ENCOUNTER — Institutional Professional Consult (permissible substitution): Payer: PPO | Admitting: Cardiology

## 2022-03-10 ENCOUNTER — Encounter: Payer: Self-pay | Admitting: Cardiology

## 2022-03-24 ENCOUNTER — Ambulatory Visit: Payer: PPO | Attending: Cardiology | Admitting: Cardiology

## 2022-03-24 ENCOUNTER — Encounter: Payer: Self-pay | Admitting: Cardiology

## 2022-03-24 VITALS — BP 142/68 | HR 76 | Ht 74.0 in | Wt 238.0 lb

## 2022-03-24 DIAGNOSIS — I2510 Atherosclerotic heart disease of native coronary artery without angina pectoris: Secondary | ICD-10-CM

## 2022-03-24 DIAGNOSIS — I472 Ventricular tachycardia, unspecified: Secondary | ICD-10-CM

## 2022-03-24 NOTE — Patient Instructions (Signed)
Medication Instructions:  Your physician recommends that you continue on your current medications as directed. Please refer to the Current Medication list given to you today.  *If you need a refill on your cardiac medications before your next appointment, please call your pharmacy*   Lab Work: None ordered   Testing/Procedures: None ordered   Follow-Up: At [facility], you and your health needs are our priority.  As part of our continuing mission to provide you with exceptional heart care, we have created designated Provider Care Teams.  These Care Teams include your primary Cardiologist (physician) and Advanced Practice Providers (APPs -  Physician Assistants and Nurse Practitioners) who all work together to provide you with the care you need, when you need it.  Your next appointment:   as  needed  The format for your next appointment:   In Person  Provider:   Allegra Lai, MD    Thank you for choosing [facility]!!   Trinidad Curet, RN [phone]  Other Instructions   Important Information About Sugar

## 2022-03-24 NOTE — Progress Notes (Signed)
Electrophysiology Office Note   Date:  03/24/2022   ID:  Random Dean Guzman, DOB 1951/01/27, MRN [id number]  PCP:  Shelda Pal, DO  Cardiologist:  Agustin Cree Primary Electrophysiologist:  Taria Castrillo Meredith Leeds, MD    Chief Complaint: VT   History of Present Illness: Dean Guzman is a 71 y.o. male who is being seen today for the evaluation of VT at the request of Park Liter, MD. Presenting today for electrophysiology evaluation.  He has a history significant for factor V Leiden, hyperlipidemia, hypertension, coronary artery disease.  He is status post LAD stent.  He had an echo that showed a normal ejection fraction.  Post stenting, he presented to cardiology clinic with shortness of breath.  He did not have the symptoms prior to stenting.  He has had no chest pain, simply shortness of breath.  He also continues to have episodes of lightheadedness.  He has no chest pain.  His lightheaded episodes occur every few weeks and last for minutes at a time.  He is worn 2 separate cardiac monitors, 1 pre and 1 post stenting.  Both monitor showed runs of VT, 5 and 4 beats.  He had no symptoms of lightheadedness while wearing the monitor.  Today, Dean Guzman denies symptoms of palpitations, chest pain, shortness of breath, orthopnea, PND, lower extremity edema, claudication, dizziness, presyncope, syncope, bleeding, or neurologic sequela. The patient is tolerating medications without difficulties.    Past Medical History:  Diagnosis Date   Barrett's esophagus    BPH (benign prostatic hyperplasia)    Colon polyps    Complication of anesthesia    during biopsy, felt hot and sweating   FH: factor V Leiden mutation    niece died of leiden factor five complications, sister has   Frequent headaches    GERD (gastroesophageal reflux disease)    History of kidney stones    Hyperlipidemia    Hypertension    Migraines     no recent migraines had visual aura   Prostate cancer  [facility])    Past Surgical History:  Procedure Laterality Date   colonscopy     CORONARY ANGIOGRAPHY N/A 09/08/2021   Procedure: CORONARY ANGIOGRAPHY;  Surgeon: Sherren Mocha, MD;  Location: [place] [facility];  Service: Cardiovascular;  Laterality: N/A;   CORONARY STENT INTERVENTION N/A 09/08/2021   Procedure: CORONARY STENT INTERVENTION;  Surgeon: Sherren Mocha, MD;  Location: [place] [facility];  Service: Cardiovascular;  Laterality: N/A;   CYSTOSCOPY N/A 01/13/2020   Procedure: CYSTOSCOPY FLEXIBLE;  Surgeon: Lucas Mallow, MD;  Location: [facility];  Service: Urology;  Laterality: N/A;  NO SEEDS FOUND IN BLADDER   INTRAVASCULAR PRESSURE WIRE/FFR STUDY N/A 09/08/2021   Procedure: INTRAVASCULAR PRESSURE WIRE/FFR STUDY;  Surgeon: Sherren Mocha, MD;  Location: [place] [facility];  Service: Cardiovascular;  Laterality: N/A;   MASS EXCISION Left 02/08/2021   Procedure: EXCISION MASS OF MUCOID CYST WITH DISTAL INTERPHANGEAL JOINT ARTHROTOMY LEFT MIDDLE FINGER;  Surgeon: [name], MD;  Location: [place];  Service: Orthopedics;  Laterality: Left;   NASAL ENDOSCOPY     PROSTATE BIOPSY  may 2021 and 2020   RADIOACTIVE SEED IMPLANT N/A 01/13/2020   Procedure: RADIOACTIVE SEED IMPLANT/BRACHYTHERAPY IMPLANT;  Surgeon: Lucas Mallow, MD;  Location: [place];  Service: Urology;  Laterality: N/A;    73 SEEDS IMPLANTED   SPACE OAR INSTILLATION N/A 01/13/2020   Procedure: SPACE OAR INSTILLATION;  Surgeon: Marton Redwood  III, MD;  Location: [place];  Service: Urology;  Laterality: N/A;     Current Outpatient Medications  Medication Sig Dispense Refill   amLODipine (NORVASC) 5 MG tablet Take 1 tablet by mouth daily. 90 tablet 0   aspirin 81 MG EC tablet Take 1 tablet (81 mg total) by mouth daily. 90 tablet 1   cholecalciferol (VITAMIN D3) 25 MCG (1000 UT) tablet Take 1,000 Units by mouth daily.     diltiazem (DILACOR XR) 240  MG 24 hr capsule Take 1 capsule by mouth daily. 90 capsule 0   ipratropium (ATROVENT) 0.06 % nasal spray Place 2 sprays into both nostrils 3 (three) times daily as needed for rhinitis. 45 mL 3   lisinopril (ZESTRIL) 10 MG tablet Take 1 tablet (10 mg total) by mouth daily. 90 tablet 3   nitroGLYCERIN (NITROSTAT) 0.4 MG SL tablet Place 1 tablet (0.4 mg total) under the tongue every 5 (five) minutes as needed for chest pain. Do not use with tadalafil and sildenafil 25 tablet 2   Omega-3 Fatty Acids (FISH OIL) 1200 MG CAPS Take 1,200 mg by mouth 2 (two) times daily.     pantoprazole (PROTONIX) 40 MG tablet Take 1 tablet (40 mg total) by mouth 2 (two) times daily.     rosuvastatin (CRESTOR) 40 MG tablet Take 1 tablet (40 mg total) by mouth daily. 90 tablet 3   Vibegron (GEMTESA) 75 MG TABS Take 75 mg by mouth daily.     Current Facility-Administered Medications  Medication Dose Route Frequency Provider Last Rate Last Admin   dexamethasone (DECADRON) injection 4 mg  4 mg Intra-articular Once Lorenda Peck, DPM        Allergies:   Patient has no known allergies.   Social History:  The patient  reports that Linzy Darling quit smoking about 43 years ago. Tyris Hefter's smoking use included cigarettes. Atwood Adcock has a 12.00 pack-year smoking history. Hines Kloss has never used smokeless tobacco. Madlyn Frankel reports current alcohol use. Algernon Mundie reports that Demere Dotzler does not currently use drugs.   Family History:  The patient's family history includes Arthritis in [place] Cotten's mother; Breast cancer in [place] Bello's sister; Diabetes in [place] Tappan's maternal grandmother; Early death in [place]'s paternal grandfather and paternal grandmother; Hearing loss in [place] Hall[place]y's paternal grandfather and paternal grandmother; Heart disease in [place] Goncalves's father; Hyperlipidemia in [place] Sacra's father; Hypertension in [place] Weisenberger's father.    ROS:  Please  see the history of present illness.   Otherwise, review of systems is positive for none.   All other systems are reviewed and negative.    PHYSICAL EXAM: VS:  BP (!) 142/68   Pulse 76   Ht '6\' 2"'$  (1.88 m)   Wt 238 lb (108 kg)   BMI 30.56 kg/m  , BMI Body mass index is 30.56 kg/m. GEN: Well nourished, well developed, in no acute distress  HEENT: normal  Neck: no JVD, carotid bruits, or masses Cardiac: RRR; no murmurs, rubs, or gallops,no edema  Respiratory:  clear to auscultation bilaterally, normal work of breathing GI: soft, nontender, nondistended, + BS MS: no deformity or atrophy  Skin: warm and dry Neuro:  Strength and sensation are intact Psych: euthymic mood, full affect  EKG:  EKG is ordered today. Personal review of the ekg ordered shows sinus rhythm, rate 90  Recent Labs: 06/29/2021: TSH 1.18 01/17/2022: ALT 26; BUN 22; Creatinine, Ser 1.28; Hemoglobin 15.2; Platelets 150; Potassium 3.6; Sodium 136  Lipid Panel     Component Value Date/Time   CHOL 135 12/13/2021 0905   TRIG 111 12/13/2021 0905   HDL 45 12/13/2021 0905   CHOLHDL 3.0 12/13/2021 0905   CHOLHDL 4 06/29/2021 1107   VLDL 22.0 06/29/2021 1107   LDLCALC 70 12/13/2021 0905     Wt Readings from Last 3 Encounters:  03/24/22 238 lb (108 kg)  01/19/22 239 lb 2 oz (108.5 kg)  01/17/22 237 lb (107.5 kg)      Other studies Reviewed: Additional studies/ records that were reviewed today include: TTE 07/22/21  Review of the above records today demonstrates:   1. Left ventricular ejection fraction, by estimation, is 60 to 65%. The  left ventricle has normal function. The left ventricle has no regional  wall motion abnormalities. Left ventricular diastolic parameters were  normal.   2. Right ventricular systolic function is normal. The right ventricular  size is normal.   3. Left atrial size was mildly dilated.   4. The mitral valve is normal in structure. Mild mitral valve  regurgitation. No evidence  of mitral stenosis.   5. The aortic valve is normal in structure. Aortic valve regurgitation is  not visualized. No aortic stenosis is present.   6. The inferior vena cava is normal in size with greater than 50%  respiratory variability, suggesting right atrial pressure of 3 mmHg.   LHC 09/08/21   Prox RCA lesion is 40% stenosed.   1st Mrg lesion is 40% stenosed.   Mid LAD lesion is 80% stenosed.   Dist LAD lesion is 90% stenosed.   A drug-eluting stent was successfully placed using a STENT ONYX FRONTIER 3.0X30.   Post intervention, there is a 0% residual stenosis.  Cardiac monitor 01/19/2022 personally reviewed 1 episode of ventricular tachycardia 5 beats at rate of 184 21 episode of supraventricular tachycardia with the longest episode of 7 beats at rate of 106 Frequent supraventricular ectopy with total burden of 6%  ASSESSMENT AND PLAN:  1.  Coronary artery disease: Status post LAD stent.  No current chest pain.  Continue with plan per primary cardiology.  2.  Ventricular tachycardia: Wore a Zio patch with a 5 beat run of ventricular tachycardia.  He wore a cardiac monitor previously and that showed a 4 beat run of VT.  He has since had an LAD stent placed.  He was asymptomatic while wearing his monitor.  As his ejection fraction is normal, I do not feel that we need aggressive therapy for this.  If episodes of VT continue, would treat with beta-blockers, but for now, as he is feeling well, would hold off.  3.  Hyperlipidemia: Continue statin per primary cardiologist  4.  Hypertension: Mildly elevated today.  Usually well controlled.  No changes.  5.  Lightheadedness: At this point is unclear to his lightheadedness.  I told him that we could potentially implant an ILR, though he has an Apple watch and as these episodes last for minutes at a time, he Ashleah Valtierra get an ECG when he has an episode and presented to his primary cardiologist.  Case discussed with primary cardiology   Current  medicines are reviewed at length with the patient today.   The patient does not have concerns regarding Zev Mikulski's medicines.  The following changes were made today:  none  Labs/ tests ordered today include:  No orders of the defined types were placed in this encounter.    Disposition:   FU with Erin Uecker  PRN  Signed, Shaurya Rawdon Meredith Leeds, MD  03/24/2022 8:47 AM     [facility] [address] [place] [address] [phone] (office) [phone] (fax)

## 2022-03-30 ENCOUNTER — Ambulatory Visit: Payer: Self-pay | Admitting: Surgery

## 2022-03-30 DIAGNOSIS — K440 Diaphragmatic hernia with obstruction, without gangrene: Secondary | ICD-10-CM | POA: Diagnosis not present

## 2022-03-30 DIAGNOSIS — K2270 Barrett's esophagus without dysplasia: Secondary | ICD-10-CM | POA: Diagnosis not present

## 2022-04-05 ENCOUNTER — Telehealth: Payer: Self-pay

## 2022-04-05 ENCOUNTER — Encounter: Payer: Self-pay | Admitting: Cardiology

## 2022-04-05 NOTE — Telephone Encounter (Signed)
   Name: Dean Guzman  DOB: [birth year]  MRN: [id number]  Primary Cardiologist: None  Chart reviewed as part of pre-operative protocol coverage. Because of Dean Guzman's past medical history and time since last visit, Dean Guzman will require a follow-up telephone visit in order to better assess preoperative cardiovascular risk.  Pre-op covering staff: - Please schedule appointment and call patient to inform them. If patient already had an upcoming appointment within acceptable timeframe, please add "pre-op clearance" to the appointment notes so provider is aware. - Please contact requesting surgeon's office via preferred method (i.e, phone, fax) to inform them of need for appointment prior to surgery.  The patient is not on any blood thinners or antiplatelet agents that would need held.   Elgie Collard, PA-C  04/05/2022, 1:52 PM

## 2022-04-05 NOTE — Telephone Encounter (Signed)
   [facility] Pre-operative Risk Assessment    Request for surgical clearance:  What type of surgery is being performed? Robotic Repair Hiatal Hernia    When is this surgery scheduled? TBD   What type of clearance is required (medical clearance vs. Pharmacy clearance to hold med vs. Both)? Both  Are there any medications that need to be held prior to surgery and how long?Not specified    Practice name and name of physician performing surgery? Dr. Remo Lipps gross at [facility]   What is your office phone number:  [phone]   7.   What is your office fax number: [phone]  8.   Anesthesia type (None, local, MAC, general) ? General Anesthesia   Basil Dess Jenni Thew 04/05/2022, 1:46 PM  _________________________________________________________________ --  (provider comments below) --

## 2022-04-06 ENCOUNTER — Telehealth: Payer: Self-pay | Admitting: *Deleted

## 2022-04-06 ENCOUNTER — Institutional Professional Consult (permissible substitution): Payer: PPO | Admitting: Cardiology

## 2022-04-06 ENCOUNTER — Other Ambulatory Visit: Payer: Self-pay | Admitting: Gastroenterology

## 2022-04-06 NOTE — Telephone Encounter (Signed)
Primary cardiologist is Dr. Agustin Cree. Pt agreeable to plan of care for tele pre op appt 04/12/22 @ 9:20. Med rec and consent are done.

## 2022-04-06 NOTE — Telephone Encounter (Signed)
Primary cardiologist is Dr. Agustin Cree. Pt agreeable to plan of care for tele pre op appt 04/12/22 @ 9:20. Med rec and consent are done.      Patient Consent for Virtual Visit        Dean Guzman has provided verbal consent on 04/06/2022 for a virtual visit (video or telephone).   CONSENT FOR VIRTUAL VISIT FOR:  Dean Guzman  By participating in this virtual visit I agree to the following:  I hereby voluntarily request, consent and authorize [facility] and its employed or contracted physicians, physician assistants, nurse practitioners or other licensed health care professionals (the Practitioner), to provide me with telemedicine health care services (the "Services") as deemed necessary by the treating Practitioner. I acknowledge and consent to receive the Services by the Practitioner via telemedicine. I understand that the telemedicine visit will involve communicating with the Practitioner through live audiovisual communication technology and the disclosure of certain medical information by electronic transmission. I acknowledge that I have been given the opportunity to request an in-person assessment or other available alternative prior to the telemedicine visit and am voluntarily participating in the telemedicine visit.  I understand that I have the right to withhold or withdraw my consent to the use of telemedicine in the course of my care at any time, without affecting my right to future care or treatment, and that the Practitioner or I may terminate the telemedicine visit at any time. I understand that I have the right to inspect all information obtained and/or recorded in the course of the telemedicine visit and may receive copies of available information for a reasonable fee.  I understand that some of the potential risks of receiving the Services via telemedicine include:  Delay or interruption in medical evaluation due to technological equipment failure or  disruption; Information transmitted may not be sufficient (e.g. poor resolution of images) to allow for appropriate medical decision making by the Practitioner; and/or  In rare instances, security protocols could fail, causing a breach of personal health information.  Furthermore, I acknowledge that it is my responsibility to provide information about my medical history, conditions and care that is complete and accurate to the best of my ability. I acknowledge that Practitioner's advice, recommendations, and/or decision may be based on factors not within their control, such as incomplete or inaccurate data provided by me or distortions of diagnostic images or specimens that may result from electronic transmissions. I understand that the practice of medicine is not an exact science and that Practitioner makes no warranties or guarantees regarding treatment outcomes. I acknowledge that a copy of this consent can be made available to me via my patient portal ([facility]), or I can request a printed copy by calling the office of [facility].    I understand that my insurance will be billed for this visit.   I have read or had this consent read to me. I understand the contents of this consent, which adequately explains the benefits and risks of the Services being provided via telemedicine.  I have been provided ample opportunity to ask questions regarding this consent and the Services and have had my questions answered to my satisfaction. I give my informed consent for the services to be provided through the use of telemedicine in my medical care

## 2022-04-12 ENCOUNTER — Ambulatory Visit: Payer: PPO | Attending: Cardiology | Admitting: Physician Assistant

## 2022-04-12 DIAGNOSIS — Z01810 Encounter for preprocedural cardiovascular examination: Secondary | ICD-10-CM

## 2022-04-12 NOTE — Progress Notes (Signed)
Virtual Visit via Telephone Note   Because of Casen Jeff's co-morbid illnesses, Cataldo Cosgriff is at least at moderate risk for complications without adequate follow up.  This format is felt to be most appropriate for this patient at this time.  The patient did not have access to video technology/had technical difficulties with video requiring transitioning to audio format only (telephone).  All issues noted in this document were discussed and addressed.  No physical exam could be performed with this format.  Please refer to the patient's chart for Arius Duchesne's consent to telehealth for [facility].  Evaluation Performed:  Preoperative cardiovascular risk assessment _____________   Date:  04/12/2022   Patient ID:  Dean Guzman, DOB [birth year], MRN [id number] Patient Location:  Home Provider location:   Office  Primary Care Provider:  Shelda Pal, DO Primary Cardiologist:  None  Chief Complaint / Patient Profile   71 y.o. y/o male with a h/o factor V Leiden mutation, dyslipidemia, essential hypertension, recent DES to mid LAD 08/2021, hypertension, hyperlipidemia, prostate cancer who is pending robotic hiatal hernia repair and presents today for telephonic preoperative cardiovascular risk assessment.   Past Medical History    Past Medical History:  Diagnosis Date   Barrett's esophagus    BPH (benign prostatic hyperplasia)    Colon polyps    Complication of anesthesia    during biopsy, felt hot and sweating   FH: factor V Leiden mutation    niece died of leiden factor five complications, sister has   Frequent headaches    GERD (gastroesophageal reflux disease)    History of kidney stones    Hyperlipidemia    Hypertension    Migraines     no recent migraines had visual aura   Prostate cancer [facility])    Past Surgical History:  Procedure Laterality Date   colonscopy     CORONARY ANGIOGRAPHY N/A 09/08/2021   Procedure: CORONARY ANGIOGRAPHY;   Surgeon: Sherren Mocha, MD;  Location: [place] [facility];  Service: Cardiovascular;  Laterality: N/A;   CORONARY STENT INTERVENTION N/A 09/08/2021   Procedure: CORONARY STENT INTERVENTION;  Surgeon: Sherren Mocha, MD;  Location: [place] [facility];  Service: Cardiovascular;  Laterality: N/A;   CYSTOSCOPY N/A 01/13/2020   Procedure: CYSTOSCOPY FLEXIBLE;  Surgeon: Lucas Mallow, MD;  Location: [facility];  Service: Urology;  Laterality: N/A;  NO SEEDS FOUND IN BLADDER   INTRAVASCULAR PRESSURE WIRE/FFR STUDY N/A 09/08/2021   Procedure: INTRAVASCULAR PRESSURE WIRE/FFR STUDY;  Surgeon: Sherren Mocha, MD;  Location: [place] [facility];  Service: Cardiovascular;  Laterality: N/A;   MASS EXCISION Left 02/08/2021   Procedure: EXCISION MASS OF MUCOID CYST WITH DISTAL INTERPHANGEAL JOINT ARTHROTOMY LEFT MIDDLE FINGER;  Surgeon: [name], MD;  Location: [place];  Service: Orthopedics;  Laterality: Left;   NASAL ENDOSCOPY     PROSTATE BIOPSY  may 2021 and 2020   RADIOACTIVE SEED IMPLANT N/A 01/13/2020   Procedure: RADIOACTIVE SEED IMPLANT/BRACHYTHERAPY IMPLANT;  Surgeon: Lucas Mallow, MD;  Location: [place];  Service: Urology;  Laterality: N/A;    73 SEEDS IMPLANTED   SPACE OAR INSTILLATION N/A 01/13/2020   Procedure: SPACE OAR INSTILLATION;  Surgeon: Lucas Mallow, MD;  Location: [facility];  Service: Urology;  Laterality: N/A;    Allergies  No Known Allergies  History of Present Illness    Gaege Sangalang is a 71 y.o. male who presents via audio/video conferencing  for a telehealth visit today.  Pt was last seen in cardiology clinic on 12/21/2021 by Dr. Agustin Cree.  At that time Jamale Spangler was having some symptoms of shortness of breath and his antianginal therapy was increased.  The patient is now pending procedure as outlined above. Since Courvoisier Churchill's last visit, Darrius Montano states that he has had no change  in his heart health.  He has been exercising daily and doing lots of yard work without any symptoms.  He denies chest pain, shortness of breath, and heart palpitations.  He tells me he walks 2-3 times a day since he has dogs.  He also enjoys hiking and camping.  His cardiac clearance request did not specify an aspirin hold and we would obviously prefer him to continue his aspirin perioperatively.  If the risk of bleeding is too high he can hold aspirin x7 days prior to the procedure and resume when medically safe to do so.  He scored a 5.62 METS on the DASI.  This exceeds the 4 METS minimum requirement.  No medications indicated as needing held.  Home Medications    Prior to Admission medications   Medication Sig Start Date End Date Taking? Authorizing Provider  amLODipine (NORVASC) 5 MG tablet Take 1 tablet by mouth daily. 02/22/22   Shelda Pal, DO  aspirin EC 81 MG tablet Take 81 mg by mouth daily. Swallow whole.    [provider]  cholecalciferol (VITAMIN D3) 25 MCG (1000 UT) tablet Take 1,000 Units by mouth daily.    [provider]  diltiazem (DILACOR XR) 240 MG 24 hr capsule Take 1 capsule by mouth daily. 02/22/22   Shelda Pal, DO  ipratropium (ATROVENT) 0.06 % nasal spray Place 2 sprays into both nostrils 3 (three) times daily as needed for rhinitis. 02/04/22   Shelda Pal, DO  lisinopril (ZESTRIL) 10 MG tablet Take 1 tablet (10 mg total) by mouth daily. 10/26/21 10/21/22  Park Liter, MD  nitroGLYCERIN (NITROSTAT) 0.4 MG SL tablet Place 1 tablet (0.4 mg total) under the tongue every 5 (five) minutes as needed for chest pain. Do not use with tadalafil and sildenafil 09/08/21 09/08/22  Margie Billet, NP  Omega-3 Fatty Acids (FISH OIL) 1200 MG CAPS Take 1,200 mg by mouth 2 (two) times daily.    [provider]  pantoprazole (PROTONIX) 40 MG tablet Take 1 tablet (40 mg total) by mouth 2 (two) times daily. 01/19/22   Shelda Pal, DO  rosuvastatin (CRESTOR) 40 MG tablet Take 1 tablet (40 mg total) by mouth daily. 10/26/21 10/26/22  Park Liter, MD  Vibegron (GEMTESA) 75 MG TABS Take 75 mg by mouth daily.    [provider]    Physical Exam    Vital Signs:  Jayjay Littles does not have vital signs available for review today.  130s/70s  Given telephonic nature of communication, physical exam is limited. AAOx3. NAD. Normal affect.  Speech and respirations are unlabored.  Accessory Clinical Findings    None  Assessment & Plan    1.  Preoperative Cardiovascular Risk Assessment:    Burkel's perioperative risk of a major cardiac event is 6.6% according to the Revised Cardiac Risk Index (RCRI).  Therefore, Milan Perkins is at high risk for perioperative complications.   Quanell Ptacek's functional capacity is good at 5.62 METs according to the [facility] Activity Status Index (DASI). Recommendations: According to ACC/AHA guidelines, no further cardiovascular testing needed.  The patient may proceed to  surgery at acceptable risk.   Antiplatelet and/or Anticoagulation Recommendations: The patient should remain on Aspirin without interruption.      A copy of this note will be routed to requesting surgeon.  Time:   Today, I have spent 15 minutes with the patient with telehealth technology discussing medical history, symptoms, and management plan.     Elgie Collard, PA-C  04/12/2022, 9:21 AM

## 2022-04-27 ENCOUNTER — Encounter (HOSPITAL_COMMUNITY): Payer: Self-pay | Admitting: Anesthesiology

## 2022-04-28 ENCOUNTER — Ambulatory Visit (HOSPITAL_COMMUNITY)
Admission: RE | Admit: 2022-04-28 | Discharge: 2022-04-28 | Disposition: A | Discharge status: Home / Self Care | Payer: PPO | Attending: Gastroenterology | Admitting: Gastroenterology

## 2022-04-28 ENCOUNTER — Encounter (HOSPITAL_COMMUNITY)
Admission: RE | Disposition: A | Discharge status: Home / Self Care | Payer: Self-pay | Source: Home / Self Care | Attending: Gastroenterology

## 2022-04-28 ENCOUNTER — Encounter (HOSPITAL_COMMUNITY): Payer: Self-pay | Admitting: Gastroenterology

## 2022-04-28 DIAGNOSIS — K449 Diaphragmatic hernia without obstruction or gangrene: Secondary | ICD-10-CM | POA: Diagnosis not present

## 2022-04-28 DIAGNOSIS — K2270 Barrett's esophagus without dysplasia: Secondary | ICD-10-CM | POA: Insufficient documentation

## 2022-04-28 HISTORY — PX: ESOPHAGEAL MANOMETRY: SHX5429

## 2022-04-28 SURGERY — MANOMETRY, ESOPHAGUS
Anesthesia: Monitor Anesthesia Care

## 2022-04-28 MED ORDER — LIDOCAINE VISCOUS HCL 2 % MT SOLN
OROMUCOSAL | Status: AC
  Filled 2022-04-28: qty 15

## 2022-04-28 SURGICAL SUPPLY — 2 items
FACESHIELD LNG OPTICON STERILE (SAFETY) IMPLANT
GLOVE BIO SURGEON STRL SZ8 (GLOVE) ×2 IMPLANT

## 2022-04-28 NOTE — Progress Notes (Signed)
Esophageal manometry performed per protocol without complications.  Patient tolerated well. 

## 2022-04-29 ENCOUNTER — Encounter (HOSPITAL_COMMUNITY): Payer: Self-pay | Admitting: Gastroenterology

## 2022-05-03 DIAGNOSIS — K219 Gastro-esophageal reflux disease without esophagitis: Secondary | ICD-10-CM | POA: Diagnosis not present

## 2022-05-03 DIAGNOSIS — K449 Diaphragmatic hernia without obstruction or gangrene: Secondary | ICD-10-CM | POA: Diagnosis not present

## 2022-05-05 NOTE — Patient Instructions (Addendum)
SURGICAL WAITING ROOM VISITATION Patients having surgery or a procedure may have no more than 2 support people in the waiting area - these visitors may rotate.   Children under the age of 28 must have an adult with them who is not the patient. If the patient needs to stay at the hospital during part of their recovery, the visitor guidelines for inpatient rooms apply. Pre-op nurse will coordinate an appropriate time for 1 support person to accompany patient in pre-op.  This support person may not rotate.    Please refer to the [facility] website for the visitor guidelines for Inpatients (after your surgery is over and you are in a regular room).       Your procedure is scheduled on: 05-19-22   Report to [facility] Main Entrance    Report to admitting at      [place]  AM   Call this number if you have problems the morning of surgery [phone]   Do not eat food :After Midnight.   After Midnight you may have the following liquids until __0430____ AM/ DAY OF SURGERY    then nothing by mouth  Water Non-Citrus Juices (without pulp, NO RED) Carbonated Beverages Black Coffee (NO MILK/CREAM OR CREAMERS, sugar ok)  Clear Tea (NO MILK/CREAM OR CREAMERS, sugar ok) regular and decaf                             Plain Jell-O (NO RED)                                           Fruit ices (not with fruit pulp, NO RED)                                     Popsicles (NO RED)                                                               Sports drinks like Gatorade (NO RED)              Drink 2 Ensure/ drinks AT 10:00 PM the night before surgery.        The day of surgery:  Drink ONE (1) Pre-Surgery Clear Ensure     0415 AM the morning of surgery. Drink in one sitting. Do not sip.  This drink was given to you during your hospital  pre-op appointment visit. Nothing else to drink after completing the  Pre-Surgery Clear Ensure or G2.          If you have questions, please contact your  surgeon's office.   FOLLOW BOWEL PREP AND ANY ADDITIONAL PRE OP INSTRUCTIONS YOU RECEIVED FROM YOUR SURGEON'S OFFICE!!!     Oral Hygiene is also important to reduce your risk of infection.                                    Remember - BRUSH YOUR TEETH THE MORNING OF SURGERY WITH YOUR  REGULAR TOOTHPASTE  DENTURES WILL BE REMOVED PRIOR TO SURGERY PLEASE DO NOT APPLY "Poly grip" OR ADHESIVES!!!   Do NOT smoke after Midnight   Take these medicines the morning of surgery with A SIP OF WATER:   DO NOT TAKE ANY ORAL DIABETIC MEDICATIONS DAY OF YOUR SURGERY  Bring CPAP mask and tubing day of surgery.                              You may not have any metal on your body including hair pins, jewelry, and body piercing             Do not wear make-up, lotions, powders, perfumes/cologne, or deodorant  Do not wear nail polish including gel and S&S, artificial/acrylic nails, or any other type of covering on natural nails including finger and toenails. If you have artificial nails, gel coating, etc. that needs to be removed by a nail salon please have this removed prior to surgery or surgery may need to be canceled/ delayed if the surgeon/ anesthesia feels like they are unable to be safely monitored.   Do not shave  48 hours prior to surgery.               Men may shave face and neck.   Do not bring valuables to the hospital. [place].   Contacts, glasses, dentures or bridgework may not be worn into surgery.   Bring small overnight bag day of surgery.   DO NOT [place]. PHARMACY WILL DISPENSE MEDICATIONS LISTED ON YOUR MEDICATION LIST TO YOU DURING YOUR ADMISSION [place]!    Patients discharged on the day of surgery will not be allowed to drive home.  Someone NEEDS to stay with you for the first 24 hours after anesthesia.   Special Instructions: Bring a copy of your healthcare power of attorney and  living will documents the day of surgery if you haven't scanned them before.              Please read over the following fact sheets you were given: IF [place] [phone]   If you received a COVID test during your pre-op visit  it is requested that you wear a mask when out in public, stay away from anyone that may not be feeling well and notify your surgeon if you develop symptoms. If you test positive for Covid or have been in contact with anyone that has tested positive in the last 10 days please notify you surgeon.    [place] - Preparing for Surgery Before surgery, you can play an important role.  Because skin is not sterile, your skin needs to be as free of germs as possible.  You can reduce the number of germs on your skin by washing with CHG (chlorahexidine gluconate) soap before surgery.  CHG is an antiseptic cleaner which kills germs and bonds with the skin to continue killing germs even after washing. Please DO NOT use if you have an allergy to CHG or antibacterial soaps.  If your skin becomes reddened/irritated stop using the CHG and inform your nurse when you arrive at Short Stay. Do not shave (including legs and underarms) for at least 48 hours prior to the first CHG shower.  You may  shave your face/neck. Please follow these instructions carefully:  1.  Shower with CHG Soap the night before surgery and the  morning of Surgery.  2.  If you choose to wash your hair, wash your hair first as usual with your  normal  shampoo.  3.  After you shampoo, rinse your hair and body thoroughly to remove the  shampoo.                           4.  Use CHG as you would any other liquid soap.  You can apply chg directly  to the skin and wash                       Gently with a scrungie or clean washcloth.  5.  Apply the CHG Soap to your body ONLY FROM THE NECK DOWN.   Do not use on face/ open                           Wound or open sores. Avoid  contact with eyes, ears mouth and genitals (private parts).                       Wash face,  Genitals (private parts) with your normal soap.             6.  Wash thoroughly, paying special attention to the area where your surgery  will be performed.  7.  Thoroughly rinse your body with warm water from the neck down.  8.  DO NOT shower/wash with your normal soap after using and rinsing off  the CHG Soap.                9.  Pat yourself dry with a clean towel.            10.  Wear clean pajamas.            11.  Place clean sheets on your bed the night of your first shower and do not  sleep with pets. Day of Surgery : Do not apply any lotions/deodorants the morning of surgery.  Please wear clean clothes to the hospital/surgery center.  FAILURE TO FOLLOW THESE INSTRUCTIONS MAY RESULT IN THE CANCELLATION OF YOUR SURGERY PATIENT SIGNATURE_________________________________  NURSE SIGNATURE__________________________________  ________________________________________________________________________

## 2022-05-05 NOTE — Progress Notes (Addendum)
PCP - Dr. Riki Sheer Cardiologist -  [place] [facility] , MD Clearance Nicholes Rough PA-C 04-12-22 epic  PPM/ICD -  Device Orders -  Rep Notified -   Chest x-ray -  EKG - 01-17-22 epic Stress Test - 08-25-21 epic ECHO - 07-22-21 epic Cardiac Cath -   Sleep Study -  CPAP -   Fasting Blood Sugar -  Checks Blood Sugar _____ times a day  Blood Thinner Instructions: Aspirin Instructions: 81 mg per cards would like him to remain on asa  ERAS Protcol - PRE-SURGERY Ensure   COVID vaccine -x 3 or 4   Activity--Able to exercise without SOB or CP Anesthesia review: CAD/stent 08-2021, HTN,   Patient denies shortness of breath, fever, cough and chest pain at PAT appointment   All instructions explained to the patient, with a verbal understanding of the material. Patient agrees to go over the instructions while at home for a better understanding. Patient also instructed to self quarantine after being tested for COVID-19. The opportunity to ask questions was provided.

## 2022-05-06 ENCOUNTER — Encounter (HOSPITAL_COMMUNITY)
Admission: RE | Admit: 2022-05-06 | Discharge: 2022-05-06 | Disposition: A | Discharge status: Home / Self Care | Payer: PPO | Source: Ambulatory Visit | Attending: Surgery | Admitting: Surgery

## 2022-05-06 ENCOUNTER — Encounter (HOSPITAL_COMMUNITY): Payer: Self-pay

## 2022-05-06 ENCOUNTER — Other Ambulatory Visit: Payer: Self-pay

## 2022-05-06 DIAGNOSIS — Z87891 Personal history of nicotine dependence: Secondary | ICD-10-CM | POA: Diagnosis not present

## 2022-05-06 DIAGNOSIS — Z8546 Personal history of malignant neoplasm of prostate: Secondary | ICD-10-CM | POA: Diagnosis not present

## 2022-05-06 DIAGNOSIS — K449 Diaphragmatic hernia without obstruction or gangrene: Secondary | ICD-10-CM | POA: Insufficient documentation

## 2022-05-06 DIAGNOSIS — Z01812 Encounter for preprocedural laboratory examination: Secondary | ICD-10-CM | POA: Insufficient documentation

## 2022-05-06 DIAGNOSIS — I10 Essential (primary) hypertension: Secondary | ICD-10-CM | POA: Insufficient documentation

## 2022-05-06 HISTORY — DX: Atherosclerotic heart disease of native coronary artery without angina pectoris: I25.10

## 2022-05-06 HISTORY — DX: Pneumonia, unspecified organism: J18.9

## 2022-05-06 HISTORY — DX: Myoneural disorder, unspecified: G70.9

## 2022-05-06 HISTORY — DX: Personal history of other diseases of the digestive system: Z87.19

## 2022-05-06 LAB — BASIC METABOLIC PANEL
Anion gap: 9 (ref 5–15)
BUN: 17 mg/dL (ref 8–23)
CO2: 26 mmol/L (ref 22–32)
Calcium: 9.3 mg/dL (ref 8.9–10.3)
Chloride: 104 mmol/L (ref 98–111)
Creatinine, Ser: 0.75 mg/dL (ref 0.61–1.24)
GFR, Estimated: >60 mL/min (ref >60)
Glucose, Bld: 113 mg/dL — ABNORMAL HIGH (ref 70–99)
Potassium: 4.5 mmol/L (ref 3.5–5.1)
Sodium: 139 mmol/L (ref 135–145)

## 2022-05-06 LAB — CBC
HCT: 48.5 % (ref 39.0–52.0)
Hemoglobin: 15.6 g/dL (ref 13.0–17.0)
MCH: 29.5 pg (ref 26.0–34.0)
MCHC: 32.2 g/dL (ref 30.0–36.0)
MCV: 91.7 fL (ref 80.0–100.0)
Platelets: 187 10*3/uL (ref 150–400)
RBC: 5.29 MIL/uL (ref 4.22–5.81)
RDW: 12.7 % (ref 11.5–15.5)
WBC: 5.3 10*3/uL (ref 4.0–10.5)
nRBC: 0 % (ref 0.0–0.2)

## 2022-05-06 NOTE — Patient Instructions (Signed)
SURGICAL WAITING ROOM VISITATION Patients having surgery or a procedure may have no more than 2 support people in the waiting area - these visitors may rotate.   Children under the age of 47 must have an adult with them who is not the patient. If the patient needs to stay at the hospital during part of their recovery, the visitor guidelines for inpatient rooms apply. Pre-op nurse will coordinate an appropriate time for 1 support person to accompany patient in pre-op.  This support person may not rotate.    Please refer to the [facility] website for the visitor guidelines for Inpatients (after your surgery is over and you are in a regular room).       Your procedure is scheduled on: 05-19-22   Report to [facility] Main Entrance    Report to admitting at      [place]  AM   Call this number if you have problems the morning of surgery [phone]   Do not eat food :After Midnight.   After Midnight you may have the following liquids until __0430____ AM/ DAY OF SURGERY    then nothing by mouth  Water Non-Citrus Juices (without pulp, NO RED) Carbonated Beverages Black Coffee (NO MILK/CREAM OR CREAMERS, sugar ok)  Clear Tea (NO MILK/CREAM OR CREAMERS, sugar ok) regular and decaf                             Plain Jell-O (NO RED)                                           Fruit ices (not with fruit pulp, NO RED)                                     Popsicles (NO RED)                                                               Sports drinks like Gatorade (NO RED)              Drink 2 Ensure/ drinks AT 10:00 PM the night before surgery.        The day of surgery:  Drink ONE (1) Pre-Surgery Clear Ensure     0415 AM the morning of surgery. Drink in one sitting. Do not sip.  This drink was given to you during your hospital  pre-op appointment visit. Nothing else to drink after completing the  Pre-Surgery Clear Ensure or G2.          If you have questions, please contact your  surgeon's office.   FOLLOW BOWEL PREP AND ANY ADDITIONAL PRE OP INSTRUCTIONS YOU RECEIVED FROM YOUR SURGEON'S OFFICE!!!     Oral Hygiene is also important to reduce your risk of infection.                                    Remember - BRUSH YOUR TEETH THE MORNING OF SURGERY WITH YOUR  REGULAR TOOTHPASTE  DENTURES WILL BE REMOVED PRIOR TO SURGERY PLEASE DO NOT APPLY "Poly grip" OR ADHESIVES!!!   Do NOT smoke after Midnight   Take these medicines the morning of surgery with A SIP OF WATER: Vibegron, pantoprazole, rosuvastatin, nasal spray, diltiazem  DO NOT TAKE ANY ORAL DIABETIC MEDICATIONS DAY OF YOUR SURGERY  Bring CPAP mask and tubing day of surgery.                              You may not have any metal on your body including hair pins, jewelry, and body piercing             Do not wear make-up, lotions, powders, perfumes/cologne, or deodorant  Do not wear nail polish including gel and S&S, artificial/acrylic nails, or any other type of covering on natural nails including finger and toenails. If you have artificial nails, gel coating, etc. that needs to be removed by a nail salon please have this removed prior to surgery or surgery may need to be canceled/ delayed if the surgeon/ anesthesia feels like they are unable to be safely monitored.   Do not shave  48 hours prior to surgery.               Men may shave face and neck.   Do not bring valuables to the hospital. [place].   Contacts, glasses, dentures or bridgework may not be worn into surgery.   Bring small overnight bag day of surgery.   DO NOT [place]. PHARMACY WILL DISPENSE MEDICATIONS LISTED ON YOUR MEDICATION LIST TO YOU DURING YOUR ADMISSION [place]!    Patients discharged on the day of surgery will not be allowed to drive home.  Someone NEEDS to stay with you for the first 24 hours after anesthesia.   Special  Instructions: Bring a copy of your healthcare power of attorney and living will documents the day of surgery if you haven't scanned them before.              Please read over the following fact sheets you were given: IF [place] [phone]   If you received a COVID test during your pre-op visit  it is requested that you wear a mask when out in public, stay away from anyone that may not be feeling well and notify your surgeon if you develop symptoms. If you test positive for Covid or have been in contact with anyone that has tested positive in the last 10 days please notify you surgeon.    [place] - Preparing for Surgery Before surgery, you can play an important role.  Because skin is not sterile, your skin needs to be as free of germs as possible.  You can reduce the number of germs on your skin by washing with CHG (chlorahexidine gluconate) soap before surgery.  CHG is an antiseptic cleaner which kills germs and bonds with the skin to continue killing germs even after washing. Please DO NOT use if you have an allergy to CHG or antibacterial soaps.  If your skin becomes reddened/irritated stop using the CHG and inform your nurse when you arrive at Short Stay. Do not shave (including legs and underarms) for at least 48 hours prior to the first  CHG shower.  You may shave your face/neck. Please follow these instructions carefully:  1.  Shower with CHG Soap the night before surgery and the  morning of Surgery.  2.  If you choose to wash your hair, wash your hair first as usual with your  normal  shampoo.  3.  After you shampoo, rinse your hair and body thoroughly to remove the  shampoo.                           4.  Use CHG as you would any other liquid soap.  You can apply chg directly  to the skin and wash                       Gently with a scrungie or clean washcloth.  5.  Apply the CHG Soap to your body ONLY FROM THE NECK DOWN.   Do not use  on face/ open                           Wound or open sores. Avoid contact with eyes, ears mouth and genitals (private parts).                       Wash face,  Genitals (private parts) with your normal soap.             6.  Wash thoroughly, paying special attention to the area where your surgery  will be performed.  7.  Thoroughly rinse your body with warm water from the neck down.  8.  DO NOT shower/wash with your normal soap after using and rinsing off  the CHG Soap.                9.  Pat yourself dry with a clean towel.            10.  Wear clean pajamas.            11.  Place clean sheets on your bed the night of your first shower and do not  sleep with pets. Day of Surgery : Do not apply any lotions/deodorants the morning of surgery.  Please wear clean clothes to the hospital/surgery center.  FAILURE TO FOLLOW THESE INSTRUCTIONS MAY RESULT IN THE CANCELLATION OF YOUR SURGERY PATIENT SIGNATURE_________________________________  NURSE SIGNATURE__________________________________  ________________________________________________________________________

## 2022-05-09 NOTE — Anesthesia Preprocedure Evaluation (Addendum)
Anesthesia Evaluation  Patient identified by MRN, date of birth, ID band Patient awake    Reviewed: Allergy & Precautions, NPO status , Patient's Chart, lab work & pertinent test results  History of Anesthesia Complications (+) PONV and history of anesthetic complications  Airway Mallampati: II  TM Distance: >3 FB Neck ROM: Full    Dental no notable dental hx. (+) Teeth Intact, Dental Advisory Given   Pulmonary former smoker   Pulmonary exam normal breath sounds clear to auscultation       Cardiovascular hypertension, + CAD and + Cardiac Stents (08/2021)  Normal cardiovascular exam Rhythm:Regular Rate:Normal  07/2021 [facility] Left ventricular function is abnormal. Global function is mildly reduced. Nuclear stress EF: 54 %. The left ventricular ejection fraction is mildly decreased (45-54%). End diastolic cavity size is normal. End systolic cavity size is normal    Neuro/Psych  Headaches  Neuromuscular disease    GI/Hepatic hiatal hernia,GERD  ,,  Endo/Other    Renal/GU Lab Results      Component                Value               Date                      CREATININE               0.75                05/06/2022                    K                        4.5                 05/06/2022                   Prostate CA    Musculoskeletal   Abdominal   Peds  Hematology  (+) Blood dyscrasia   Factor V leiden  Lab Results      Component                Value               Date                      WBC                      5.3                 05/06/2022                HGB                      15.6                05/06/2022                HCT                      48.5                05/06/2022                MCV  91.7                05/06/2022                PLT                      187                 05/06/2022              Anesthesia Other Findings NKDA  Reproductive/Obstetrics                              Anesthesia Physical Anesthesia Plan  ASA: 3  Anesthesia Plan: General   Post-op Pain Management: Ketamine IV* and Lidocaine infusion*   Induction: Intravenous  PONV Risk Score and Plan: Treatment may vary due to age or medical condition, Midazolam, Dexamethasone, Scopolamine patch - Pre-op and Ondansetron  Airway Management Planned: Oral ETT  Additional Equipment: None  Intra-op Plan:   Post-operative Plan: Extubation in OR  Informed Consent: I have reviewed the patients History and Physical, chart, labs and discussed the procedure including the risks, benefits and alternatives for the proposed anesthesia with the patient or authorized representative who has indicated his/her understanding and acceptance.     Dental advisory given  Plan Discussed with:   Anesthesia Plan Comments: (See PAT note 05/06/2022  2 18G IV)       Anesthesia Quick Evaluation

## 2022-05-09 NOTE — Progress Notes (Signed)
Anesthesia Chart Review   Case: [id number] Date/Time: 05/19/22 0715   Procedure: XI ROBOTIC ASSISTED REPAIR OF PARAESOPHAGEAL HIATAL HERNIA WITH FUNDOPLICATION   Anesthesia type: General   Pre-op diagnosis: paraesophageal hiatal hernia   Location[facility] ROOM 02 / [facility]   Surgeons: Dean Boston, MD       DISCUSSION:71 y.o. former smoker with h/o HTN, CAD (DES 08/2021), Factor V Leiden, prostate cancer, paraesophageal hiatal hernia scheduled for above procedure scheduled 05/19/2022 with Dean Guzman.   Pt seen by cardiology 04/12/2022. Per OV note, " Dean Guzman perioperative risk of a major cardiac event is 6.6% according to the Revised Cardiac Risk Index (RCRI).  Therefore, Dean Guzman is at high risk for perioperative complications.   Dean Guzman's functional capacity is good at 5.62 METs according to the Duke Activity Status Index (DASI). Recommendations: According to ACC/AHA guidelines, no further cardiovascular testing needed.  The patient may proceed to surgery at acceptable risk.   Antiplatelet and/or Anticoagulation Recommendations: The patient should remain on Aspirin without interruption."  Anticipate pt can proceed with planned procedure barring acute status change.   VS: BP (!) 147/76   Pulse 61   Temp 36.6 C (Oral)   Resp 16   Ht '6\' 2"'$  (1.88 m)   Wt 108.4 kg   SpO2 98%   BMI 30.69 kg/m   PROVIDERS: Dean Pal, DO is PCP   Primary Electrophysiologist:  Dean Meredith Leeds, MD          LABS: Labs reviewed: Acceptable for surgery. (all labs ordered are listed, but only abnormal results are displayed)  Labs Reviewed  BASIC METABOLIC PANEL - Abnormal; Notable for the following components:      Result Value   Glucose, Bld 113 (*)    All other components within normal limits  CBC     IMAGES:   EKG:   CV: Echo 07/22/2021 1. Left ventricular ejection fraction, by estimation, is 60 to 65%. The  left ventricle has normal function. The left  ventricle has no regional  wall motion abnormalities. Left ventricular diastolic parameters were  normal.   2. Right ventricular systolic function is normal. The right ventricular  size is normal.   3. Left atrial size was mildly dilated.   4. The mitral valve is normal in structure. Mild mitral valve  regurgitation. No evidence of mitral stenosis.   5. The aortic valve is normal in structure. Aortic valve regurgitation is  not visualized. No aortic stenosis is present.   6. The inferior vena cava is normal in size with greater than 50%  respiratory variability, suggesting right atrial pressure of 3 mmHg.  Past Medical History:  Diagnosis Date   Barrett's esophagus    BPH (benign prostatic hyperplasia)    Colon polyps    Complication of anesthesia    during biopsy, felt hot and sweating   Coronary artery disease    stent 4 2023   FH: factor V Leiden mutation    pt tested negatiove    niece died of leiden factor five complications, sister has   Frequent headaches    GERD (gastroesophageal reflux disease)    History of hiatal hernia    History of kidney stones    Hyperlipidemia    Hypertension    Migraines     no recent migraines had visual aura   Neuromuscular disorder (HCC)    neuropathy feet   Pneumonia    Prostate cancer [facility])     Past Surgical History:  Procedure Laterality Date   colonscopy     CORONARY ANGIOGRAPHY N/A 09/08/2021   Procedure: CORONARY ANGIOGRAPHY;  Surgeon: Sherren Mocha, MD;  Location: [place] CV LAB;  Service: Cardiovascular;  Laterality: N/A;   CORONARY STENT INTERVENTION N/A 09/08/2021   Procedure: CORONARY STENT INTERVENTION;  Surgeon: Sherren Mocha, MD;  Location: [place] CV LAB;  Service: Cardiovascular;  Laterality: N/A;   CYSTOSCOPY N/A 01/13/2020   Procedure: CYSTOSCOPY FLEXIBLE;  Surgeon: Lucas Mallow, MD;  Location: [facility];  Service: Urology;  Laterality: N/A;  NO SEEDS FOUND IN BLADDER   ESOPHAGEAL  MANOMETRY N/A 04/28/2022   Procedure: ESOPHAGEAL MANOMETRY (EM);  Surgeon: Wilford Corner, MD;  Location[facility];  Service: Gastroenterology;  Laterality: N/A;   EYE SURGERY     Lasik   INTRAVASCULAR PRESSURE WIRE/FFR STUDY N/A 09/08/2021   Procedure: INTRAVASCULAR PRESSURE WIRE/FFR STUDY;  Surgeon: Sherren Mocha, MD;  Location: [place] CV LAB;  Service: Cardiovascular;  Laterality: N/A;   MASS EXCISION Left 02/08/2021   Procedure: EXCISION MASS OF MUCOID CYST WITH DISTAL INTERPHANGEAL JOINT ARTHROTOMY LEFT MIDDLE FINGER;  Surgeon: [name], MD;  Location: [place];  Service: Orthopedics;  Laterality: Left;   NASAL ENDOSCOPY     PROSTATE BIOPSY  may 2021 and 2020   RADIOACTIVE SEED IMPLANT N/A 01/13/2020   Procedure: RADIOACTIVE SEED IMPLANT/BRACHYTHERAPY IMPLANT;  Surgeon: Lucas Mallow, MD;  Location: [place];  Service: Urology;  Laterality: N/A;    73 SEEDS IMPLANTED   SPACE OAR INSTILLATION N/A 01/13/2020   Procedure: SPACE OAR INSTILLATION;  Surgeon: Lucas Mallow, MD;  Location: [facility];  Service: Urology;  Laterality: N/A;    MEDICATIONS:  amLODipine (NORVASC) 5 MG tablet   aspirin EC 81 MG tablet   cholecalciferol (VITAMIN D3) 25 MCG (1000 UT) tablet   diltiazem (DILACOR XR) 240 MG 24 hr capsule   ibuprofen (ADVIL) 200 MG tablet   ipratropium (ATROVENT) 0.06 % nasal spray   lisinopril (ZESTRIL) 10 MG tablet   nitroGLYCERIN (NITROSTAT) 0.4 MG SL tablet   Omega-3 Fatty Acids (FISH OIL) 1200 MG CAPS   pantoprazole (PROTONIX) 40 MG tablet   rosuvastatin (CRESTOR) 40 MG tablet   Vibegron (GEMTESA) 75 MG TABS    dexamethasone (DECADRON) injection 4 mg    Dean Felix Ward, PA-C [facility] Pre-Surgical Testing [phone]

## 2022-05-19 ENCOUNTER — Encounter (HOSPITAL_COMMUNITY)
Admission: RE | Disposition: A | Discharge status: Home / Self Care | Payer: Self-pay | Source: Ambulatory Visit | Attending: Surgery

## 2022-05-19 ENCOUNTER — Other Ambulatory Visit: Payer: Self-pay

## 2022-05-19 ENCOUNTER — Encounter (HOSPITAL_COMMUNITY): Payer: Self-pay | Admitting: Surgery

## 2022-05-19 ENCOUNTER — Ambulatory Visit (HOSPITAL_COMMUNITY): Payer: PPO | Admitting: Anesthesiology

## 2022-05-19 ENCOUNTER — Ambulatory Visit (HOSPITAL_COMMUNITY): Payer: PPO | Admitting: Physician Assistant

## 2022-05-19 ENCOUNTER — Inpatient Hospital Stay (HOSPITAL_COMMUNITY)
Admission: RE | Admit: 2022-05-19 | Discharge: 2022-05-21 | DRG: 328 | Disposition: A | Discharge status: Home / Self Care | Payer: PPO | Source: Ambulatory Visit | Attending: Surgery | Admitting: Surgery

## 2022-05-19 DIAGNOSIS — K219 Gastro-esophageal reflux disease without esophagitis: Secondary | ICD-10-CM | POA: Diagnosis not present

## 2022-05-19 DIAGNOSIS — K449 Diaphragmatic hernia without obstruction or gangrene: Secondary | ICD-10-CM | POA: Diagnosis not present

## 2022-05-19 DIAGNOSIS — Z7982 Long term (current) use of aspirin: Secondary | ICD-10-CM | POA: Diagnosis not present

## 2022-05-19 DIAGNOSIS — Z87891 Personal history of nicotine dependence: Secondary | ICD-10-CM

## 2022-05-19 DIAGNOSIS — K222 Esophageal obstruction: Secondary | ICD-10-CM | POA: Diagnosis not present

## 2022-05-19 DIAGNOSIS — G4733 Obstructive sleep apnea (adult) (pediatric): Secondary | ICD-10-CM | POA: Diagnosis present

## 2022-05-19 DIAGNOSIS — K2270 Barrett's esophagus without dysplasia: Secondary | ICD-10-CM | POA: Diagnosis not present

## 2022-05-19 DIAGNOSIS — K589 Irritable bowel syndrome without diarrhea: Secondary | ICD-10-CM | POA: Diagnosis present

## 2022-05-19 DIAGNOSIS — I2510 Atherosclerotic heart disease of native coronary artery without angina pectoris: Secondary | ICD-10-CM | POA: Diagnosis not present

## 2022-05-19 DIAGNOSIS — Z955 Presence of coronary angioplasty implant and graft: Secondary | ICD-10-CM

## 2022-05-19 DIAGNOSIS — Z79899 Other long term (current) drug therapy: Secondary | ICD-10-CM

## 2022-05-19 DIAGNOSIS — Z8249 Family history of ischemic heart disease and other diseases of the circulatory system: Secondary | ICD-10-CM | POA: Diagnosis not present

## 2022-05-19 DIAGNOSIS — Z803 Family history of malignant neoplasm of breast: Secondary | ICD-10-CM | POA: Diagnosis not present

## 2022-05-19 DIAGNOSIS — I10 Essential (primary) hypertension: Secondary | ICD-10-CM | POA: Diagnosis present

## 2022-05-19 DIAGNOSIS — Z6830 Body mass index (BMI) 30.0-30.9, adult: Secondary | ICD-10-CM

## 2022-05-19 DIAGNOSIS — Z9889 Other specified postprocedural states: Secondary | ICD-10-CM | POA: Diagnosis not present

## 2022-05-19 DIAGNOSIS — Z923 Personal history of irradiation: Secondary | ICD-10-CM | POA: Diagnosis not present

## 2022-05-19 DIAGNOSIS — E785 Hyperlipidemia, unspecified: Secondary | ICD-10-CM | POA: Diagnosis not present

## 2022-05-19 DIAGNOSIS — E669 Obesity, unspecified: Secondary | ICD-10-CM | POA: Diagnosis present

## 2022-05-19 DIAGNOSIS — Z832 Family history of diseases of the blood and blood-forming organs and certain disorders involving the immune mechanism: Secondary | ICD-10-CM

## 2022-05-19 DIAGNOSIS — J3089 Other allergic rhinitis: Secondary | ICD-10-CM | POA: Diagnosis present

## 2022-05-19 DIAGNOSIS — Z833 Family history of diabetes mellitus: Secondary | ICD-10-CM | POA: Diagnosis not present

## 2022-05-19 DIAGNOSIS — Z8546 Personal history of malignant neoplasm of prostate: Secondary | ICD-10-CM | POA: Diagnosis not present

## 2022-05-19 DIAGNOSIS — K440 Diaphragmatic hernia with obstruction, without gangrene: Secondary | ICD-10-CM | POA: Diagnosis not present

## 2022-05-19 DIAGNOSIS — N400 Enlarged prostate without lower urinary tract symptoms: Secondary | ICD-10-CM | POA: Diagnosis not present

## 2022-05-19 HISTORY — PX: XI ROBOTIC ASSISTED HIATAL HERNIA REPAIR: SHX6889

## 2022-05-19 SURGERY — REPAIR, HERNIA, HIATAL, ROBOT-ASSISTED
Anesthesia: General

## 2022-05-19 MED ORDER — DEXAMETHASONE SODIUM PHOSPHATE 10 MG/ML IJ SOLN
INTRAMUSCULAR | Status: AC
  Filled 2022-05-19: qty 1

## 2022-05-19 MED ORDER — OXYCODONE HCL 5 MG/5ML PO SOLN: 5.0000 mg | Freq: Once | ORAL | Status: DC | PRN

## 2022-05-19 MED ORDER — ONDANSETRON HCL 4 MG/2ML IJ SOLN
INTRAMUSCULAR | Status: DC | PRN
  Administered 2022-05-19: 4 mg via INTRAVENOUS

## 2022-05-19 MED ORDER — AMISULPRIDE (ANTIEMETIC) 5 MG/2ML IV SOLN: 10.0000 mg | Freq: Once | INTRAVENOUS | Status: DC | PRN

## 2022-05-19 MED ORDER — HYDROMORPHONE HCL 1 MG/ML IJ SOLN
INTRAMUSCULAR | Status: AC
  Administered 2022-05-19: 0.5 mg via INTRAVENOUS
  Filled 2022-05-19: qty 1

## 2022-05-19 MED ORDER — LIDOCAINE HCL (CARDIAC) PF 100 MG/5ML IV SOSY
PREFILLED_SYRINGE | INTRAVENOUS | Status: DC | PRN
  Administered 2022-05-19: 80 mg via INTRAVENOUS

## 2022-05-19 MED ORDER — BUPIVACAINE HCL 0.25 % IJ SOLN
INTRAMUSCULAR | Status: AC
  Filled 2022-05-19: qty 2

## 2022-05-19 MED ORDER — METHOCARBAMOL 500 MG PO TABS
500.0000 mg | ORAL_TABLET | Freq: Four times a day (QID) | ORAL | Status: DC | PRN
  Administered 2022-05-19 – 2022-05-20 (×2): 500 mg via ORAL
  Filled 2022-05-19 (×2): qty 1

## 2022-05-19 MED ORDER — METOCLOPRAMIDE HCL 5 MG/ML IJ SOLN: 5.0000 mg | Freq: Three times a day (TID) | INTRAMUSCULAR | Status: DC | PRN

## 2022-05-19 MED ORDER — SODIUM CHLORIDE 0.9% FLUSH
3.0000 mL | INTRAVENOUS | Status: DC | PRN
  Administered 2022-05-21: 3 mL via INTRAVENOUS

## 2022-05-19 MED ORDER — ACETAMINOPHEN 10 MG/ML IV SOLN
INTRAVENOUS | Status: AC
  Administered 2022-05-19: 1000 mg via INTRAVENOUS
  Filled 2022-05-19: qty 100

## 2022-05-19 MED ORDER — PROPOFOL 10 MG/ML IV BOLUS
INTRAVENOUS | Status: AC
  Filled 2022-05-19: qty 20

## 2022-05-19 MED ORDER — LACTATED RINGERS IV SOLN: INTRAVENOUS | Status: DC | PRN

## 2022-05-19 MED ORDER — HYDROMORPHONE HCL 1 MG/ML IJ SOLN
INTRAMUSCULAR | Status: AC
  Administered 2022-05-19: 0.25 mg via INTRAVENOUS
  Filled 2022-05-19: qty 1

## 2022-05-19 MED ORDER — NITROGLYCERIN 0.4 MG SL SUBL: 0.4000 mg | SUBLINGUAL_TABLET | SUBLINGUAL | Status: DC | PRN

## 2022-05-19 MED ORDER — ASPIRIN 81 MG PO TBEC
81.0000 mg | DELAYED_RELEASE_TABLET | Freq: Every day | ORAL | Status: DC
  Administered 2022-05-20 – 2022-05-21 (×2): 81 mg via ORAL
  Filled 2022-05-19 (×2): qty 1

## 2022-05-19 MED ORDER — SODIUM CHLORIDE 0.9 % IV SOLN: INTRAVENOUS | Status: DC

## 2022-05-19 MED ORDER — ACETAMINOPHEN 10 MG/ML IV SOLN: 1000.0000 mg | Freq: Once | INTRAVENOUS | Status: DC | PRN

## 2022-05-19 MED ORDER — ONDANSETRON HCL 4 MG/2ML IJ SOLN
4.0000 mg | Freq: Four times a day (QID) | INTRAMUSCULAR | Status: DC | PRN
  Administered 2022-05-19: 4 mg via INTRAVENOUS
  Filled 2022-05-19: qty 2

## 2022-05-19 MED ORDER — METHOCARBAMOL 1000 MG/10ML IJ SOLN
1000.0000 mg | Freq: Four times a day (QID) | INTRAVENOUS | Status: DC | PRN
  Filled 2022-05-19: qty 10

## 2022-05-19 MED ORDER — ACETAMINOPHEN 500 MG PO TABS
1000.0000 mg | ORAL_TABLET | Freq: Four times a day (QID) | ORAL | Status: DC
  Administered 2022-05-20 – 2022-05-21 (×5): 1000 mg via ORAL
  Filled 2022-05-19 (×6): qty 2

## 2022-05-19 MED ORDER — KETAMINE HCL 10 MG/ML IJ SOLN
INTRAMUSCULAR | Status: DC | PRN
  Administered 2022-05-19 (×3): 10 mg via INTRAVENOUS

## 2022-05-19 MED ORDER — ENSURE PRE-SURGERY PO LIQD: 592.0000 mL | Freq: Once | ORAL | Status: DC

## 2022-05-19 MED ORDER — PROCHLORPERAZINE MALEATE 10 MG PO TABS: 10.0000 mg | ORAL_TABLET | Freq: Four times a day (QID) | ORAL | Status: DC | PRN

## 2022-05-19 MED ORDER — LIDOCAINE HCL (PF) 2 % IJ SOLN
INTRAMUSCULAR | Status: AC
  Filled 2022-05-19: qty 15

## 2022-05-19 MED ORDER — MAGNESIUM HYDROXIDE 400 MG/5ML PO SUSP: 30.0000 mL | Freq: Every day | ORAL | Status: DC | PRN

## 2022-05-19 MED ORDER — LACTATED RINGERS IV BOLUS: 1000.0000 mL | Freq: Three times a day (TID) | INTRAVENOUS | Status: DC | PRN

## 2022-05-19 MED ORDER — ENSURE PRE-SURGERY PO LIQD: 296.0000 mL | Freq: Once | ORAL | Status: DC

## 2022-05-19 MED ORDER — DIPHENHYDRAMINE HCL 12.5 MG/5ML PO ELIX: 12.5000 mg | ORAL_SOLUTION | Freq: Four times a day (QID) | ORAL | Status: DC | PRN

## 2022-05-19 MED ORDER — ONDANSETRON HCL 4 MG/2ML IJ SOLN: 4.0000 mg | Freq: Once | INTRAMUSCULAR | Status: DC | PRN

## 2022-05-19 MED ORDER — ONDANSETRON HCL 4 MG/2ML IJ SOLN
INTRAMUSCULAR | Status: AC
  Filled 2022-05-19: qty 2

## 2022-05-19 MED ORDER — BUPIVACAINE LIPOSOME 1.3 % IJ SUSP
INTRAMUSCULAR | Status: AC
  Filled 2022-05-19: qty 20

## 2022-05-19 MED ORDER — SODIUM CHLORIDE 0.9 % IV SOLN
2.0000 g | INTRAVENOUS | Status: AC
  Administered 2022-05-19: 2 g via INTRAVENOUS
  Filled 2022-05-19: qty 20

## 2022-05-19 MED ORDER — ONDANSETRON HCL 4 MG PO TABS: 4.0000 mg | ORAL_TABLET | Freq: Three times a day (TID) | ORAL | 10 refills | Status: DC | PRN

## 2022-05-19 MED ORDER — CHLORHEXIDINE GLUCONATE CLOTH 2 % EX PADS: 6.0000 | MEDICATED_PAD | Freq: Once | CUTANEOUS | Status: DC

## 2022-05-19 MED ORDER — OXYCODONE HCL 5 MG PO TABS: 5.0000 mg | ORAL_TABLET | Freq: Once | ORAL | Status: DC | PRN

## 2022-05-19 MED ORDER — MIDAZOLAM HCL 2 MG/2ML IJ SOLN
INTRAMUSCULAR | Status: AC
  Filled 2022-05-19: qty 2

## 2022-05-19 MED ORDER — PHENYLEPHRINE HCL (PRESSORS) 10 MG/ML IV SOLN
INTRAVENOUS | Status: AC
  Filled 2022-05-19: qty 1

## 2022-05-19 MED ORDER — 0.9 % SODIUM CHLORIDE (POUR BTL) OPTIME
TOPICAL | Status: DC | PRN
  Administered 2022-05-19: 1000 mL

## 2022-05-19 MED ORDER — SCOPOLAMINE 1 MG/3DAYS TD PT72
1.0000 | MEDICATED_PATCH | TRANSDERMAL | Status: DC
  Administered 2022-05-19: 1.5 mg via TRANSDERMAL
  Filled 2022-05-19: qty 1

## 2022-05-19 MED ORDER — SIMETHICONE 80 MG PO CHEW
40.0000 mg | CHEWABLE_TABLET | Freq: Four times a day (QID) | ORAL | Status: DC | PRN
  Administered 2022-05-19: 40 mg via ORAL
  Filled 2022-05-19 (×2): qty 1

## 2022-05-19 MED ORDER — AMLODIPINE BESYLATE 5 MG PO TABS
5.0000 mg | ORAL_TABLET | Freq: Every day | ORAL | Status: DC
  Administered 2022-05-20 – 2022-05-21 (×2): 5 mg via ORAL
  Filled 2022-05-19 (×2): qty 1

## 2022-05-19 MED ORDER — HYDROMORPHONE HCL 1 MG/ML IJ SOLN
0.2500 mg | INTRAMUSCULAR | Status: DC | PRN
  Administered 2022-05-19: 0.5 mg via INTRAVENOUS
  Administered 2022-05-19: 0.25 mg via INTRAVENOUS
  Administered 2022-05-19: 0.5 mg via INTRAVENOUS

## 2022-05-19 MED ORDER — GABAPENTIN 100 MG PO CAPS
300.0000 mg | ORAL_CAPSULE | Freq: Two times a day (BID) | ORAL | Status: DC
  Administered 2022-05-19 – 2022-05-21 (×4): 300 mg via ORAL
  Filled 2022-05-19 (×4): qty 3

## 2022-05-19 MED ORDER — DEXAMETHASONE SODIUM PHOSPHATE 10 MG/ML IJ SOLN
INTRAMUSCULAR | Status: DC | PRN
  Administered 2022-05-19: 10 mg via INTRAVENOUS

## 2022-05-19 MED ORDER — CHLORHEXIDINE GLUCONATE 0.12 % MT SOLN
15.0000 mL | Freq: Once | OROMUCOSAL | Status: AC
  Administered 2022-05-19: 15 mL via OROMUCOSAL

## 2022-05-19 MED ORDER — FENTANYL CITRATE (PF) 100 MCG/2ML IJ SOLN
INTRAMUSCULAR | Status: DC | PRN
  Administered 2022-05-19: 100 ug via INTRAVENOUS
  Administered 2022-05-19 (×3): 50 ug via INTRAVENOUS

## 2022-05-19 MED ORDER — ENOXAPARIN SODIUM 40 MG/0.4ML IJ SOSY
40.0000 mg | PREFILLED_SYRINGE | INTRAMUSCULAR | Status: DC
  Administered 2022-05-20 – 2022-05-21 (×2): 40 mg via SUBCUTANEOUS
  Filled 2022-05-19 (×2): qty 0.4

## 2022-05-19 MED ORDER — SUGAMMADEX SODIUM 500 MG/5ML IV SOLN
INTRAVENOUS | Status: DC | PRN
  Administered 2022-05-19: 500 mg via INTRAVENOUS

## 2022-05-19 MED ORDER — LACTATED RINGERS IV SOLN: INTRAVENOUS | Status: DC

## 2022-05-19 MED ORDER — METOPROLOL TARTRATE 5 MG/5ML IV SOLN: 5.0000 mg | Freq: Four times a day (QID) | INTRAVENOUS | Status: DC | PRN

## 2022-05-19 MED ORDER — PHENYLEPHRINE HCL-NACL 20-0.9 MG/250ML-% IV SOLN
INTRAVENOUS | Status: DC | PRN
  Administered 2022-05-19 (×2): 50 ug/min via INTRAVENOUS

## 2022-05-19 MED ORDER — BISACODYL 10 MG RE SUPP: 10.0000 mg | Freq: Every day | RECTAL | Status: DC | PRN

## 2022-05-19 MED ORDER — SODIUM CHLORIDE 0.9% FLUSH
3.0000 mL | Freq: Two times a day (BID) | INTRAVENOUS | Status: DC
  Administered 2022-05-20 – 2022-05-21 (×4): 3 mL via INTRAVENOUS

## 2022-05-19 MED ORDER — LIDOCAINE HCL (PF) 2 % IJ SOLN
INTRAMUSCULAR | Status: DC | PRN
  Administered 2022-05-19: 1.5 mg/kg/h via INTRADERMAL

## 2022-05-19 MED ORDER — GABAPENTIN 300 MG PO CAPS
300.0000 mg | ORAL_CAPSULE | ORAL | Status: AC
  Administered 2022-05-19: 300 mg via ORAL
  Filled 2022-05-19: qty 1

## 2022-05-19 MED ORDER — DEXAMETHASONE SODIUM PHOSPHATE 4 MG/ML IJ SOLN: 4.0000 mg | INTRAMUSCULAR | Status: DC

## 2022-05-19 MED ORDER — DEXAMETHASONE SODIUM PHOSPHATE 4 MG/ML IJ SOLN
4.0000 mg | Freq: Two times a day (BID) | INTRAMUSCULAR | Status: DC
  Administered 2022-05-19 – 2022-05-21 (×4): 4 mg via INTRAVENOUS
  Filled 2022-05-19 (×4): qty 1

## 2022-05-19 MED ORDER — ONDANSETRON 4 MG PO TBDP: 4.0000 mg | ORAL_TABLET | Freq: Four times a day (QID) | ORAL | Status: DC | PRN

## 2022-05-19 MED ORDER — OXYCODONE HCL 5 MG PO TABS
5.0000 mg | ORAL_TABLET | ORAL | Status: DC | PRN
  Filled 2022-05-19: qty 1

## 2022-05-19 MED ORDER — ROCURONIUM BROMIDE 100 MG/10ML IV SOLN
INTRAVENOUS | Status: DC | PRN
  Administered 2022-05-19: 20 mg via INTRAVENOUS
  Administered 2022-05-19: 60 mg via INTRAVENOUS
  Administered 2022-05-19 (×2): 20 mg via INTRAVENOUS

## 2022-05-19 MED ORDER — LACTATED RINGERS IV SOLN
INTRAVENOUS | Status: AC | PRN
  Administered 2022-05-19: 1000 mL

## 2022-05-19 MED ORDER — DILTIAZEM HCL 60 MG PO TABS
60.0000 mg | ORAL_TABLET | Freq: Three times a day (TID) | ORAL | Status: DC
  Administered 2022-05-20 – 2022-05-21 (×3): 60 mg via ORAL
  Filled 2022-05-19 (×7): qty 1

## 2022-05-19 MED ORDER — LIP MEDEX EX OINT
TOPICAL_OINTMENT | Freq: Two times a day (BID) | CUTANEOUS | Status: DC
  Administered 2022-05-20: 75 via TOPICAL
  Filled 2022-05-19 (×2): qty 7

## 2022-05-19 MED ORDER — PROPOFOL 10 MG/ML IV BOLUS
INTRAVENOUS | Status: DC | PRN
  Administered 2022-05-19: 130 mg via INTRAVENOUS

## 2022-05-19 MED ORDER — METRONIDAZOLE 500 MG/100ML IV SOLN
500.0000 mg | INTRAVENOUS | Status: AC
  Administered 2022-05-19: 500 mg via INTRAVENOUS
  Filled 2022-05-19: qty 100

## 2022-05-19 MED ORDER — ACETAMINOPHEN 500 MG PO TABS
1000.0000 mg | ORAL_TABLET | ORAL | Status: AC
  Administered 2022-05-19: 1000 mg via ORAL
  Filled 2022-05-19: qty 2

## 2022-05-19 MED ORDER — DIPHENHYDRAMINE HCL 50 MG/ML IJ SOLN: 12.5000 mg | Freq: Four times a day (QID) | INTRAMUSCULAR | Status: DC | PRN

## 2022-05-19 MED ORDER — PROCHLORPERAZINE EDISYLATE 10 MG/2ML IJ SOLN: 5.0000 mg | Freq: Four times a day (QID) | INTRAMUSCULAR | Status: DC | PRN

## 2022-05-19 MED ORDER — HYDROMORPHONE HCL 1 MG/ML IJ SOLN: 0.5000 mg | INTRAMUSCULAR | Status: DC | PRN

## 2022-05-19 MED ORDER — IPRATROPIUM BROMIDE 0.06 % NA SOLN
2.0000 | Freq: Every day | NASAL | Status: DC
  Administered 2022-05-20: 2 via NASAL
  Filled 2022-05-19: qty 15

## 2022-05-19 MED ORDER — KETAMINE HCL 10 MG/ML IJ SOLN
INTRAMUSCULAR | Status: AC
  Filled 2022-05-19: qty 1

## 2022-05-19 MED ORDER — BUPIVACAINE LIPOSOME 1.3 % IJ SUSP: 20.0000 mL | Freq: Once | INTRAMUSCULAR | Status: DC

## 2022-05-19 MED ORDER — TRAMADOL HCL 50 MG PO TABS
50.0000 mg | ORAL_TABLET | Freq: Four times a day (QID) | ORAL | Status: DC | PRN
  Administered 2022-05-19 – 2022-05-21 (×4): 100 mg via ORAL
  Filled 2022-05-19 (×4): qty 2

## 2022-05-19 MED ORDER — BUPIVACAINE LIPOSOME 1.3 % IJ SUSP
INTRAMUSCULAR | Status: DC | PRN
  Administered 2022-05-19: 20 mL

## 2022-05-19 MED ORDER — BUPIVACAINE HCL (PF) 0.25 % IJ SOLN
INTRAMUSCULAR | Status: DC | PRN
  Administered 2022-05-19: 50 mL

## 2022-05-19 MED ORDER — MAGIC MOUTHWASH
15.0000 mL | Freq: Four times a day (QID) | ORAL | Status: DC | PRN
  Administered 2022-05-19: 15 mL via ORAL
  Filled 2022-05-19 (×3): qty 15

## 2022-05-19 MED ORDER — ROCURONIUM BROMIDE 10 MG/ML (PF) SYRINGE
PREFILLED_SYRINGE | INTRAVENOUS | Status: AC
  Filled 2022-05-19: qty 10

## 2022-05-19 MED ORDER — ORAL CARE MOUTH RINSE: 15.0000 mL | Freq: Once | OROMUCOSAL | Status: AC

## 2022-05-19 MED ORDER — ENALAPRILAT 1.25 MG/ML IV SOLN
0.6250 mg | Freq: Four times a day (QID) | INTRAVENOUS | Status: DC | PRN
  Filled 2022-05-19: qty 1

## 2022-05-19 MED ORDER — SODIUM CHLORIDE 0.9 % IV SOLN: 250.0000 mL | INTRAVENOUS | Status: DC | PRN

## 2022-05-19 MED ORDER — TRAMADOL HCL 50 MG PO TABS: 50.0000 mg | ORAL_TABLET | Freq: Four times a day (QID) | ORAL | 0 refills | Status: DC | PRN

## 2022-05-19 MED ORDER — VITAMIN D 25 MCG (1000 UNIT) PO TABS
1000.0000 [IU] | ORAL_TABLET | Freq: Every day | ORAL | Status: DC
  Administered 2022-05-20 – 2022-05-21 (×2): 1000 [IU] via ORAL
  Filled 2022-05-19 (×2): qty 1

## 2022-05-19 MED ORDER — FENTANYL CITRATE (PF) 250 MCG/5ML IJ SOLN
INTRAMUSCULAR | Status: AC
  Filled 2022-05-19: qty 5

## 2022-05-19 MED ORDER — MIDAZOLAM HCL 5 MG/5ML IJ SOLN
INTRAMUSCULAR | Status: DC | PRN
  Administered 2022-05-19: 2 mg via INTRAVENOUS

## 2022-05-19 SURGICAL SUPPLY — 68 items
APPLICATOR COTTON TIP 6 STRL (MISCELLANEOUS) ×1 IMPLANT
APPLICATOR COTTON TIP 6IN STRL (MISCELLANEOUS) ×1
APPLIER CLIP 5 13 M/L LIGAMAX5 (MISCELLANEOUS)
BAG COUNTER SPONGE SURGICOUNT (BAG) ×1 IMPLANT
BLADE SURG SZ11 CARB STEEL (BLADE) ×1 IMPLANT
CHLORAPREP W/TINT 26 (MISCELLANEOUS) ×1 IMPLANT
CLIP APPLIE 5 13 M/L LIGAMAX5 (MISCELLANEOUS) IMPLANT
COVER SURGICAL LIGHT HANDLE (MISCELLANEOUS) ×1 IMPLANT
COVER TIP SHEARS 8 DVNC (MISCELLANEOUS) IMPLANT
COVER TIP SHEARS 8MM DA VINCI (MISCELLANEOUS)
DEFOGGER SCOPE WARMER CLEARIFY (MISCELLANEOUS) IMPLANT
DRAIN CHANNEL 19F RND (DRAIN) IMPLANT
DRAIN PENROSE 0.5X18 (DRAIN) IMPLANT
DRAPE ARM DVNC X/XI (DISPOSABLE) ×4 IMPLANT
DRAPE COLUMN DVNC XI (DISPOSABLE) ×1 IMPLANT
DRAPE DA VINCI XI ARM (DISPOSABLE) ×4
DRAPE DA VINCI XI COLUMN (DISPOSABLE) ×1
DRAPE WARM FLUID 44X44 (DRAPES) ×1 IMPLANT
DRSG TEGADERM 2-3/8X2-3/4 SM (GAUZE/BANDAGES/DRESSINGS) ×5 IMPLANT
DRSG TEGADERM 4X4.75 (GAUZE/BANDAGES/DRESSINGS) IMPLANT
ELECT REM PT RETURN 15FT ADLT (MISCELLANEOUS) ×1 IMPLANT
ENDOLOOP SUT PDS II  0 18 (SUTURE)
ENDOLOOP SUT PDS II 0 18 (SUTURE) IMPLANT
EVACUATOR DRAINAGE 10X20 100CC (DRAIN) IMPLANT
EVACUATOR SILICONE 100CC (DRAIN) IMPLANT
FELT TEFLON 4 X1 (Mesh General) ×1 IMPLANT
GAUZE SPONGE 2X2 8PLY STRL LF (GAUZE/BANDAGES/DRESSINGS) ×1 IMPLANT
GLOVE ECLIPSE 8.0 STRL XLNG CF (GLOVE) ×2 IMPLANT
GLOVE INDICATOR 8.0 STRL GRN (GLOVE) ×2 IMPLANT
GOWN STRL REUS W/ TWL XL LVL3 (GOWN DISPOSABLE) ×4 IMPLANT
GOWN STRL REUS W/TWL XL LVL3 (GOWN DISPOSABLE) ×4
GRASPER SUT TROCAR 14GX15 (MISCELLANEOUS) IMPLANT
IRRIG SUCT STRYKERFLOW 2 WTIP (MISCELLANEOUS) ×1
IRRIGATION SUCT STRKRFLW 2 WTP (MISCELLANEOUS) ×1 IMPLANT
KIT BASIN OR (CUSTOM PROCEDURE TRAY) ×1 IMPLANT
KIT TURNOVER KIT A (KITS) IMPLANT
MESH BIO-A 7X10 SYN MAT (Mesh General) IMPLANT
MESH PHASIX RESORB RECT 10X15 (Mesh General) IMPLANT
NEEDLE HYPO 22GX1.5 SAFETY (NEEDLE) ×1 IMPLANT
PACK CARDIOVASCULAR III (CUSTOM PROCEDURE TRAY) ×1 IMPLANT
PAD POSITIONING PINK XL (MISCELLANEOUS) ×1 IMPLANT
PENCIL SMOKE EVACUATOR (MISCELLANEOUS) IMPLANT
POUCH RETRIEVAL ECOSAC 10 (ENDOMECHANICALS) IMPLANT
POUCH RETRIEVAL ECOSAC 10MM (ENDOMECHANICALS) ×1
SCISSORS LAP 5X45 EPIX DISP (ENDOMECHANICALS) IMPLANT
SEAL CANN UNIV 5-8 DVNC XI (MISCELLANEOUS) ×4 IMPLANT
SEAL XI 5MM-8MM UNIVERSAL (MISCELLANEOUS) ×4
SEALER VESSEL DA VINCI XI (MISCELLANEOUS) ×1
SEALER VESSEL EXT DVNC XI (MISCELLANEOUS) ×1 IMPLANT
SOLUTION ELECTROLUBE (MISCELLANEOUS) IMPLANT
SPIKE FLUID TRANSFER (MISCELLANEOUS) ×1 IMPLANT
STOPCOCK 4 WAY LG BORE MALE ST (IV SETS) ×2 IMPLANT
SUT ETHIBOND 0 36 GRN (SUTURE) ×2 IMPLANT
SUT ETHIBOND NAB CT1 #1 30IN (SUTURE) ×3 IMPLANT
SUT MNCRL AB 4-0 PS2 18 (SUTURE) ×1 IMPLANT
SUT PROLENE 2 0 SH DA (SUTURE) IMPLANT
SUT VICRYL 0 TIES 12 18 (SUTURE) IMPLANT
SUT VICRYL 0 UR6 27IN ABS (SUTURE) IMPLANT
SUT VLOC 180 2-0 6IN GS21 (SUTURE) IMPLANT
SUT VLOC 180 2-0 9IN GS21 (SUTURE) IMPLANT
SYR 10ML LL (SYRINGE) ×1 IMPLANT
SYR 20ML LL LF (SYRINGE) ×1 IMPLANT
TOWEL OR 17X26 10 PK STRL BLUE (TOWEL DISPOSABLE) ×1 IMPLANT
TOWEL OR NON WOVEN STRL DISP B (DISPOSABLE) ×1 IMPLANT
TRAY FOLEY MTR SLVR 14FR STAT (SET/KITS/TRAYS/PACK) IMPLANT
TRAY FOLEY MTR SLVR 16FR STAT (SET/KITS/TRAYS/PACK) IMPLANT
TROCAR ADV FIXATION 5X100MM (TROCAR) ×1 IMPLANT
TUBING INSUFFLATION 10FT LAP (TUBING) ×1 IMPLANT

## 2022-05-19 NOTE — Anesthesia Procedure Notes (Signed)
Procedure Name: Intubation Date/Time: 05/19/2022 7:45 AM  Performed by: Jonna Munro, CRNAPre-anesthesia Checklist: Patient identified, Emergency Drugs available, Suction available, Patient being monitored and Timeout performed Patient Re-evaluated:Patient Re-evaluated prior to induction Oxygen Delivery Method: Circle system utilized Preoxygenation: Pre-oxygenation with 100% oxygen Induction Type: IV induction Ventilation: Oral airway inserted - appropriate to patient size Laryngoscope Size: Mac and 4 Grade View: Grade I Tube type: Oral Tube size: 7.5 mm Number of attempts: 1 Airway Equipment and Method: Stylet Placement Confirmation: ETT inserted through vocal cords under direct vision, positive ETCO2, CO2 detector and breath sounds checked- equal and bilateral Secured at: 23 cm Tube secured with: Tape Dental Injury: Teeth and Oropharynx as per pre-operative assessment

## 2022-05-19 NOTE — H&P (Signed)
05/19/2022    REFERRING PHYSICIAN: Lear Ng, MD  Patient Care Team: Shelda Pal, DO as PCP - General (Family Medicine) Johney Maine, Adrian Saran, MD as Consulting Provider (General Surgery) Lear Ng, MD (Gastroenterology) Park Liter, MD (Cardiovascular Disease)  PROVIDER: Hollace Kinnier, MD  [facility] MRN: [id number] DOB: [birth year]  SUBJECTIVE  Chief Complaint: Hernia   History of Present Illness: Dean Guzman is a 72 y.o. male who is seen today as an office consultation at the request of Dr. Michail Sermon for evaluation of Hernia . Pleasant patient. He has had known heartburn and reflux for many years. Followed by [facility] gastroenterology. Had endoscopy in 2021 by Dr. Michail Sermon. Found to have short segment of Barrett's without dysplasia and moderate size hiatal hernia. Has been on Protonix for many many years. Now on twice a day. Patient has had issues with refluxing of fluid and regurgitation when he lies supine or bends over. He sleeps on pillows. He avoids nightly meals. Does not complain much in the way of dysphagia or early satiety. No major bloating or belching. Moves his bowels usually once a day.  However he had an episode of significant retching and ended up come emergency department with evidence of pneumonia in August. He stabilized and avoid admission. Was treated on antibiotics. However that was concerning. He has noticed worsening reflux episodes. Based concerns surgical consultation offered. Patient does have coronary disease and did have stenting done in April by Dr. Selinda Flavin. He was on Plavix for 6 months and just stopped that last month. Patient claims he can walk at least 1/2-hour without difficulty. No exertional chest pain or shortness of breath. He has not smoked tobacco for 40 years. No sleep apnea. No diabetes.  Medical History:  Past Medical History: Diagnosis Date GERD (gastroesophageal reflux disease) History of  cancer Hyperlipidemia Hypertension  Patient Active Problem List Diagnosis Barrett's esophagus without dysplasia Incarcerated hiatal hernia  Past Surgical History: Procedure Laterality Date cardiac stent PROSTATE SURGERY   No Known Allergies  Current Outpatient Medications on File Prior to Visit Medication Sig Dispense Refill amLODIPine (NORVASC) 5 MG tablet Take 1 tablet by mouth once daily dilTIAZem (DILT-XR) 240 MG XR capsule Take 1 capsule by mouth once daily ipratropium (ATROVENT) 0.06 % nasal spray Place 2 sprays into one nostril 3 (three) times daily as needed lisinopriL (ZESTRIL) 10 MG tablet Take 1 tablet by mouth once daily pantoprazole (PROTONIX) 40 MG DR tablet Take 1 tablet by mouth 2 (two) times daily rosuvastatin (CRESTOR) 40 MG tablet Take 40 mg by mouth once daily aspirin 81 MG EC tablet Take 1 tablet by mouth once daily cholecalciferol (VITAMIN D3) 1000 unit tablet Take by mouth GEMTESA 75 mg Tab Take 1 tablet by mouth once daily omega-3 fatty acids-fish oil 360-1,200 mg Cap Take by mouth  No current facility-administered medications on file prior to visit.  Family History Problem Relation Age of Onset High blood pressure (Hypertension) Father Hyperlipidemia (Elevated cholesterol) Father Coronary Artery Disease (Blocked arteries around heart) Father Breast cancer Sister   Social History  Tobacco Use Smoking Status Former Types: Cigarettes Quit date: 1980 Years since quitting: 43.8 Smokeless Tobacco Not on file   Social History  Socioeconomic History Marital status: Married Tobacco Use Smoking status: Former Types: Cigarettes Quit date: 1980 Years since quitting: 43.8 Substance and Sexual Activity Alcohol use: Yes Drug use: Never  ############################################################  Review of Systems: A complete review of systems (ROS) was obtained from the patient. I have reviewed this  information and discussed as appropriate  with the patient. See HPI as well for other pertinent ROS.  Constitutional: No fevers, chills, sweats. Weight stable Eyes: No vision changes, No discharge HENT: No sore throats, nasal drainage Lymph: No neck swelling, No bruising easily Pulmonary: No cough, productive sputum CV: No orthopnea, PND . No exertional chest/neck/shoulder/arm pain. Patient can walk 30 minutes without difficulty.  GI: No personal nor family history of GI/colon cancer, inflammatory bowel disease, irritable bowel syndrome, allergy such as Celiac Sprue, dietary/dairy problems, colitis, ulcers nor gastritis. No recent sick contacts/gastroenteritis. No travel outside the country. No changes in diet.  Renal: No UTIs, No hematuria Genital: No drainage, bleeding, masses Musculoskeletal: No severe joint pain. Good ROM major joints Skin: No sores or lesions Heme/Lymph: No easy bleeding. No swollen lymph nodes Neuro: No active seizures. No facial droop Psych: No hallucinations. No agitation  OBJECTIVE  Vitals: 03/30/22 1412 BP: (!) 146/82 Pulse: 90 Temp: 36.7 C (98 F) SpO2: 96% Weight: (!) 107.7 kg (237 lb 6.4 oz) Height: 188 cm ('6\' 2"'$ )  Body mass index is 30.48 kg/m.  PHYSICAL EXAM:  Constitutional: Not cachectic. Hygeine adequate. Vitals signs as above. Eyes: Wears glasses - vision corrected,Pupils reactive, normal extraocular movements. Sclera nonicteric Neuro: CN II-XII intact. No major focal sensory defects. No major motor deficits. Lymph: No head/neck/groin lymphadenopathy Psych: No severe agitation. No severe anxiety. Judgment & insight Adequate, Oriented x4, HENT: Normocephalic, Mucus membranes moist. No thrush. Hearing: adequate Neck: Supple, No tracheal deviation. No obvious thyromegaly Chest: No pain to chest wall compression. Good respiratory excursion. No audible wheezing CV: Pulses intact. regular. No major extremity edema Ext: No obvious deformity or contracture. Edema: Not present. No  cyanosis Skin: No major subcutaneous nodules. Warm and dry Musculoskeletal: Severe joint rigidity not present. No obvious clubbing. No digital petechiae. Mobility: no assist device moving easily without restrictions  Abdomen: Obese Soft. Nondistended. Nontender. Hernia: Not present. Diastasis recti: Not present. No hepatomegaly. No splenomegaly.  Genital/Pelvic: Inguinal hernia: Not present. Inguinal lymph nodes: without lymphadenopathy nor hidradenitis.  Rectal: (Deferred)    ###################################################################  Labs, Imaging and Diagnostic Testing:  Located in 'Care Everywhere' section of Epic EMR chart  PRIOR [facility] CLINIC NOTES:  Not applicable  SURGERY NOTES:  Not applicable  PATHOLOGY:  Located in [place]' section of Epic EMR chart  Assessment and Plan: DIAGNOSES:  Diagnoses and all orders for this visit:  Incarcerated hiatal hernia  Barrett's esophagus without dysplasia    ASSESSMENT/PLAN  Pleasant active male with known hiatal hernia for many years and short segment Barrett's esophagus followed by [facility] gastroenterology.  While heartburn symptoms are controlled with his proton pump under inhibitors and antiacid medications, he is having worsening episodes of reflux and recently had an episode of aspiration pneumonia in August which concerned him. Given these events Dr. Michail Sermon, his gastroenterologist, recommended surgical valuation.  I think he would benefit from surgery to reduce and repair his hiatal hernia. Min invasive robotic approach. With his short segment Barrett's, it would be nice to be able to do a complete Nissen fundoplication.  Manometry WNL  The anatomy & physiology of the foregut and anti-reflux mechanism was discussed. The pathophysiology of hiatal herniation and GERD was discussed. Natural history risks without surgery was discussed. The patient's symptoms are not adequately controlled by medicines and  other non-operative treatments. I feel the risks of no intervention will lead to serious problems that outweigh the operative risks; therefore, I recommended surgery to reduce the hiatal hernia  out of the chest and fundoplication to rebuild the anti-reflux valve and control reflux better. Need for a thorough workup to rule out the differential diagnosis and plan treatment was explained. I explained minimally invasive techniques with possible need for an open approach.  Risks such as bleeding, infection, abscess, leak,injury to other organs, need for repair of tissues / organs, need for further treatment, stroke, heart attack, death, and other risks were discussed. I noted a good likelihood this will help address the problem. Goals of post-operative recovery were discussed as well. Possibility that this will not correct all symptoms was explained. Post-operative dysphagia, need for short-term liquid & pureed diet, inability to vomit, possibility of reherniation, possible need for medicines to help control symptoms in addition to surgery were discussed. We will work to minimize complications. Educational handouts further explaining the pathology, treatment options, and dysphagia diet was given as well. Questions were answered. The patient expresses understanding & wishes to proceed with surgery.   Adin Hector, MD, FACS, MASCRS Esophageal, Gastrointestinal & Colorectal Surgery Robotic and Minimally Invasive Surgery  [facility] [address], [place], [place] [address] [phone] Fax [phone] Main  CONTACT INFORMATION:  Weekday (9AM-5PM): Call [facility] main office at [phone]  Weeknight (5PM-9AM) or Weekend/Holiday: Check [email or web address] (password " [email or web address]") for General Surgery [facility] coverage  (Please, do not use SecureChat as it is not reliable communication to reach operating surgeons for immediate patient care given  surgeries/outpatient duties/clinic/cross-coverage/off post-call which would lead to a delay in care.  Epic staff messaging available for outptient concerns, but may not be answered for 48 hours or more).    05/19/2022

## 2022-05-19 NOTE — Progress Notes (Signed)
Pt refused CPAP qhs.  Pt states that he does not use a CPAP at home and does not want to use one here either.

## 2022-05-19 NOTE — Discharge Instructions (Signed)
EATING AFTER YOUR ESOPHAGEAL SURGERY (Stomach Fundoplication, Hiatal Hernia repair, Achalasia surgery, etc)  ######################################################################  EAT Start with a pureed / full liquid diet (see below) Gradually transition to a high fiber diet with a fiber supplement over the next month after discharge.    WALK Walk an hour a day.  Control your pain to do that.    CONTROL PAIN Control pain so that you can walk, sleep, tolerate sneezing/coughing, go up/down stairs.  HAVE A BOWEL MOVEMENT DAILY Keep your bowels regular to avoid problems.  OK to try a laxative to override constipation.  OK to use an antidairrheal to slow down diarrhea.  Call if not better after 2 tries  CALL IF YOU HAVE PROBLEMS/CONCERNS Call if you are still struggling despite following these instructions. Call if you have concerns not answered by these instructions  ######################################################################   After your esophageal surgery, expect some sticking with swallowing over the next 1-2 months.    If food sticks when you eat, it is called "dysphagia".  This is due to swelling around your esophagus at the wrap & hiatal diaphragm repair.  It will gradually ease off over the next few months.  To help you through this temporary phase, we start you out on a pureed (blenderized) diet.  Your first meal in the hospital was thin liquids.  You should have been given a pureed diet by the time you left the hospital.  We ask patients to stay on a pureed diet for the first 2-3 weeks to avoid anything getting "stuck" near your recent surgery.  Don't be alarmed if your ability to swallow doesn't progress according to this plan.  Everyone is different and some diets can advance more or less quickly.    It is often helpful to crush your medications or split them as they can sometimes stick, especially the first week or so.   Some BASIC RULES to follow  are:  Maintain an upright position whenever eating or drinking.  Take small bites - just a teaspoon size bite at a time.  Eat slowly.  It may also help to eat only one food at a time.  Consider nibbling through smaller, more frequent meals & avoid the urge to eat BIG meals  Do not push through feelings of fullness, nausea, or bloatedness  Do not mix solid foods and liquids in the same mouthful  Try not to "wash foods down" with large gulps of liquids.  Avoid carbonated (bubbly/fizzy) drinks.    Avoid foods that make you feel gassy or bloated.  Start with bland foods first.  Wait on trying greasy, fried, or spicy meals until you are tolerating more bland solids well.  Understand that it will be hard to burp and belch at first.  This gradually improves with time.  Expect to be more gassy/flatulent/bloated initially.  Walking will help your body manage it better.  Consider using medications for bloating that contain simethicone such as  Maalox or Gas-X   Consider crushing her medications, especially smaller pills.  The ability to swallow pills should get easier after a few weeks  Eat in a relaxed atmosphere & minimize distractions.  Avoid talking while eating.    Do not use straws.  Following each meal, sit in an upright position (90 degree angle) for 60 to 90 minutes.  Going for a short walk can help as well  If food does stick, don't panic.  Try to relax and let the food pass on its own.    Be gradual in changes & use common sense:  -If you easily tolerating a certain "level" of foods, advance to the next level gradually -If you are having trouble swallowing a particular food, then avoid it.   -If food is sticking when you advance your diet, go back to thinner previous diet (the lower LEVEL) for 1-2 days.  LEVEL 1 = PUREED DIET  Do for the first 2 WEEKS AFTER SURGERY  -Foods in this group are pureed or blenderized to a smooth, mashed  potato-like consistency.  -If necessary, the pureed foods can keep their shape with the addition of a thickening agent.   -Meat should be pureed to a smooth, pasty consistency.  Hot broth or gravy may be added to the pureed meat, approximately 1 oz. of liquid per 3 oz. serving of meat. -CAUTION:  If any foods do not puree into a smooth consistency, swallowing will be more difficult.  (For example, nuts or seeds sometimes do not blend well.)  Hot Foods Cold Foods  Pureed scrambled eggs and cheese Pureed cottage cheese  Baby cereals Thickened juices and nectars  Thinned cooked cereals (no lumps) Thickened milk or eggnog  Pureed [place] toast or pancakes Ensure  Mashed potatoes Ice cream  Pureed parsley, au gratin, scalloped potatoes, candied sweet potatoes Fruit or [place] ice, sherbet  Pureed buttered or alfredo noodles Plain yogurt  Pureed vegetables (no corn or peas) Instant breakfast  Pureed soups and creamed soups Smooth pudding, mousse, custard  Pureed scalloped apples Whipped gelatin  Gravies Sugar, syrup, honey, jelly  Sauces, cheese, tomato, barbecue, white, creamed Cream  Any baby food Creamer  Alcohol in moderation (not beer or champagne) Margarine  Coffee or tea Mayonnaise   Ketchup, mustard   Apple sauce   SAMPLE MENU:  PUREED DIET Breakfast Lunch Dinner  Orange juice, 1/2 cup Cream of wheat, 1/2 cup Pineapple juice, 1/2 cup Pureed [place], barley soup, 3/4 cup Pureed Hawaiian chicken, 3 oz  Scrambled eggs, mashed or blended with cheese, 1/2 cup Tea or coffee, 1 cup  Whole milk, 1 cup  Non-dairy creamer, 2 Tbsp. Mashed potatoes, 1/2 cup Pureed cooled broccoli, 1/2 cup Apple sauce, 1/2 cup Coffee or tea Mashed potatoes, 1/2 cup Pureed spinach, 1/2 cup Frozen yogurt, 1/2 cup Tea or coffee      LEVEL 2 = SOFT DIET  After your first 2 weeks, you can advance to a soft diet.   Keep on this diet until everything goes down easily.  Hot Foods Cold Foods  White fish  Cottage cheese  Stuffed fish Junior baby fruit  Baby food meals Semi thickened juices  Minced soft cooked, scrambled, poached eggs nectars  Souffle & omelets Ripe mashed bananas  Cooked cereals Canned fruit, pineapple sauce, milk  potatoes Milkshake  Buttered or Alfredo noodles Custard  Cooked cooled vegetable Puddings, including tapioca  Sherbet Yogurt  Vegetable soup or alphabet soup Fruit ice, [place] ice  Gravies Whipped gelatin  Sugar, syrup, honey, jelly Junior baby desserts  Sauces:  Cheese, creamed, barbecue, tomato, white Cream  Coffee or tea Margarine   SAMPLE MENU:  LEVEL 2 Breakfast Lunch Dinner  Orange juice, 1/2 cup Oatmeal, 1/2 cup Scrambled eggs with cheese, 1/2 cup Decaffeinated tea, 1 cup Whole milk, 1 cup Non-dairy creamer, 2 Tbsp Pineapple juice, 1/2 cup Minced beef, 3 oz Gravy, 2 Tbsp Mashed potatoes, 1/2 cup Minced fresh broccoli, 1/2 cup Applesauce, 1/2 cup Coffee, 1 cup [place], barley soup, 3/4 cup Minced Hawaiian chicken, 3 oz  Non-dairy creamer, 2 Tbsp  Pineapple juice, 1/2 cup  Minced beef, 3 oz  Gravy, 2 Tbsp  Mashed potatoes, 1/2 cup  Minced fresh broccoli, 1/2 cup  Applesauce, 1/2 cup  Coffee, 1 cup  [place], barley soup, 3/4 cup  Minced Hawaiian chicken, 3 oz  Mashed potatoes, 1/2 cup  Cooked spinach, 1/2 cup  Frozen yogurt, 1/2 cup  Non-dairy creamer, 2 Tbsp      LEVEL 3 = CHOPPED DIET  -After all the foods in level 2 (soft diet) are passing through well you should advance up to more chopped foods.  -It is still important to cut these foods into small pieces and eat slowly.  Hot Foods Cold Foods  Poultry Cottage cheese  Chopped Swedish meatballs Yogurt  Meat salads (ground or flaked meat) Milk  Flaked fish (tuna) Milkshakes  Poached or scrambled eggs Soft, cold, dry cereal  Souffles and omelets Fruit juices or nectars  Cooked cereals Chopped canned fruit  Chopped French toast or pancakes Canned fruit cocktail  Noodles or pasta (no rice) Pudding, mousse, custard  Cooked vegetables (no frozen peas, corn, or mixed vegetables) Green salad  Canned small sweet peas  Ice cream  Creamed soup or vegetable soup Fruit ice, Italian ice  Pureed vegetable soup or alphabet soup Non-dairy creamer  Ground scalloped apples Margarine  Gravies Mayonnaise  Sauces:  Cheese, creamed, barbecue, tomato, white Ketchup  Coffee or tea Mustard   SAMPLE MENU:  LEVEL 3 Breakfast Lunch Dinner   Orange juice, 1/2 cup  Oatmeal, 1/2 cup  Scrambled eggs with cheese, 1/2 cup  Decaffeinated tea, 1 cup  Whole milk, 1 cup  Non-dairy creamer, 2 Tbsp  Ketchup, 1 Tbsp  Margarine, 1 tsp  Salt, 1/4 tsp  Sugar, 2 tsp  Pineapple juice, 1/2 cup  Ground beef, 3 oz  Gravy, 2 Tbsp  Mashed potatoes, 1/2 cup  Cooked spinach, 1/2 cup  Applesauce, 1/2 cup  Decaffeinated coffee  Whole milk  Non-dairy creamer, 2 Tbsp  Margarine, 1 tsp  Salt, 1/4 tsp  Pureed turkey, barley soup, 3/4 cup  Barbecue chicken, 3 oz  Mashed potatoes, 1/2 cup  Ground fresh broccoli, 1/2 cup  Frozen yogurt, 1/2 cup  Decaffeinated tea, 1 cup  Non-dairy creamer, 2 Tbsp  Margarine, 1 tsp  Salt, 1/4 tsp  Sugar, 1 tsp    LEVEL 4:  REGULAR FOODS  -Foods in this group are soft, moist, regularly textured foods.   -This level includes meat and breads, which tend to be the hardest things to swallow.   -Eat very slowly, chew well and continue to avoid carbonated drinks. -most people are at this level in 4-6 weeks  Hot Foods Cold Foods  Baked fish or skinned Soft cheeses - cottage cheese  Souffles and omelets Cream cheese  Eggs Yogurt  Stuffed shells Milk  Spaghetti with meat sauce Milkshakes  Cooked cereal Cold dry cereals (no nuts, dried fruit, coconut)  French toast or pancakes Crackers  Buttered toast Fruit juices or nectars  Noodles or pasta (no rice) Canned fruit  Potatoes (all types) Ripe bananas  Soft, cooked vegetables (no corn, lima, or baked beans) Peeled, ripe, fresh fruit  Creamed soups or vegetable soup Cakes (no nuts, dried fruit, coconut)  Canned chicken  noodle soup Plain doughnuts  Gravies Ice cream  Bacon dressing Pudding, mousse, custard  Sauces:  Cheese, creamed, barbecue, tomato, white Fruit ice, Italian ice, sherbet  Decaffeinated tea or coffee Whipped gelatin  Pork chops Regular gelatin     as needed  6) May hold gluten/wheat products from diet to see if symptoms improve.  7) May try probiotics (Align, Activa, etc) to help calm the bowels down  7) If symptoms become worse call back immediately.    If you have any questions please call our office at [place]: [phone].    ################################################################  LAPAROSCOPIC SURGERY: POST OP INSTRUCTIONS  ######################################################################  EAT Gradually transition to a high fiber diet with a fiber supplement over the next few weeks after discharge.  Start with a pureed / full liquid diet (see below)  WALK Walk an hour a day.  Control your pain to do that.    CONTROL PAIN Control pain so that you can walk, sleep, tolerate sneezing/coughing, go up/down stairs.  HAVE A BOWEL MOVEMENT DAILY Keep your bowels regular to avoid problems.  OK to try a laxative to override constipation.  OK to use an antidairrheal to slow down diarrhea.  Call if not better after 2 tries  CALL IF YOU HAVE PROBLEMS/CONCERNS Call if you are still struggling despite following these instructions. Call if you have concerns not answered by these instructions  ######################################################################    DIET: Follow the ESOPHAGEAL SURGERY instructions above  Take your usually prescribed home medications  unless otherwise directed.  PAIN CONTROL: Pain is best controlled by a usual combination of three different methods TOGETHER: Ice/Heat Over the counter pain medication Prescription pain medication Most patients will experience some swelling and bruising around the incisions.  Ice packs or heating pads (30-60 minutes up to 6 times a day) will help. Use ice for the first few days to help decrease swelling and bruising, then switch to heat to help relax tight/sore spots and speed recovery.  Some people prefer to use ice alone, heat alone, alternating between ice & heat.  Experiment to what works for you.  Swelling and bruising can take several weeks to resolve.   It is helpful to take an over-the-counter pain medication regularly for the first few weeks.  Choose one of the following that works best for you: Naproxen (Aleve, etc)  Two '220mg'$  tabs twice a day Ibuprofen (Advil, etc) Three '200mg'$  tabs four times a day (every meal & bedtime) Acetaminophen (Tylenol, etc) 500-'650mg'$  four times a day (every meal & bedtime) A  prescription for pain medication (such as oxycodone, hydrocodone, tramadol, gabapentin, methocarbamol, etc) should be given to you upon discharge.  Take your pain medication as prescribed.  If you are having problems/concerns with the prescription medicine (does not control pain, nausea, vomiting, rash, itching, etc), please call us [phone] to see if we need to switch you to a different pain medicine that will work better for you and/or control your side effect better. If you need a refill on your pain medication, please give [place] 48 hour notice.  contact your pharmacy.  They will contact our office to request authorization. Prescriptions will not be filled after 5 pm or on week-ends  Avoid getting constipated.   Between the surgery and the pain medications, it is common to experience some constipation.   Increasing fluid intake and taking a fiber supplement (such as Metamucil,  Citrucel, FiberCon, MiraLax, etc) 1-2 times a day regularly will usually help prevent this problem from occurring.   A mild laxative (prune juice, Milk of Magnesia, MiraLax, etc) should be taken according to package directions if there are no bowel movements after 48 hours.   Watch out for diarrhea.   If you have many loose  bowel movements, simplify your diet to bland foods & liquids for a few days.   Stop any stool softeners and decrease your fiber supplement.   Switching to mild anti-diarrheal medications (Kayopectate, Pepto Bismol) can help.   If this worsens or does not improve, please call us.  Wash / shower every day.  You may shower over the dressings as they are waterproof.  Continue to shower over incision(s) after the dressing is off.  It is good for closed incisions and even open wounds to be washed every day.  Shower every day.  Short baths are fine.  Wash the incisions and wounds clean with soap & water.    You may leave closed incisions open to air if it is dry.   You may cover the incision with clean gauze & replace it after your daily shower for comfort.  TEGADERM:  You have clear gauze band-aid dressings over your closed incision(s).  Remove the dressings 3 days after surgery.= 12/24    ACTIVITIES as tolerated:   You may resume regular (light) daily activities beginning the next day--such as daily self-care, walking, climbing stairs--gradually increasing activities as tolerated.  If you can walk 30 minutes without difficulty, it is safe to try more intense activity such as jogging, treadmill, bicycling, low-impact aerobics, swimming, etc. Save the most intensive and strenuous activity for last such as sit-ups, heavy lifting, contact sports, etc  Refrain from any heavy lifting or straining until you are off narcotics for pain control.   DO NOT PUSH THROUGH PAIN.  Let pain be your guide: If it hurts to do something, don't do it.  Pain is your body warning you to avoid that activity  for another week until the pain goes down. You may drive when you are no longer taking prescription pain medication, you can comfortably wear a seatbelt, and you can safely maneuver your car and apply brakes. You may have sexual intercourse when it is comfortable.  FOLLOW UP in our office Please call [facility] at [phone] to set up an appointment to see your surgeon in the office for a follow-up appointment approximately 2-3 weeks after your surgery. Make sure that you call for this appointment the day you arrive home to insure a convenient appointment time.  10. IF YOU HAVE DISABILITY OR FAMILY LEAVE FORMS, BRING THEM TO THE OFFICE FOR PROCESSING.  DO NOT GIVE THEM TO YOUR DOCTOR.   WHEN TO CALL us [phone]: Poor pain control Reactions / problems with new medications (rash/itching, nausea, etc)  Fever over 101.5 F (38.5 C) Inability to urinate Nausea and/or vomiting Worsening swelling or bruising Continued bleeding from incision. Increased pain, redness, or drainage from the incision   The clinic staff is available to answer your questions during regular business hours (8:30am-5pm).  Please don't hesitate to call and ask to speak to one of our nurses for clinical concerns.   If you have a medical emergency, go to the nearest emergency room or call 911.  A surgeon from [facility] is always on call at the [facility], [place], [place], [place], [place]  [id number] ? MAIN: [phone] ? TOLL FREE: [phone] ?  FAX [phone] [email or web address]  ##############################################################

## 2022-05-19 NOTE — Interval H&P Note (Signed)
History and Physical Interval Note:  05/19/2022 7:19 AM  Dean Guzman  has presented today for surgery, with the diagnosis of paraesophageal hiatal hernia.  The various methods of treatment have been discussed with the patient and family. After consideration of risks, benefits and other options for treatment, the patient has consented to  Procedure(s): XI ROBOTIC ASSISTED REPAIR OF PARAESOPHAGEAL HIATAL HERNIA WITH FUNDOPLICATION (N/A) as a surgical intervention.  The patient's history has been reviewed, patient examined, no change in status, stable for surgery.  I have reviewed the patient's chart and labs.  Questions were answered to the patient's satisfaction.    I have re-reviewed the the patient's records, history, medications, and allergies.  I have re-examined the patient.  I again discussed intraoperative plans and goals of post-operative recovery.  The patient agrees to proceed.  Dean Guzman  1950-09-27 [id number]  Patient Care Team: Shelda Pal, DO as PCP - General (Family Medicine)  Patient Active Problem List   Diagnosis Date Noted   Dyspnea on exertion 12/21/2021   Paroxysmal ventricular tachycardia ([place]) 09/08/2021   CAD (coronary artery disease) PTCA and stenting of the mid LAD in April 2023 09/08/2021   Change in bowel habit 08/31/2021   Irritable bowel syndrome 08/31/2021   Internal hemorrhoids 08/31/2021   Imaging of gastrointestinal tract abnormal 08/31/2021   Radiation proctitis 08/31/2021   Other general symptoms and signs 08/31/2021   Mucus in stool 08/31/2021   Right lower quadrant pain 08/31/2021   Atypical chest pain 08/31/2021   Near syncope 07/21/2021   Migraines 07/16/2021   History of kidney stones 07/16/2021   Frequent headaches 07/16/2021   FH: factor V Leiden mutation 50/01/3817   Complication of anesthesia 07/16/2021   Colon polyps 07/16/2021   BPH (benign prostatic hyperplasia) 07/16/2021   Barrett's esophagus 07/16/2021    Myalgia due to statin 09/24/2019   Perennial allergic rhinitis 12/25/2018   GERD (gastroesophageal reflux disease) 12/25/2018   Enteritis 08/01/2018   Ileitis 08/01/2018   Cough 06/14/2018   Throat irritation 06/14/2018   Essential hypertension 04/20/2018   Hyperlipidemia 04/20/2018   Post-nasal drainage 04/20/2018   Numbness of toes 04/20/2018   Cramping of feet 04/20/2018    Past Medical History:  Diagnosis Date   Barrett's esophagus    BPH (benign prostatic hyperplasia)    Colon polyps    Complication of anesthesia    during biopsy, felt hot and sweating   Coronary artery disease    stent 4 2023   FH: factor V Leiden mutation    pt tested negatiove    niece died of leiden factor five complications, sister has   Frequent headaches    GERD (gastroesophageal reflux disease)    History of hiatal hernia    History of kidney stones    Hyperlipidemia    Hypertension    Migraines     no recent migraines had visual aura   Neuromuscular disorder (HCC)    neuropathy feet   Pneumonia    Prostate cancer [facility])     Past Surgical History:  Procedure Laterality Date   colonscopy     CORONARY ANGIOGRAPHY N/A 09/08/2021   Procedure: CORONARY ANGIOGRAPHY;  Surgeon: Sherren Mocha, MD;  Location: [place] [facility];  Service: Cardiovascular;  Laterality: N/A;   CORONARY STENT INTERVENTION N/A 09/08/2021   Procedure: CORONARY STENT INTERVENTION;  Surgeon: Sherren Mocha, MD;  Location: [place] [facility];  Service: Cardiovascular;  Laterality: N/A;   CYSTOSCOPY N/A 01/13/2020   Procedure: CYSTOSCOPY  FLEXIBLE;  Surgeon: Lucas Mallow, MD;  Location: [facility];  Service: Urology;  Laterality: N/A;  NO SEEDS FOUND IN BLADDER   ESOPHAGEAL MANOMETRY N/A 04/28/2022   Procedure: ESOPHAGEAL MANOMETRY (EM);  Surgeon: Wilford Corner, MD;  Location: [facility];  Service: Gastroenterology;  Laterality: N/A;   EYE SURGERY     Lasik   INTRAVASCULAR PRESSURE WIRE/FFR  STUDY N/A 09/08/2021   Procedure: INTRAVASCULAR PRESSURE WIRE/FFR STUDY;  Surgeon: Sherren Mocha, MD;  Location: [place] [facility];  Service: Cardiovascular;  Laterality: N/A;   MASS EXCISION Left 02/08/2021   Procedure: EXCISION MASS OF MUCOID CYST WITH DISTAL INTERPHANGEAL JOINT ARTHROTOMY LEFT MIDDLE FINGER;  Surgeon: [name], MD;  Location: [place];  Service: Orthopedics;  Laterality: Left;   NASAL ENDOSCOPY     PROSTATE BIOPSY  may 2021 and 2020   RADIOACTIVE SEED IMPLANT N/A 01/13/2020   Procedure: RADIOACTIVE SEED IMPLANT/BRACHYTHERAPY IMPLANT;  Surgeon: Lucas Mallow, MD;  Location: [place];  Service: Urology;  Laterality: N/A;    73 SEEDS IMPLANTED   SPACE OAR INSTILLATION N/A 01/13/2020   Procedure: SPACE OAR INSTILLATION;  Surgeon: Lucas Mallow, MD;  Location: [facility];  Service: Urology;  Laterality: N/A;    Social History   Socioeconomic History   Marital status: Married    Spouse name: Not on file   Number of children: 3   Years of education: Not on file   Highest education level: Not on file  Occupational History    Comment: full time  Tobacco Use   Smoking status: Former    Packs/day: 1.00    Years: 12.00    Total pack years: 12.00    Types: Cigarettes    Quit date: 05/30/1978    Years since quitting: 44.0   Smokeless tobacco: Never  Vaping Use   Vaping Use: Never used  Substance and Sexual Activity   Alcohol use: Yes    Comment: rarely   Drug use: Not Currently   Sexual activity: Not Currently  Other Topics Concern   Not on file  Social History Narrative   Not on file   Social Determinants of Health   Financial Resource Strain: Not on file  Food Insecurity: Not on file  Transportation Needs: Not on file  Physical Activity: Not on file  Stress: Not on file  Social Connections: Not on file  Intimate Partner Violence: Not on file    Family History  Problem Relation Age of Onset    Arthritis Mother    Heart disease Father    Hyperlipidemia Father    Hypertension Father    Breast cancer Sister    Diabetes Maternal Grandmother    Hearing loss Paternal Grandmother    Early death Paternal Grandmother    Hearing loss Paternal Grandfather    Early death Paternal Grandfather    Allergic rhinitis Neg Hx    Asthma Neg Hx    Eczema Neg Hx    Urticaria Neg Hx    Prostate cancer Neg Hx    Colon cancer Neg Hx    Pancreatic cancer Neg Hx     Facility-Administered Medications Prior to Admission  Medication Dose Route Frequency Provider Last Rate Last Admin   dexamethasone (DECADRON) injection 4 mg  4 mg Intra-articular Once Lorenda Peck, DPM       Medications Prior to Admission  Medication Sig Dispense Refill Last Dose   amLODipine (NORVASC) 5 MG  tablet Take 1 tablet by mouth daily. 90 tablet 0 05/19/2022 at 0410   aspirin EC 81 MG tablet Take 81 mg by mouth daily. Swallow whole.   05/19/2022 at 0410   cholecalciferol (VITAMIN D3) 25 MCG (1000 UT) tablet Take 1,000 Units by mouth daily.   Past Week   diltiazem (DILACOR XR) 240 MG 24 hr capsule Take 1 capsule by mouth daily. 90 capsule 0 05/19/2022 at 0410   ibuprofen (ADVIL) 200 MG tablet Take 200 mg by mouth every 6 (six) hours as needed for moderate pain.   Past Week   ipratropium (ATROVENT) 0.06 % nasal spray Place 2 sprays into both nostrils 3 (three) times daily as needed for rhinitis. (Patient taking differently: Place 2 sprays into both nostrils daily.) 45 mL 3 05/19/2022 at 0410   lisinopril (ZESTRIL) 10 MG tablet Take 1 tablet (10 mg total) by mouth daily. 90 tablet 3 05/18/2022   nitroGLYCERIN (NITROSTAT) 0.4 MG SL tablet Place 1 tablet (0.4 mg total) under the tongue every 5 (five) minutes as needed for chest pain. Do not use with tadalafil and sildenafil 25 tablet 2    Omega-3 Fatty Acids (FISH OIL) 1200 MG CAPS Take 1,200 mg by mouth 2 (two) times daily.   Past Week   pantoprazole (PROTONIX) 40 MG tablet  Take 1 tablet (40 mg total) by mouth 2 (two) times daily.   05/19/2022 at 0410   rosuvastatin (CRESTOR) 40 MG tablet Take 1 tablet (40 mg total) by mouth daily. 90 tablet 3 05/18/2022   Vibegron (GEMTESA) 75 MG TABS Take 75 mg by mouth daily.   05/19/2022 at 0410    Current Facility-Administered Medications  Medication Dose Route Frequency Provider Last Rate Last Admin   0.9 %  sodium chloride infusion   Intravenous Continuous Barnet Glasgow, MD 10 mL/hr at 05/19/22 0614 New Bag at 05/19/22 0614   bupivacaine liposome (EXPAREL) 1.3 % injection 266 mg  20 mL Infiltration Once Michael Boston, MD       cefTRIAXone (ROCEPHIN) 2 g in sodium chloride 0.9 % 100 mL IVPB  2 g Intravenous On Call to OR Michael Boston, MD       And   metroNIDAZOLE (FLAGYL) IVPB 500 mg  500 mg Intravenous On Call to OR Michael Boston, MD       Chlorhexidine Gluconate Cloth 2 % PADS 6 each  6 each Topical Once Michael Boston, MD       dexamethasone (DECADRON) injection 4 mg  4 mg Intravenous On Call to OR Michael Boston, MD       lactated ringers infusion   Intravenous Continuous Nolon Nations, MD       scopolamine (TRANSDERM-SCOP) 1 MG/3DAYS 1.5 mg  1 patch Transdermal On Call to OR Michael Boston, MD   1.5 mg at 05/19/22 0612     No Known Allergies  BP (!) 159/73   Pulse 80   Temp 98.2 F (36.8 C) (Oral)   Resp 16   Ht '6\' 2"'$  (1.88 m)   Wt 108.4 kg   SpO2 97%   BMI 30.69 kg/m   Labs: No results found for this or any previous visit (from the past 58 hour(s)).  Imaging / Studies: No results found.   Adin Hector, M.D., F.A.C.S. Gastrointestinal and Minimally Invasive Surgery [facility] [address] [place], [place] [address] [phone] Main / Paging  05/19/2022 7:20 AM    Adin Hector

## 2022-05-19 NOTE — Op Note (Signed)
05/19/2022  10:52 AM  PATIENT:  Dean Guzman  71 y.o. male  Patient Care Team: Shelda Pal, DO as PCP - General (Family Medicine) Michael Boston, MD as Consulting Physician (General Surgery) Wilford Corner, MD as Consulting Physician (Gastroenterology) Park Liter, MD as Consulting Physician (Cardiology)  PRE-OPERATIVE DIAGNOSIS:  paraesophageal hiatal hernia  POST-OPERATIVE DIAGNOSIS:  Incarcerated paraesophageal hiatal hernia Barrett's esophagus  PROCEDURE:   1. ROBOTIC reduction of paraesophageal hiatal hernia 2. Type II mediastinal dissection. 3. Primary repair of hiatal hernia over pledgets.  4. Anterior & posterior gastropexy. 5. Toupet fundoplication (partial posterior 270 degree x 4 cm length) 6. Mesh reinforcement with absorbable mesh  SURGEON:  Adin Hector, MD  ASSIST:  Leighton Ruff, MD, FACS, FASCRS An experienced assistant was required given the standard of surgical care given the complexity of the case.  This assistant was needed for exposure, dissection, suction, tissue approximation, retraction, perception, etc.     ANESTHESIA:   local and general  Regional TRANSVERSUS ABDOMINIS PLANE (TAP) nerve block for perioperative & postoperative pain control provided with liposomal bupivacaine (Experel) mixed with 0.25% bupivacaine as a Bilateral TAP block x 32m each side at the level of the transverse abdominis & preperitoneal spaces along the flank at the anterior axillary line, from subxiphoid costal ridge to iliac crest under laparoscopic guidance    EBL:  Total I/O In: 1100 [I.V.:900; IV Piggyback:200] Out: 20 [Blood:20]  Delay start of Pharmacological VTE agent (>24hrs) due to surgical blood loss or risk of bleeding:  no  ANESTHESIA: 1. General anesthesia. 2. Local anesthetic in a field block around all port sites.  SPECIMEN:  Mediastinal hernia sac (not sent).  DRAINS:  A 19-French Blake drain goes from the right upper  quadrant along the lesser curvature of the stomach into the mediastinum.  COUNTS:  YES  PLAN OF CARE: Admit to inpatient   PATIENT DISPOSITION:  PACU - hemodynamically stable.  INDICATION:   Patient with symptomatic paraesophageal hiatal hernia.  The patient has had extensive work-up & we feel the patient will benefit from repair:  The anatomy & physiology of the foregut and anti-reflux mechanism was discussed.  The pathophysiology of hiatal herniation and GERD was discussed.  Natural history risks without surgery was discussed.   The patient's symptoms are not adequately controlled by medicines and other non-operative treatments.  I feel the risks of no intervention will lead to serious problems that outweigh the operative risks; therefore, I recommended surgery to reduce the hiatal hernia out of the chest and fundoplication to rebuild the anti-reflux valve and control reflux better.  Need for a thorough workup to rule out the differential diagnosis and plan treatment was explained.  I explained laparoscopic techniques with possible need for an open approach.  Risks such as bleeding, infection, abscess, leak, need for further treatment, heart attack, death, and other risks were discussed.   I noted a good likelihood this will help address the problem.  Goals of post-operative recovery were discussed as well.  Possibility that this will not correct all symptoms was explained.  Post-operative dysphagia, need for short-term liquid & pureed diet, inability to vomit, possibility of reherniation, possible need for medicines to help control symptoms in addition to surgery were discussed.  We will work to minimize complications.   Educational handouts further explaining the pathology, treatment options, and dysphagia diet was given as well.  Questions were answered.  The patient expresses understanding & wishes to proceed with surgery.  OR FINDINGS:  Moderate-sized paraesophageal hiatal hernia with 33%  of the stomach in the mediastinum.  There was a 9 x 6 cm hiatal defect.  It is a primary repair over pledgets. Mesh reinforcement was used with GORE BIO-A mesh, a biosynthetic web scaffold made of 02% polyglycolic acid (PGA): 58% trimethylene carbonate (TMC).   The patient has a 4cm Toupet (partial posterior 527 degree) fundoplication x 4 cm length.  The patient has had anterior and posterior gastropexy.  DESCRIPTION:   Informed consent was confirmed.  The patient received IV antibiotics prior to incision.  The underwent general anesthesia without difficulty.  A Foley catheter sterilely placed.  The patient was positioned in split leg with arms tucked. The abdomen was prepped and draped in the sterile fashion.  Surgical time-out confirmed our plan.  I placed a 5 mm port in the left subcostal region using Varess entry technique with the patient in steep reverse Trendelenburg and left side up.  Entry was clean.  We induced carbon dioxide insufflation.  Camera inspection revealed no injury.  Under direct visualization, I placed extra ports.  I also placed a 5 mm port in the left subxiphoid region under direct visualization.  I removed that and placed an Omega-shaped rigid Nathanson liver retractor to lift the left lateral sector of the liver anteriorly to expose the esophageal hiatus.  This was secured to the bed using the iron man system.  The Xi robot was carefully docked and instruments placed and advanced under direct visualization.  We focused on dissection.  We grasped the anterior mediastinal sac at the apex of the crus.  I scored through that and got into the anterior mediastinum.  I was able to free the mediastinal sac from its attachments to the pericardium and bilateral pleura using primarily focused gentle blunt dissection as well as focused vessel sealer dissection.  I transected phrenoesophageal attachments to the inner right crus, preserving a two centimeter cuff of mediastinal sac until I  found the base of the crura.  I then came around anteriorly on the left side and freed up the phrenoesophageal attachments of the mediastinal sac on the medial part of the left crus on the superior half.  I did careful mediastinal dissection to free the mediastinal sac.  With that, we could relieve the suction cup affect of the hernia sac and help reduce the stomach back down into the abdomen, flipped back approriately.  We ligated the short gastrics along the lesser curvature of the stomach about a third the way down and then came up proximally over the fundus.  We released the attachments of the stomach to the retroperitoneum until we were able to connect with the prior dissection on the left crus.  We completed the release of phrenoesophageal attachments to the medial part of the left crus down to its base.  With this, we had circumferential mobilization.    We placed the stomach and esophagus on axial tension.  I then did a Type II mediastinal dissection where I freed the esophagus from its attachments to the aorta, spine, pleura, and pericardium using primarily gentle blunt as well as focused ultrasonic dissection.  We saw the anterior & posterior vagus nerves intact.  We preserved it at all times.  I proceeded to dissect about 20 cm proximally into the mediastinum.  With that I could straighten out the esophagus and get 5 cm of intra-abdominal length of the esophagus off tension.  I freed the anterior mediastinal sac off the esophagus &  stomach.  We saw the anterior vagus nerve and freed the sac off of the vagus.  I dissected out & removed the fatty epiphrenic pads at the esophagogastric junction. With that, I could better define the esophagogastric junction.  I confirmed the the patient had 5 cm of intra-abdominal esophageal length off tension. I brought the fundus of the stomach posterior to the esophagus over to the right side.  The wrap was mobile with the classic shoe shine maneuver.  Wrap became  together gently.  We reflected the stomach left laterally and closed the esophageal hiatus using 0 Ethibond stitch using horizontal mattress stitches with pledgets on both sides.  I did that x3 stitches.  The crura had good substance and they came together well without any tension.  Because of the moderate-sized hiatal hernia defect in the setting of obesity, I reinforced the repair using a 10 x 7 cm Bio-A mesh.  We brought the mesh in and laid it over the crural repair, tails anterior over the crura.  I tucked the broader "U" tail of the mesh between the left diaphragm and the spleen, the narrower "U" tail over the right crus.  I secured to the left lateral and left superior sides of the broader "U" tail to the left diaphragm band with 2-0 V lock running suture.  Secured the more narrow you towel over the right crus using a running 2-0 V lock suture as well  I brought the fundus of the stomach behind the esophagus and cardia to set up a fundoplication wrap.  I did a posterior gastropexy by taking of #1 Ethibond stitches to the posterior part of the right side of the wrap and thru the Phasix mesh and crural closure.  I placed a similar stitch on the left anterior side as well.  That way the stomach covered the mesh and protected it from any esophageal exposure.  With the anterior and posterior gastropexy's, stomach laid well for a fundoplication wrap.  I asked anesthesia to passed a bougie transorally since his manometry was good for Nissen.  However they are having issues with that safely passing the hypopharynx.  The patient had good intra-abdominal esophageal length off tension, we decided to transition to a classic 4cm Toupet fundoplication on the true esophagus above the cardia using 0 Ethibond stitch in the left superior side of the wrap, apex of the left inner crus then left anterior esophagus and tied that down to do a left anterior gastropexy.  Did a mirror-image stitch on the right side to do a right  anterior gastropexy.  I then did 2 more distal pairs of suture between the inner part of the wrap and the anterolateral esophagus.  That way there were 3 total pairs of fundoplication sutures offering a posterior 270 degree wrap.  Measured at 4 cm.  Classic Toupet fundoplication.    The wrap was soft and floppy.  Trimmed off hernia sac and epiphrenic pads placed inside and EcoSac and safely removed under laparoscopic visualization.  We placed a drain as noted above.  I did irrigation and ensured hemostasis.  I saw no evidence of any leak or perforation or other abnormality.  I removed the [employer] liver retractor under direct visualization.  I evacuated carbon dioxide and removed the ports.  The skin was closed with Monocryl and sterile dressings applied.  The patient is being extubated and brought back to the recovery room.  I discussed postop care in detail with the patient and family in  in the office.  Discussed again with the patient in the holding area.  I discussed operative findings, updated the patient's status, discussed probable steps to recovery, and gave postoperative recommendations to the patient's spouse, Felis Quillin .  Recommendations were made.  Questions were answered.  She expressed understanding & appreciation.   Adin Hector, M.D., F.A.C.S. Gastrointestinal and Minimally Invasive Surgery [facility] [address], [place], [place] [address] [phone] Main / Paging

## 2022-05-19 NOTE — Transfer of Care (Signed)
Immediate Anesthesia Transfer of Care Note  Patient: Sumedh Shinsato  Procedure(s) Performed: XI ROBOTIC ASSISTED REPAIR OF PARAESOPHAGEAL HIATAL HERNIA WITH FUNDOPLICATION  Patient Location: PACU  Anesthesia Type:General  Level of Consciousness: awake, alert , oriented, and patient cooperative  Airway & Oxygen Therapy: Patient Spontanous Breathing and Patient connected to face mask oxygen  Post-op Assessment: Report given to RN, Post -op Vital signs reviewed and stable, and Patient moving all extremities X 4  Post vital signs: Reviewed and stable  Last Vitals:  Vitals Value Taken Time  BP 161/76 05/19/22 1034  Temp 37.1 C 05/19/22 1034  Pulse 68 05/19/22 1036  Resp 18 05/19/22 1036  SpO2 99 % 05/19/22 1036  Vitals shown include unvalidated device data.  Last Pain:  Vitals:   05/19/22 0610  TempSrc:   PainSc: 0-No pain      Patients Stated Pain Goal: 4 (71/06/26 9485)  Complications: No notable events documented.

## 2022-05-19 NOTE — Anesthesia Postprocedure Evaluation (Signed)
Anesthesia Post Note  Patient: Dean Guzman  Procedure(s) Performed: XI ROBOTIC ASSISTED REPAIR OF PARAESOPHAGEAL HIATAL HERNIA WITH FUNDOPLICATION     Patient location during evaluation: PACU Anesthesia Type: General Level of consciousness: awake and alert Pain management: pain level controlled Vital Signs Assessment: post-procedure vital signs reviewed and stable Respiratory status: spontaneous breathing, nonlabored ventilation, respiratory function stable and patient connected to nasal cannula oxygen Cardiovascular status: blood pressure returned to baseline and stable Postop Assessment: no apparent nausea or vomiting Anesthetic complications: no  No notable events documented.  Last Vitals:  Vitals:   05/19/22 1145 05/19/22 1200  BP: (!) 141/69 (!) 142/69  Pulse: (!) 58 (!) 59  Resp: 16 17  Temp:    SpO2: 92% 92%    Last Pain:  Vitals:   05/19/22 1203  TempSrc:   PainSc: 5                  Barnet Glasgow

## 2022-05-20 ENCOUNTER — Inpatient Hospital Stay (HOSPITAL_COMMUNITY): Payer: PPO

## 2022-05-20 MED ORDER — IOHEXOL 300 MG/ML  SOLN
150.0000 mL | Freq: Once | INTRAMUSCULAR | Status: AC | PRN
  Administered 2022-05-20: 50 mL via ORAL

## 2022-05-20 MED ORDER — FUROSEMIDE 40 MG PO TABS
40.0000 mg | ORAL_TABLET | Freq: Once | ORAL | Status: AC
  Administered 2022-05-20: 40 mg via ORAL
  Filled 2022-05-20: qty 1

## 2022-05-20 MED ORDER — ROSUVASTATIN CALCIUM 20 MG PO TABS
40.0000 mg | ORAL_TABLET | Freq: Every day | ORAL | Status: DC
  Administered 2022-05-20: 40 mg via ORAL
  Filled 2022-05-20 (×2): qty 2

## 2022-05-20 NOTE — Progress Notes (Signed)
Pt said he use to wear cpap long ago not any more and doesn't want to do it.

## 2022-05-20 NOTE — TOC CM/SW Note (Signed)
Transition of Care [facility]) Screening Note  Patient Details  Name: Dean Guzman Date of Birth: 1950-06-11  Transition of Care [facility]) CM/SW Contact:    Sherie Don, LCSW Phone Number: 05/20/2022, 11:33 AM  Transition of Care Department [facility]) has reviewed patient and no TOC needs have been identified at this time. We will continue to monitor patient advancement through interdisciplinary progression rounds. If new patient transition needs arise, please place a TOC consult.

## 2022-05-20 NOTE — Progress Notes (Signed)
Dean Guzman [id number] 22-May-1951  CARE TEAM:  PCP: Shelda Pal, DO  Outpatient Care Team: Patient Care Team: Shelda Pal, DO as PCP - General (Family Medicine) Michael Boston, MD as Consulting Physician (General Surgery) Wilford Corner, MD as Consulting Physician (Gastroenterology) Park Liter, MD as Consulting Physician (Cardiology)  Inpatient Treatment Team: Treatment Team: Attending Provider: Michael Boston, MD; Technician: Lissa Merlin, NT; Utilization Review: Tressie Stalker, RN; Charge Nurse: Steward Ros, RN; Pharmacist: Tawnya Crook, [facility]   Problem List:   Principal Problem:   Incarcerated hiatal hernia   1 Day Post-Op  05/19/2022  POST-OPERATIVE DIAGNOSIS:  Incarcerated paraesophageal hiatal hernia Barrett's esophagus   PROCEDURE:   1. ROBOTIC reduction of paraesophageal hiatal hernia 2. Type II mediastinal dissection. 3. Primary repair of hiatal hernia over pledgets.  4. Anterior & posterior gastropexy. 5. Toupet fundoplication (partial posterior 270 degree x 4 cm length) 6. Mesh reinforcement with absorbable mesh   SURGEON:  Adin Hector, MD  OR FINDINGS:  Moderate-sized paraesophageal hiatal hernia with 33% of the stomach in the mediastinum.  There was a 9 x 6 cm hiatal defect.   It is a primary repair over pledgets. Mesh reinforcement was used with GORE BIO-A mesh, a biosynthetic web scaffold made of 98% polyglycolic acid (PGA): 33% trimethylene carbonate (TMC).    The patient has a 4cm Toupet (partial posterior 825 degree) fundoplication x 4 cm length.  The patient has had anterior and posterior gastropexy.  Assessment  Recovering gradually  [facility] Stay = 1 days)  Plan:  -Low volume clear liquids for now Esophagram this morning.  If no strong evidence of leak/obstruction, advance to dysphagia 1/pured diet. -IV Decadron.  Periop -Diuresis x 1.  Lasix. -Hypertension control.  Continue  amlodipine.  Lower dose diltiazem with backup and follow. -Obstructive sleep apnea.  Does not use CPAP.  Oxygen as needed nightly -VTE prophylaxis- SCDs, etc -mobilize as tolerated to help recovery  Disposition:  Disposition:  The patient is from: Home  Anticipate discharge to:  Home  Anticipated Date of Discharge is:  December 23,2023    Barriers to discharge:  Pending Clinical improvement (more likely than not)  Patient currently is NOT MEDICALLY STABLE for discharge from the hospital from a surgery standpoint.      I reviewed last 24 h vitals and pain scores, last 48 h intake and output, last 24 h labs and trends, and last 24 h imaging results. I have reviewed this patient's available data, including medical history, events of note, test results, etc as part of my evaluation.  A significant portion of that time was spent in counseling.  Care during the described time interval was provided by me.  This care required moderate level of medical decision making.  05/20/2022    Subjective: (Chief complaint)  Some dysphagia to pills.  Better when crushed.  Tolerating liquids.  Again hallways.  Pain/nausea mostly controlled.  Objective:  Vital signs:  Vitals:   05/19/22 1619 05/19/22 2007 05/20/22 0138 05/20/22 0459  BP: (!) 153/74 (!) 154/76 (!) 154/78 (!) 140/73  Pulse: 68 67 72 75  Resp: '16 18 20 20  '$ Temp: 98.3 F (36.8 C) 99 F (37.2 C) 98.7 F (37.1 C) 98.4 F (36.9 C)  TempSrc: Oral Oral Oral Oral  SpO2: 98% 100% 92% 93%  Weight:      Height:           Intake/Output   Yesterday:  12/21 0701 -  12/22 0700 In: 2227.5 [P.O.:540; I.V.:1487.5; IV Piggyback:200] Out: 1470 [Urine:1300; Drains:150; Blood:20] This shift:  No intake/output data recorded.  Bowel function:  Flatus: No  BM:  No  Drain: Serosanguinous   Physical Exam:  General: Pt awake/alert in no acute distress Eyes: PERRL, normal EOM.  Sclera clear.  No icterus Neuro: CN II-XII  intact w/o focal sensory/motor deficits. Lymph: No head/neck/groin lymphadenopathy Psych:  No delerium/psychosis/paranoia.  Oriented x 4 HENT: Normocephalic, Mucus membranes moist.  No thrush Neck: Supple, No tracheal deviation.  No obvious thyromegaly Chest: No pain to chest wall compression.  Good respiratory excursion.  No audible wheezing CV:  Pulses intact.  Regular rhythm.  No major extremity edema MS: Normal AROM mjr joints.  No obvious deformity  Abdomen: Soft.  Mildy distended.  Mildly tender at incisions only.  No evidence of peritonitis.  No incarcerated hernias.  Ext:   No deformity.  No mjr edema.  No cyanosis Skin: No petechiae / purpurea.  No major sores.  Warm and dry    Results:   Cultures: No results found for this or any previous visit (from the past 720 hour(s)).  Labs: No results found for this or any previous visit (from the past 48 hour(s)).  Imaging / Studies: No results found.  Medications / Allergies: per chart  Antibiotics: Anti-infectives (From admission, onward)    Start     Dose/Rate Route Frequency Ordered Stop   05/19/22 0600  cefTRIAXone (ROCEPHIN) 2 g in sodium chloride 0.9 % 100 mL IVPB       See Hyperspace for full Linked Orders Report.   2 g 200 mL/hr over 30 Minutes Intravenous On call to O.R. 05/19/22 0525 05/19/22 0740   05/19/22 0600  metroNIDAZOLE (FLAGYL) IVPB 500 mg       See Hyperspace for full Linked Orders Report.   500 mg 100 mL/hr over 60 Minutes Intravenous On call to O.R. 05/19/22 0525 05/19/22 0800         Note: Portions of this report may have been transcribed using voice recognition software. Every effort was made to ensure accuracy; however, inadvertent computerized transcription errors may be present.   Any transcriptional errors that result from this process are unintentional.    Adin Hector, MD, FACS, MASCRS Esophageal, Gastrointestinal & Colorectal Surgery Robotic and Minimally Invasive  Surgery  [facility]. [address] [place], [place] [address] [phone] Fax [phone] Main  CONTACT INFORMATION:  Weekday (9AM-5PM): Call [facility] main office at [phone]  Weeknight (5PM-9AM) or Weekend/Holiday: Check [email or web address] (password " [email or web address]") for General Surgery [facility] coverage  (Please, do not use SecureChat as it is not reliable communication to reach operating surgeons for immediate patient care given surgeries/outpatient duties/clinic/cross-coverage/off post-call which would lead to a delay in care.  Epic staff messaging available for outptient concerns, but may not be answered for 48 hours or more).     05/20/2022  7:45 AM

## 2022-05-21 NOTE — Discharge Summary (Signed)
Physician Discharge Summary    Patient ID: Dean Guzman MRN: [id number] DOB/AGE: [birth year]  71 y.o.  Patient Care Team: Shelda Pal, DO as PCP - General (Family Medicine) Michael Boston, MD as Consulting Physician (General Surgery) Wilford Corner, MD as Consulting Physician (Gastroenterology) Park Liter, MD as Consulting Physician (Cardiology)  Admit date: 05/19/2022  Discharge date: 05/21/2022  Hospital Stay = 2 days    Discharge Diagnoses:  Principal Problem:   Incarcerated hiatal hernia Active Problems:   Essential hypertension   Hyperlipidemia   Perennial allergic rhinitis   GERD (gastroesophageal reflux disease)   FH: factor V Leiden mutation   Barrett's esophagus without dysplasia   Irritable bowel syndrome   OSA (obstructive sleep apnea)   2 Days Post-Op  05/19/2022  POST-OPERATIVE DIAGNOSIS:   paraesophageal hiatal hernia  SURGERY:  05/19/2022  Procedure(s): XI ROBOTIC ASSISTED REPAIR OF PARAESOPHAGEAL HIATAL HERNIA WITH FUNDOPLICATION  SURGEON:    Surgeon(s): Michael Boston, MD Leighton Ruff, MD  Consults: Case Management / Social Work, Nutrition, and [facility] Hospital Course:   The patient underwent the surgery above.  Postoperatively, the patient gradually mobilized and advanced to a pureed diet.  Pain and other symptoms were treated aggressively.    By the time of discharge, the patient was walking well the hallways, eating food, having flatus.  Pain was well-controlled on an oral medications.  Based on meeting discharge criteria and continuing to recover, I felt it was safe for the patient to be discharged from the hospital to further recover with close followup. Postoperative recommendations were discussed in detail.  They are written as well.  Discharged Condition: good  Discharge Exam: Blood pressure (!) 146/74, pulse 62, temperature 98.2 F (36.8 C), temperature source Oral, resp. rate 17, height  '6\' 2"'$  (1.88 m), weight 108.4 kg, SpO2 93 %.  General: Pt awake/alert/oriented x4 in No acute distress Eyes: PERRL, normal EOM.  Sclera clear.  No icterus Neuro: CN II-XII intact w/o focal sensory/motor deficits. Lymph: No head/neck/groin lymphadenopathy Psych:  No delerium/psychosis/paranoia HENT: Normocephalic, Mucus membranes moist.  No thrush Neck: Supple, No tracheal deviation Chest:  No chest wall pain w good excursion CV:  Pulses intact.  Regular rhythm MS: Normal AROM mjr joints.  No obvious deformity Abdomen: Soft.  Nondistended.  Mildly tender at incisions only.  No evidence of peritonitis.  No incarcerated hernias. Ext:  SCDs BLE.  No mjr edema.  No cyanosis Skin: No petechiae / purpura   Disposition:    Follow-up Information     Michael Boston, MD. Schedule an appointment as soon as possible for a visit in 3 week(s).   Specialties: General Surgery, Colon and Rectal Surgery Why: To follow up after your operation Contact information: [place] [address] [phone]                 Discharge disposition: 01-Home or Self Care       Discharge Instructions     Call MD for:   Complete by: As directed    Temperature > 101.47F   Call MD for:  extreme fatigue   Complete by: As directed    Call MD for:  hives   Complete by: As directed    Call MD for:  persistant nausea and vomiting   Complete by: As directed    Call MD for:  redness, tenderness, or signs of infection (pain, swelling, redness, odor or green/yellow discharge around incision site)   Complete by:  As directed    Call MD for:  severe uncontrolled pain   Complete by: As directed    Diet general   Complete by: As directed    SEE ESOPHAGEAL SURGERY DIET INSTRUCTIONS  We using usually start you out on a pureed (blenderized) diet. Expect some sticking with swallowing over the next 1-2 months.   This is due to swelling around your esophagus at the wrap & hiatal  diaphragm repair.  It will gradually ease off over the next few months.   Discharge instructions   Complete by: As directed    Please see discharge instruction sheets.   Also refer to any handouts/printouts that may have been given from the [facility] surgery office (if you visited [place] there before surgery) Please call our office if you have any questions or concerns [phone]   Driving Restrictions   Complete by: As directed    No driving until off narcotics and can safely swerve away without pain during an emergency   Increase activity slowly   Complete by: As directed    Lifting restrictions   Complete by: As directed    Avoid heavy lifting initially, <20 pounds at first.   Do not push through pain.   You have no specific weight limit: If it hurts to do, DON'T DO IT.    If you feel no pain, you are not injuring anything.  Pain will protect you from injury.   Coughing and sneezing are far more stressful to your incision than any lifting.   Avoid resuming heavy lifting (>50 pounds) or other intense activity until off all narcotic pain medications.   When want to exercise more, give yourself 2 weeks to gradually get back to full intense exercise/activity.   May shower / Bathe   Complete by: As directed    [place].  It is fine for dressings or wounds to be washed/rinsed.  Use gentle soap & water.  This will help the incisions and/or wounds get clean & minimize infection.   May walk up steps   Complete by: As directed    Remove dressing in 72 hours   Complete by: As directed    You have closed incisions: Shower and bathe over these incisions with soap and water every day.  It is OK to wash over the dressings: they are waterproof. Remove all surgical dressings on postoperative day #3.  You do not need to replace dressings over the closed incisions unless you feel more comfortable with a Band-Aid covering it.   TEGADERM:  You have clear gauze band-aid dressings over your closed  incision(s).  Remove the dressings 3 days after surgery.= 12/24   Please call our office [phone] if you have further questions.   Sexual Activity Restrictions   Complete by: As directed    Sexual activity as tolerated.  Do not push through pain.  Pain will protect you from injury.   Walk with assistance   Complete by: As directed    Walk over an hour a day.  May use a walker/cane/companion to help with balance and stamina.       Allergies as of 05/21/2022   No Known Allergies      Medication List     TAKE these medications    amLODipine 5 MG tablet Commonly known as: NORVASC Take 1 tablet by mouth daily.   aspirin EC 81 MG tablet Take 81 mg by mouth daily. Swallow whole.   cholecalciferol 25 MCG (1000 UNIT)  tablet Commonly known as: VITAMIN D3 Take 1,000 Units by mouth daily.   diltiazem 240 MG 24 hr capsule Commonly known as: DILACOR XR Take 1 capsule by mouth daily.   Fish Oil 1200 MG Caps Take 1,200 mg by mouth 2 (two) times daily.   Gemtesa 75 MG Tabs Generic drug: Vibegron Take 75 mg by mouth daily.   ibuprofen 200 MG tablet Commonly known as: ADVIL Take 200 mg by mouth every 6 (six) hours as needed for moderate pain.   ipratropium 0.06 % nasal spray Commonly known as: ATROVENT Place 2 sprays into both nostrils 3 (three) times daily as needed for rhinitis. What changed: when to take this   lisinopril 10 MG tablet Commonly known as: ZESTRIL Take 1 tablet (10 mg total) by mouth daily.   nitroGLYCERIN 0.4 MG SL tablet Commonly known as: Nitrostat Place 1 tablet (0.4 mg total) under the tongue every 5 (five) minutes as needed for chest pain. Do not use with tadalafil and sildenafil   ondansetron 4 MG tablet Commonly known as: ZOFRAN Take 1 tablet (4 mg total) by mouth every 8 (eight) hours as needed for nausea.   pantoprazole 40 MG tablet Commonly known as: PROTONIX Take 1 tablet (40 mg total) by mouth 2 (two) times daily.   rosuvastatin 40  MG tablet Commonly known as: CRESTOR Take 1 tablet (40 mg total) by mouth daily.   traMADol 50 MG tablet Commonly known as: ULTRAM Take 1-2 tablets (50-100 mg total) by mouth every 6 (six) hours as needed for moderate pain or severe pain.        Significant Diagnostic Studies:  No results found for this or any previous visit (from the past 72 hour(s)).  DG ESOPHAGUS W SINGLE CM (SOL OR THIN BA)  Result Date: 05/20/2022 CLINICAL DATA:  Status post laparoscopic fundoplication yesterday. EXAM: ESOPHOGRAM/BARIUM SWALLOW TECHNIQUE: Single contrast examination was performed using water-soluble contrast (150 cc Omnipaque 300). FLUOROSCOPY: Radiation Exposure Index (as provided by the fluoroscopic device): 42.4 mGy Kerma COMPARISON:  Abdominopelvic CT 01/17/2022 FINDINGS: The patient swallowed the contrast without difficulty. Evaluation was performed in the erect, prone and supine positions. The esophageal motility is normal. There are postsurgical changes at the gastroesophageal junction consistent with fundoplication. No residual hernia identified. There is no evidence of stricture, ulceration or leak. IMPRESSION: Expected postoperative appearance status post fundoplication. No demonstrated leak or other complication. Electronically Signed   By: Richardean Sale M.D.   On: 05/20/2022 09:14    Past Medical History:  Diagnosis Date   Barrett's esophagus    BPH (benign prostatic hyperplasia)    Colon polyps    Complication of anesthesia    during biopsy, felt hot and sweating   Coronary artery disease    stent 4 2023   FH: factor V Leiden mutation    pt tested negatiove    niece died of leiden factor five complications, sister has   Frequent headaches    GERD (gastroesophageal reflux disease)    History of hiatal hernia    History of kidney stones    Hyperlipidemia    Hypertension    Migraines     no recent migraines had visual aura   Neuromuscular disorder (HCC)    neuropathy feet    Pneumonia    Prostate cancer [facility])     Past Surgical History:  Procedure Laterality Date   colonscopy     CORONARY ANGIOGRAPHY N/A 09/08/2021   Procedure: CORONARY ANGIOGRAPHY;  Surgeon: Sherren Mocha, MD;  Location: [place] [facility];  Service: Cardiovascular;  Laterality: N/A;   CORONARY STENT INTERVENTION N/A 09/08/2021   Procedure: CORONARY STENT INTERVENTION;  Surgeon: Sherren Mocha, MD;  Location: [place] [facility];  Service: Cardiovascular;  Laterality: N/A;   CYSTOSCOPY N/A 01/13/2020   Procedure: CYSTOSCOPY FLEXIBLE;  Surgeon: Lucas Mallow, MD;  Location: [facility];  Service: Urology;  Laterality: N/A;  NO SEEDS FOUND IN BLADDER   ESOPHAGEAL MANOMETRY N/A 04/28/2022   Procedure: ESOPHAGEAL MANOMETRY (EM);  Surgeon: Wilford Corner, MD;  Location: [facility];  Service: Gastroenterology;  Laterality: N/A;   EYE SURGERY     Lasik   INTRAVASCULAR PRESSURE WIRE/FFR STUDY N/A 09/08/2021   Procedure: INTRAVASCULAR PRESSURE WIRE/FFR STUDY;  Surgeon: Sherren Mocha, MD;  Location: [place] [facility];  Service: Cardiovascular;  Laterality: N/A;   MASS EXCISION Left 02/08/2021   Procedure: EXCISION MASS OF MUCOID CYST WITH DISTAL INTERPHANGEAL JOINT ARTHROTOMY LEFT MIDDLE FINGER;  Surgeon: [name], MD;  Location: [place];  Service: Orthopedics;  Laterality: Left;   NASAL ENDOSCOPY     PROSTATE BIOPSY  may 2021 and 2020   RADIOACTIVE SEED IMPLANT N/A 01/13/2020   Procedure: RADIOACTIVE SEED IMPLANT/BRACHYTHERAPY IMPLANT;  Surgeon: Lucas Mallow, MD;  Location: [place];  Service: Urology;  Laterality: N/A;    73 SEEDS IMPLANTED   SPACE OAR INSTILLATION N/A 01/13/2020   Procedure: SPACE OAR INSTILLATION;  Surgeon: Lucas Mallow, MD;  Location: [facility];  Service: Urology;  Laterality: N/A;    Social History   Socioeconomic History   Marital status: Married    Spouse name: Not on file    Number of children: 3   Years of education: Not on file   Highest education level: Not on file  Occupational History    Comment: full time  Tobacco Use   Smoking status: Former    Packs/day: 1.00    Years: 12.00    Total pack years: 12.00    Types: Cigarettes    Quit date: 05/30/1978    Years since quitting: 44.0   Smokeless tobacco: Never  Vaping Use   Vaping Use: Never used  Substance and Sexual Activity   Alcohol use: Yes    Comment: rarely   Drug use: Not Currently   Sexual activity: Not Currently  Other Topics Concern   Not on file  Social History Narrative   Not on file   Social Determinants of Health   Financial Resource Strain: Not on file  Food Insecurity: No Food Insecurity (05/19/2022)   Hunger Vital Sign    Worried About Running Out of Food in the Last Year: Never true    Ran Out of Food in the Last Year: Never true  Transportation Needs: No Transportation Needs (05/19/2022)   PRAPARE - Hydrologist (Medical): No    Lack of Transportation (Non-Medical): No  Physical Activity: Not on file  Stress: Not on file  Social Connections: Not on file  Intimate Partner Violence: Not At Risk (05/19/2022)   Humiliation, Afraid, Rape, and Kick questionnaire    Fear of Current or Ex-Partner: No    Emotionally Abused: No    Physically Abused: No    Sexually Abused: No    Family History  Problem Relation Age of Onset   Arthritis Mother    Heart disease Father    Hyperlipidemia Father    Hypertension Father  Breast cancer Sister    Diabetes Maternal Grandmother    Hearing loss Paternal Grandmother    Early death Paternal Grandmother    Hearing loss Paternal Grandfather    Early death Paternal Grandfather    Allergic rhinitis Neg Hx    Asthma Neg Hx    Eczema Neg Hx    Urticaria Neg Hx    Prostate cancer Neg Hx    Colon cancer Neg Hx    Pancreatic cancer Neg Hx     Current Facility-Administered Medications  Medication Dose  Route Frequency Provider Last Rate Last Admin   0.9 %  sodium chloride infusion  250 mL Intravenous PRN Michael Boston, MD       acetaminophen (TYLENOL) tablet 1,000 mg  1,000 mg Oral Lajuana Ripple, MD   1,000 mg at 05/21/22 0510   amLODipine (NORVASC) tablet 5 mg  5 mg Oral Daily Michael Boston, MD   5 mg at 05/21/22 9509   aspirin EC tablet 81 mg  81 mg Oral Daily Michael Boston, MD   81 mg at 05/21/22 3267   bisacodyl (DULCOLAX) suppository 10 mg  10 mg Rectal Daily PRN Michael Boston, MD       Chlorhexidine Gluconate Cloth 2 % PADS 6 each  6 each Topical Once Michael Boston, MD       cholecalciferol (VITAMIN D3) 25 MCG (1000 UNIT) tablet 1,000 Units  1,000 Units Oral Daily Michael Boston, MD   1,000 Units at 05/21/22 0835   dexamethasone (DECADRON) injection 4 mg  4 mg Intravenous Gorden Harms, MD   4 mg at 05/21/22 0834   diltiazem (CARDIZEM) tablet 60 mg  60 mg Oral Tor Netters, MD   60 mg at 05/21/22 0837   diphenhydrAMINE (BENADRYL) 12.5 MG/5ML elixir 12.5 mg  12.5 mg Oral Q6H PRN Michael Boston, MD       Or   diphenhydrAMINE (BENADRYL) injection 12.5 mg  12.5 mg Intravenous Q6H PRN Michael Boston, MD       enalaprilat (VASOTEC) injection 0.625-1.25 mg  0.625-1.25 mg Intravenous Q6H PRN Michael Boston, MD       enoxaparin (LOVENOX) injection 40 mg  40 mg Subcutaneous Q24H Michael Boston, MD   40 mg at 05/21/22 0834   gabapentin (NEURONTIN) capsule 300 mg  300 mg Oral BID Michael Boston, MD   300 mg at 05/21/22 1245   HYDROmorphone (DILAUDID) injection 0.5-2 mg  0.5-2 mg Intravenous Q4H PRN Michael Boston, MD       ipratropium (ATROVENT) 0.06 % nasal spray 2 spray  2 spray Each Nare Daily Michael Boston, MD   2 spray at 05/20/22 0854   lactated ringers bolus 1,000 mL  1,000 mL Intravenous Q8H PRN Michael Boston, MD       lip balm (CARMEX) ointment   Topical BID Michael Boston, MD   Given at 05/21/22 8099   magic mouthwash  15 mL Oral QID PRN Michael Boston, MD   15 mL at 05/19/22 1718    magnesium hydroxide (MILK OF MAGNESIA) suspension 30 mL  30 mL Oral Daily PRN Michael Boston, MD       methocarbamol (ROBAXIN) 1,000 mg in dextrose 5 % 100 mL IVPB  1,000 mg Intravenous Q6H PRN Michael Boston, MD       methocarbamol (ROBAXIN) tablet 500 mg  500 mg Oral Q6H PRN Michael Boston, MD   500 mg at 05/20/22 0507   metoCLOPramide (REGLAN) injection 5-10 mg  5-10 mg Intravenous Q8H PRN  Michael Boston, MD       metoprolol tartrate (LOPRESSOR) injection 5 mg  5 mg Intravenous Q6H PRN Michael Boston, MD       nitroGLYCERIN (NITROSTAT) SL tablet 0.4 mg  0.4 mg Sublingual Q5 min PRN Michael Boston, MD       ondansetron (ZOFRAN-ODT) disintegrating tablet 4 mg  4 mg Oral Q6H PRN Michael Boston, MD       Or   ondansetron [facility]) injection 4 mg  4 mg Intravenous Q6H PRN Michael Boston, MD   4 mg at 05/19/22 1717   oxyCODONE (Oxy IR/ROXICODONE) immediate release tablet 5-10 mg  5-10 mg Oral Q4H PRN Michael Boston, MD       prochlorperazine (COMPAZINE) tablet 10 mg  10 mg Oral Q6H PRN Michael Boston, MD       Or   prochlorperazine (COMPAZINE) injection 5-10 mg  5-10 mg Intravenous Q6H PRN Michael Boston, MD       rosuvastatin (CRESTOR) tablet 40 mg  40 mg Oral Daily Donnie Mesa, MD   40 mg at 05/21/22 4742   simethicone (MYLICON) chewable tablet 40 mg  40 mg Oral Q6H PRN Michael Boston, MD   40 mg at 05/19/22 1718   sodium chloride flush (NS) 0.9 % injection 3 mL  3 mL Intravenous Gorden Harms, MD   3 mL at 05/21/22 0837   sodium chloride flush (NS) 0.9 % injection 3 mL  3 mL Intravenous PRN Michael Boston, MD       traMADol Veatrice Bourbon) tablet 50-100 mg  50-100 mg Oral Q6H PRN Michael Boston, MD   100 mg at 05/21/22 5956     No Known Allergies  Signed:   Adin Hector, MD, FACS, MASCRS Esophageal, Gastrointestinal & Colorectal Surgery Robotic and Minimally Invasive Surgery  [facility] [place] [address] [place], [place] [address] [phone] Fax [phone] Main  CONTACT INFORMATION:  Weekday (9AM-5PM): Call [facility] main office at [phone]  Weeknight (5PM-9AM) or Weekend/Holiday: Check [email or web address] (password " [email or web address]") for General Surgery [facility] coverage  (Please, do not use SecureChat as it is not reliable communication to reach operating surgeons for immediate patient care given surgeries/outpatient duties/clinic/cross-coverage/off post-call which would lead to a delay in care.  Epic staff messaging available for outptient concerns, but may not be answered for 48 hours or more).     05/21/2022, 8:44 AM

## 2022-05-21 NOTE — Progress Notes (Signed)
Reviewed written d/c instructions w pt and his daughter and all questions answered. They both verbalized understanding. D/C via w/c w all belongings in stable condition.

## 2022-05-22 ENCOUNTER — Telehealth: Payer: Self-pay | Admitting: Physician Assistant

## 2022-05-22 NOTE — Telephone Encounter (Signed)
Patient just discharged yesterday after hernia repair.  Due to swelling at the surgical site, he is unable to swallow large pills such as diltiazem capsule.  I asked him to restart diltiazem as early as possible once the swelling goes down.

## 2022-05-24 ENCOUNTER — Encounter (HOSPITAL_COMMUNITY): Payer: Self-pay | Admitting: Surgery

## 2022-05-24 ENCOUNTER — Telehealth: Payer: Self-pay | Admitting: *Deleted

## 2022-05-24 ENCOUNTER — Telehealth: Payer: Self-pay | Admitting: Cardiology

## 2022-05-24 MED ORDER — DILTIAZEM HCL ER COATED BEADS 120 MG PO CP24: 240.0000 mg | ORAL_CAPSULE | Freq: Every day | ORAL | 0 refills | Status: DC

## 2022-05-24 NOTE — Telephone Encounter (Signed)
Unfortunately diltiazem does not come in a liquid form. Consider two of the '120mg'$  ER tablets?

## 2022-05-24 NOTE — Addendum Note (Signed)
Addended by: Truddie Hidden on: 05/24/2022 10:08 AM   Modules accepted: Orders

## 2022-05-24 NOTE — Telephone Encounter (Signed)
Pt states he is unable to swallow the Diltiazem after his recent surgery and has not had since 05/19/22. Pt would like an alternative. Please advise.

## 2022-05-24 NOTE — Telephone Encounter (Signed)
Pt states that he is having issues taking larger pills currently and requesting call back to discuss options.

## 2022-05-24 NOTE — Patient Outreach (Signed)
  Care Coordination [facility] Note Transition Care Management Follow-up Telephone Call Date of discharge and from where: Saturday, 05/21/22 [facility]; incarcerated hernia with surgical repair How have you been since you were released from the hospital? "Doing fine overall.  Doing everything they told me to; my appetite remains very poor, but I am doing what I can to get something to eat on a regular basis and stay hydrated.  I have called the [facility] team and let them know that the puncture holes from the surgery have been draining somewhat, it seems to be getting less and less with each day; they told me to expect some drainage and to call them if it gets heavy.  My family is here and helping if I need it, but so far, I don't really need a lot of help." Any questions or concerns? No  Items Reviewed: Did the pt receive and understand the discharge instructions provided? Yes  Medications obtained and verified? Yes  Other? No  Any new allergies since your discharge? No  Dietary orders reviewed? Yes Do you have support at home? Yes  spouse and daughter assisting with care needs as indicated; patient reports he is independent in self-care  Home Care and Equipment/Supplies: Were home health services ordered? no If so, what is the name of the agency? N/A  Has the agency set up a time to come to the patient's home? not applicable Were any new equipment or medical supplies ordered?  No What is the name of the medical supply agency? N/A Were you able to get the supplies/equipment? not applicable Do you have any questions related to the use of the equipment or supplies? No N/A  Functional Questionnaire: (I = Independent and D = Dependent) ADLs: I  spouse and daughter assisting with care needs as indicated  Bathing/Dressing- I  Meal Prep- I  spouse and daughter assisting with care needs as indicated  Eating- I  Maintaining continence- I  Transferring/Ambulation- I  Managing Meds- I  spouse and  daughter assisting with care needs as indicated  Follow up appointments reviewed:  PCP Hospital f/u appt confirmed? No  Scheduled to see - on - @ [facility] f/u appt confirmed? Yes  Scheduled to see surgical provider, Dr. Johney Maine on Monday 06/06/22 @ 9:15 am- verified this is the recommended time frame for post-op office visit per review of EHR/ provider notes Are transportation arrangements needed? No  If their condition worsens, is the pt aware to call PCP or go to the Emergency Dept.? Yes Was the patient provided with contact information for the PCP's office or ED? No- patient declines; reports already has contact information for all care providers Was to pt encouraged to call back with questions or concerns? Yes  SDOH assessments and interventions completed:   Yes SDOH Interventions Today    Flowsheet Row Most Recent Value  SDOH Interventions   Food Insecurity Interventions Intervention Not Indicated  Transportation Interventions Intervention Not Indicated  [drives self,  family assists as/ if indicated]      Care Coordination Interventions:  Reviewed recommended post-op diet with patient and need to stay hydrated; reinforced/ provided education around post-op activity and restrictions, along with provided advice around importance of not over-doing post-op    Encounter Outcome:  Pt. Visit Completed    Oneta Rack, RN, BSN, CCRN Alumnus RN CM Care Coordination/ Transition of [place] Management [phone]: direct office

## 2022-06-02 ENCOUNTER — Encounter: Payer: Self-pay | Admitting: Family Medicine

## 2022-06-05 ENCOUNTER — Encounter (HOSPITAL_BASED_OUTPATIENT_CLINIC_OR_DEPARTMENT_OTHER): Payer: Self-pay

## 2022-06-05 ENCOUNTER — Emergency Department (HOSPITAL_BASED_OUTPATIENT_CLINIC_OR_DEPARTMENT_OTHER): Payer: PPO

## 2022-06-05 ENCOUNTER — Other Ambulatory Visit: Payer: Self-pay

## 2022-06-05 ENCOUNTER — Emergency Department (HOSPITAL_BASED_OUTPATIENT_CLINIC_OR_DEPARTMENT_OTHER)
Admission: EM | Admit: 2022-06-05 | Discharge: 2022-06-06 | Disposition: A | Discharge status: Home / Self Care | Payer: PPO | Attending: Emergency Medicine | Admitting: Emergency Medicine

## 2022-06-05 DIAGNOSIS — Z7982 Long term (current) use of aspirin: Secondary | ICD-10-CM | POA: Diagnosis not present

## 2022-06-05 DIAGNOSIS — M160 Bilateral primary osteoarthritis of hip: Secondary | ICD-10-CM | POA: Diagnosis not present

## 2022-06-05 DIAGNOSIS — I2510 Atherosclerotic heart disease of native coronary artery without angina pectoris: Secondary | ICD-10-CM | POA: Insufficient documentation

## 2022-06-05 DIAGNOSIS — I10 Essential (primary) hypertension: Secondary | ICD-10-CM | POA: Insufficient documentation

## 2022-06-05 DIAGNOSIS — I700 Atherosclerosis of aorta: Secondary | ICD-10-CM | POA: Diagnosis not present

## 2022-06-05 DIAGNOSIS — R1032 Left lower quadrant pain: Secondary | ICD-10-CM | POA: Diagnosis not present

## 2022-06-05 DIAGNOSIS — N132 Hydronephrosis with renal and ureteral calculous obstruction: Secondary | ICD-10-CM | POA: Diagnosis not present

## 2022-06-05 DIAGNOSIS — N200 Calculus of kidney: Secondary | ICD-10-CM | POA: Diagnosis not present

## 2022-06-05 DIAGNOSIS — K5730 Diverticulosis of large intestine without perforation or abscess without bleeding: Secondary | ICD-10-CM | POA: Diagnosis not present

## 2022-06-05 LAB — COMPREHENSIVE METABOLIC PANEL
ALT: 31 U/L (ref 0–44)
AST: 28 U/L (ref 15–41)
Albumin: 3.8 g/dL (ref 3.5–5.0)
Alkaline Phosphatase: 60 U/L (ref 38–126)
Anion gap: 13 (ref 5–15)
BUN: 17 mg/dL (ref 8–23)
CO2: 24 mmol/L (ref 22–32)
Calcium: 9 mg/dL (ref 8.9–10.3)
Chloride: 98 mmol/L (ref 98–111)
Creatinine, Ser: 1.32 mg/dL — ABNORMAL HIGH (ref 0.61–1.24)
GFR, Estimated: 58 mL/min — ABNORMAL LOW (ref >60)
Glucose, Bld: 125 mg/dL — ABNORMAL HIGH (ref 70–99)
Potassium: 3.9 mmol/L (ref 3.5–5.1)
Sodium: 135 mmol/L (ref 135–145)
Total Bilirubin: 0.6 mg/dL (ref 0.3–1.2)
Total Protein: 7.1 g/dL (ref 6.5–8.1)

## 2022-06-05 LAB — CBC
HCT: 43.7 % (ref 39.0–52.0)
Hemoglobin: 14.3 g/dL (ref 13.0–17.0)
MCH: 28.9 pg (ref 26.0–34.0)
MCHC: 32.7 g/dL (ref 30.0–36.0)
MCV: 88.3 fL (ref 80.0–100.0)
Platelets: 227 10*3/uL (ref 150–400)
RBC: 4.95 MIL/uL (ref 4.22–5.81)
RDW: 12.7 % (ref 11.5–15.5)
WBC: 7.2 10*3/uL (ref 4.0–10.5)
nRBC: 0 % (ref 0.0–0.2)

## 2022-06-05 LAB — LIPASE, BLOOD: Lipase: 54 U/L — ABNORMAL HIGH (ref 11–51)

## 2022-06-05 MED ORDER — MORPHINE SULFATE (PF) 4 MG/ML IV SOLN
4.0000 mg | Freq: Once | INTRAVENOUS | Status: AC
  Administered 2022-06-05: 4 mg via INTRAVENOUS
  Filled 2022-06-05: qty 1

## 2022-06-05 MED ORDER — SODIUM CHLORIDE 0.9 % IV BOLUS
1000.0000 mL | Freq: Once | INTRAVENOUS | Status: AC
  Administered 2022-06-05: 1000 mL via INTRAVENOUS

## 2022-06-05 MED ORDER — ONDANSETRON HCL 4 MG/2ML IJ SOLN
4.0000 mg | Freq: Once | INTRAMUSCULAR | Status: AC
  Administered 2022-06-05: 4 mg via INTRAVENOUS

## 2022-06-05 MED ORDER — ONDANSETRON HCL 4 MG/2ML IJ SOLN
4.0000 mg | Freq: Once | INTRAMUSCULAR | Status: DC
  Filled 2022-06-05: qty 2

## 2022-06-05 MED ORDER — KETOROLAC TROMETHAMINE 30 MG/ML IJ SOLN
30.0000 mg | Freq: Once | INTRAMUSCULAR | Status: AC
  Administered 2022-06-05: 30 mg via INTRAVENOUS
  Filled 2022-06-05: qty 1

## 2022-06-05 MED ORDER — ONDANSETRON HCL 4 MG/2ML IJ SOLN
INTRAMUSCULAR | Status: AC
  Filled 2022-06-05: qty 2

## 2022-06-05 NOTE — ED Provider Notes (Signed)
[facility] Provider Note   CSN: [id number] Arrival date & time: 06/05/22  2158     History  Chief Complaint  Patient presents with   Abdominal Pain    Dean Guzman is a 72 y.o. male.  Patient is a 72 year old male with past medical history of coronary artery disease with stent, hiatal hernia repair 1-1/2 weeks ago, hypertension, sleep apnea, hyperlipidemia.  Patient presenting today with complaints of left lower quadrant pain.  This started abruptly earlier this evening.  He describes a constant pain to the left lower quadrant that radiates to his back.  This feels similar to prior kidney stones.  He denies any bowel or bladder complaints.  He denies any fevers or chills.  There are no aggravating or alleviating factors.  The history is provided by the patient.       Home Medications Prior to Admission medications   Medication Sig Start Date End Date Taking? Authorizing Provider  amLODipine (NORVASC) 5 MG tablet Take 1 tablet by mouth daily. 02/22/22   Shelda Pal, DO  aspirin EC 81 MG tablet Take 81 mg by mouth daily. Swallow whole.    [provider]  cholecalciferol (VITAMIN D3) 25 MCG (1000 UT) tablet Take 1,000 Units by mouth daily.    [provider]  diltiazem (CARDIZEM CD) 120 MG 24 hr capsule Take 2 capsules (240 mg total) by mouth daily. 05/24/22 08/22/22  Park Liter, MD  diltiazem (DILACOR XR) 240 MG 24 hr capsule Take 1 capsule by mouth daily. 02/22/22   Shelda Pal, DO  ibuprofen (ADVIL) 200 MG tablet Take 200 mg by mouth every 6 (six) hours as needed for moderate pain.    [provider]  ipratropium (ATROVENT) 0.06 % nasal spray Place 2 sprays into both nostrils 3 (three) times daily as needed for rhinitis. Patient taking differently: Place 2 sprays into both nostrils daily. 02/04/22   Shelda Pal, DO  lisinopril (ZESTRIL) 10 MG tablet Take 1 tablet (10 mg total) by  mouth daily. 10/26/21 10/21/22  Park Liter, MD  nitroGLYCERIN (NITROSTAT) 0.4 MG SL tablet Place 1 tablet (0.4 mg total) under the tongue every 5 (five) minutes as needed for chest pain. Do not use with tadalafil and sildenafil 09/08/21 09/08/22  Margie Billet, NP  Omega-3 Fatty Acids (FISH OIL) 1200 MG CAPS Take 1,200 mg by mouth 2 (two) times daily.    [provider]  ondansetron (ZOFRAN) 4 MG tablet Take 1 tablet (4 mg total) by mouth every 8 (eight) hours as needed for nausea. 05/19/22   Michael Boston, MD  pantoprazole (PROTONIX) 40 MG tablet Take 1 tablet (40 mg total) by mouth 2 (two) times daily. 01/19/22   Shelda Pal, DO  rosuvastatin (CRESTOR) 40 MG tablet Take 1 tablet (40 mg total) by mouth daily. 10/26/21 10/26/22  Park Liter, MD  traMADol (ULTRAM) 50 MG tablet Take 1-2 tablets (50-100 mg total) by mouth every 6 (six) hours as needed for moderate pain or severe pain. 05/19/22   Michael Boston, MD  Vibegron (GEMTESA) 75 MG TABS Take 75 mg by mouth daily.    [provider]      Allergies    Patient has no known allergies.    Review of Systems   Review of Systems  All other systems reviewed and are negative.   Physical Exam Updated Vital Signs BP (!) 145/65 (BP Location: Left Arm)   Pulse 75  Temp 97.6 F (36.4 C) (Oral)   Resp 19   Ht '6\' 2"'$  (1.88 m)   Wt 108.4 kg   SpO2 99%   BMI 30.69 kg/m  Physical Exam Vitals and nursing note reviewed.  Constitutional:      General: Korbyn Guzman is not in acute distress.    Appearance: Dean Guzman is well-developed. Dean Guzman is not diaphoretic.  HENT:     Head: Normocephalic and atraumatic.  Cardiovascular:     Rate and Rhythm: Normal rate and regular rhythm.     Heart sounds: No murmur heard.    No friction rub.  Pulmonary:     Effort: Pulmonary effort is normal. No respiratory distress.     Breath sounds: Normal breath sounds. No wheezing or rales.  Abdominal:     General:  Bowel sounds are normal. There is no distension.     Palpations: Abdomen is soft.     Tenderness: There is abdominal tenderness in the left lower quadrant. There is no right CVA tenderness, left CVA tenderness, guarding or rebound.  Musculoskeletal:        General: Normal range of motion.     Cervical back: Normal range of motion and neck supple.  Skin:    General: Skin is warm and dry.  Neurological:     Mental Status: Dean Guzman is alert and oriented to person, place, and time.     Coordination: Coordination normal.     ED Results / Procedures / Treatments   Labs (all labs ordered are listed, but only abnormal results are displayed) Labs Reviewed  LIPASE, BLOOD - Abnormal; Notable for the following components:      Result Value   Lipase 54 (*)    All other components within normal limits  COMPREHENSIVE METABOLIC PANEL - Abnormal; Notable for the following components:   Glucose, Bld 125 (*)    Creatinine, Ser 1.32 (*)    GFR, Estimated 58 (*)    All other components within normal limits  CBC  URINALYSIS, ROUTINE W REFLEX MICROSCOPIC    EKG None  Radiology No results found.  Procedures Procedures  {Document cardiac monitor, telemetry assessment procedure when appropriate:1}  Medications Ordered in ED Medications  sodium chloride 0.9 % bolus 1,000 mL (has no administration in time range)  ondansetron (ZOFRAN) injection 4 mg (has no administration in time range)  morphine (PF) 4 MG/ML injection 4 mg (has no administration in time range)  ketorolac (TORADOL) 30 MG/ML injection 30 mg (has no administration in time range)  ondansetron (ZOFRAN) injection 4 mg (4 mg Intravenous Given 06/05/22 2234)    ED Course/ Medical Decision Making/ A&P                           Medical Decision Making Amount and/or Complexity of Data Reviewed Labs: ordered. Radiology: ordered.  Risk Prescription drug management.   ***  {Document critical care time when  appropriate:1} {Document review of labs and clinical decision tools ie heart score, Chads2Vasc2 etc:1}  {Document your independent review of radiology images, and any outside records:1} {Document your discussion with family members, caretakers, and with consultants:1} {Document social determinants of health affecting pt's care:1} {Document your decision making why or why not admission, treatments were needed:1} Final Clinical Impression(s) / ED Diagnoses Final diagnoses:  None    Rx / DC Orders ED Discharge Orders     None

## 2022-06-05 NOTE — ED Triage Notes (Signed)
Pt reports severe LLQ abd pain, onset 7:30pm tonight. Hx of hiatal hernia repair 2 weeks ago. He reports feeling nauseous and he took '4mg'$  of Zofran at 9pm.   Denies flank pain but states he has a hx of kidney stones and states this pain feels similar.

## 2022-06-05 NOTE — ED Notes (Signed)
Pt transported to CT ?

## 2022-06-06 ENCOUNTER — Encounter: Payer: Self-pay | Admitting: Cardiology

## 2022-06-06 MED ORDER — TAMSULOSIN HCL 0.4 MG PO CAPS: 0.4000 mg | ORAL_CAPSULE | Freq: Every day | ORAL | 0 refills | Status: DC

## 2022-06-06 MED ORDER — OXYCODONE-ACETAMINOPHEN 5-325 MG PO TABS: 1.0000 | ORAL_TABLET | Freq: Four times a day (QID) | ORAL | 0 refills | Status: DC | PRN

## 2022-06-06 MED ORDER — OXYCODONE-ACETAMINOPHEN 5-325 MG PO TABS
2.0000 | ORAL_TABLET | Freq: Once | ORAL | Status: AC
  Administered 2022-06-06: 2 via ORAL
  Filled 2022-06-06: qty 2

## 2022-06-06 MED ORDER — ONDANSETRON 8 MG PO TBDP: ORAL_TABLET | ORAL | 0 refills | Status: DC

## 2022-06-06 NOTE — Discharge Instructions (Signed)
Begin taking Percocet as prescribed as needed for pain and Zofran as prescribed as needed for nausea.  Begin taking Flomax as prescribed.  Follow-up with urology if your symptoms have not improved in the next few days.  The contact information for [facility] has been provided in this discharge summary for you to call and make these arrangements.

## 2022-06-06 NOTE — ED Notes (Signed)
D/c paperwork reviewed with pt, including prescriptions and follow up care.  Pt reports that prescriptions are at pharmacy that is currently closed, asking about pain control tonight prior to getting prescriptions. EDP [facility] made aware. All questions and/or concerns addressed at time of d/c.  No further needs expressed. . Pt verbalized understanding, Ambulatory with family to ED exit, NAD.

## 2022-06-07 ENCOUNTER — Ambulatory Visit: Payer: PPO | Admitting: Family Medicine

## 2022-06-08 ENCOUNTER — Encounter (HOSPITAL_BASED_OUTPATIENT_CLINIC_OR_DEPARTMENT_OTHER): Payer: Self-pay | Admitting: Urology

## 2022-06-08 ENCOUNTER — Other Ambulatory Visit: Payer: Self-pay | Admitting: Urology

## 2022-06-08 DIAGNOSIS — N201 Calculus of ureter: Secondary | ICD-10-CM | POA: Diagnosis not present

## 2022-06-08 NOTE — Progress Notes (Signed)
Pre-op phone call attempted. Left voicemail for patient to return phone call.

## 2022-06-08 NOTE — Progress Notes (Signed)
Spoke w/ via phone for pre-op interview--- [facility] Lab needs dos---- [facility]            Lab results------ COVID test -----patient states asymptomatic no test needed Arrive at -------0600 NPO after MN NO Solid Food.  Clear liquids from MN until--- Med rec completed Medications to take morning of surgery ----- pain meds as needed Diabetic medication ----- noone Patient instructed no nail polish to be worn day of surgery Patient instructed to bring photo id and insurance card day of surgery Patient aware to have Driver (ride ) / caregiver    for 24 hours after surgery  Patient Special Instructions ----- Pre-Op special Istructions ----- Patient verbalized understanding of instructions that were given at this phone interview. Patient denies shortness of breath, chest pain, fever, cough at this phone interview.

## 2022-06-09 ENCOUNTER — Other Ambulatory Visit: Payer: Self-pay

## 2022-06-09 ENCOUNTER — Telehealth: Payer: Self-pay

## 2022-06-09 DIAGNOSIS — E782 Mixed hyperlipidemia: Secondary | ICD-10-CM

## 2022-06-09 MED ORDER — EZETIMIBE 10 MG PO TABS: 10.0000 mg | ORAL_TABLET | Freq: Every day | ORAL | 3 refills | Status: DC

## 2022-06-09 MED ORDER — ROSUVASTATIN CALCIUM 20 MG PO TABS: 20.0000 mg | ORAL_TABLET | Freq: Every day | ORAL | 3 refills | Status: DC

## 2022-06-09 NOTE — Telephone Encounter (Signed)
Per Dr. Agustin Cree pt will decrease Crestor to '20mg'$  and add Zetia '10mg'$  daily. RX sent to pharmacy. Pt agreed and verbalized understanding.

## 2022-06-09 NOTE — H&P (Signed)
Left ureteral calculus   Dean Guzman is a 72 year old gentleman who is followed by Dr. Gloriann Guzman for prostate cancer. He presents today with a new complaint of severe left-sided flank pain that began on Sunday. This persisted causing him to present to the emergency department a few days ago. His CT stone study revealed a 7 mm left UVJ stone. He has had associated nausea and vomiting but has been trying not to vomit as he is status post a hiatal hernia repair approximately 3 weeks ago. He denies any fever. His pain has been modestly controlled with oral pain medication. He rates his pain today as a 6 out of 10 after taking Percocet. He is taking tamsulosin as well. He is no longer on Plavix. He does take aspirin 81 mg status post cardiac stent placement in the past.     ALLERGIES: Tamsulosin - Dizziness    MEDICATIONS: Aspirin  Gemtesa 75 mg tablet 1 tablet PO Daily  Gemtesa 75 mg tablet TAKE ONE TABLET BY MOUTH DAILY  Lisinopril 10 mg tablet  Amlodipine Besylate 5 mg tablet  Diltiazem 24Hr Er (Cd) 240 mg capsule, ext release 24 hr  Fish Oil  Pantoprazole Sodium 40 mg tablet, delayed release  Rosuvastatin Calcium 5 mg tablet  Sildenafil Citrate 20 mg tablet Take 1-5 tablets PO daily PRN 30-60 minutes before planned activity.     GU PSH: Cystoscopy - 12/16/2021 Penile Injection - 01/05/2022 Prostate Needle Biopsy - 2021, 2019 TRANSPERI NEEDLE PLACE, PROS - 01/13/2020 Transperineal Plmt Biodegradable Matrl 1/Mlt Njx - 01/13/2020 Vasectomy       PSH Notes: Vasectomy reversal  Wisdom teeth    lt hand 3rd finger   NON-GU PSH: Cardiac Stent Placement Eye Surgery Procedure Surgical Pathology, Gross And Microscopic Examination For Prostate Needle - 2021, 2019     GU PMH: Dysuria - 02/09/2022, - 12/16/2021, - 10/29/2021 ED due to arterial insufficiency - 02/09/2022, - 01/05/2022, - 12/16/2021, - 08/05/2021, - 02/04/2021, - 08/05/2020, - 05/07/2020 Prostate Cancer - 02/09/2022, - 12/16/2021, - 10/29/2021, -  08/05/2021, - 02/04/2021, - 08/05/2020, - 05/07/2020, - 02/04/2020, - 2021 Urinary Frequency - 02/09/2022, - 12/16/2021 (Stable), - 10/29/2021, - 08/05/2021 Urinary Urgency - 02/09/2022, - 12/16/2021 (Stable), - 10/29/2021 (Stable), - 08/05/2021, - 2019 Nocturia - 12/16/2021, - 08/05/2021, - 02/04/2021, - 08/05/2020, - 05/07/2020, - 2021 Weak Urinary Stream - 12/16/2021, - 10/29/2021 BPH w/LUTS - 08/05/2021, - 2019 Elevated PSA - 2019, - 2019 BPH w/o LUTS - 2019 Renal calculus      PMH Notes: Hiatal hernia  Barrett's esophagus   NON-GU PMH: GERD Hypercholesterolemia Hypertension    FAMILY HISTORY: 2 daughters - Other 1 son - Other Arthritis - Father Breast Cancer - Sister Heart Disease - Mother   SOCIAL HISTORY: Marital Status: Married Preferred Language: English; Race: White Current Smoking Status: Patient does not smoke anymore. Has not smoked since 11/28/1979.   Tobacco Use Assessment Completed: Used Tobacco in last 30 days? Does not use smokeless tobacco. Does drink.  Does not use drugs. Drinks 2 caffeinated drinks per day. Has not had a blood transfusion.     Notes: ETOH 1 drink per week    REVIEW OF SYSTEMS:    GU Review Male:   Patient denies frequent urination, hard to postpone urination, burning/ pain with urination, get up at night to urinate, leakage of urine, stream starts and stops, trouble starting your streams, and have to strain to urinate .  Gastrointestinal (Lower):   Patient denies diarrhea and constipation.  Gastrointestinal (Upper):   Patient denies nausea and vomiting.  Constitutional:   Patient denies fever, night sweats, weight loss, and fatigue.  Skin:   Patient denies skin rash/ lesion and itching.  Eyes:   Patient denies blurred vision and double vision.  Ears/ Nose/ Throat:   Patient denies sore throat and sinus problems.  Hematologic/Lymphatic:   Patient denies swollen glands and easy bruising.  Cardiovascular:   Patient denies leg swelling and chest pains.  Respiratory:    Patient denies cough and shortness of breath.  Endocrine:   Patient denies excessive thirst.  Musculoskeletal:   Patient denies back pain and joint pain.  Neurological:   Patient denies dizziness and headaches.  Psychologic:   Patient denies depression and anxiety.   VITAL SIGNS:      06/08/2022 12:44 PM  Weight 225 lb / 102.06 kg  Height 74 in / 187.96 cm  BP 117/64 mmHg  Pulse 67 /min  Temperature 98.2 F / 36.7 C  BMI 28.9 kg/m   MULTI-SYSTEM PHYSICAL EXAMINATION:    Constitutional: Well-nourished. No physical deformities. Normally developed. Good grooming.  Respiratory: No labored breathing, no use of accessory muscles.   Cardiovascular: Normal temperature, normal extremity pulses, no swelling, no varicosities.  Gastrointestinal: No mass, no tenderness, no rigidity, non obese abdomen. Moderate left CVA tenderness.     Complexity of Data:  X-Ray Review: C.T. Abdomen/Pelvis: Reviewed Films.     02/02/22 07/29/21 01/26/21 07/29/20 04/30/20 08/08/19 08/02/18  PSA  Total PSA 0.10 ng/mL 0.11 ng/mL 0.18 ng/mL 0.32 ng/mL 0.91 ng/mL 6.09 ng/mL 4.69 ng/mL   Notes:                     CLINICAL DATA: Abdominal/flank pain, stone suspected. Left lower  quadrant abdominal pain. History of hiatal hernia repair 2 weeks  ago.   EXAM:  CT ABDOMEN AND PELVIS WITHOUT CONTRAST   TECHNIQUE:  Multidetector CT imaging of the abdomen and pelvis was performed  following the standard protocol without IV contrast.   RADIATION DOSE REDUCTION: This exam was performed according to the  departmental dose-optimization program which includes automated  exposure control, adjustment of the mA and/or kV according to  patient size and/or use of iterative reconstruction technique.   COMPARISON: CT examination dated January 17, 2022   FINDINGS:  Lower chest: Lung bases are clear. Coronary artery atherosclerotic  calcifications acute.   Hepatobiliary: No focal liver abnormality is seen. No gallstones,   gallbladder wall thickening, or biliary dilatation.   Pancreas: Unremarkable. No pancreatic ductal dilatation or  surrounding inflammatory changes.   Spleen: Normal in size without focal abnormality.   Adrenals/Urinary Tract: Adrenals are unremarkable. Moderate left  hydroureteronephrosis secondary to a 7 x 7 mm calculus at the left  ureterovesical junction. Right kidney and ureter are unremarkable.  Urinary bladder is unremarkable.   Stomach/Bowel: Postsurgical changes about the GE junction suggesting  recent hernia repair. Stomach is within normal limits. Appendix  appears normal. Sigmoid colonic diverticulosis without evidence of  acute diverticulitis..   Vascular/Lymphatic: Aortic atherosclerosis. No enlarged abdominal or  pelvic lymph nodes.   Reproductive: Multiple brachytherapy seeds in the prostate.   Other: No abdominal wall hernia or abnormality. No abdominopelvic  ascites.   Musculoskeletal: Bilateral hip osteoarthritis.   IMPRESSION:  1. Moderate left hydroureteronephrosis secondary to a 7 x 7 mm  calculus at the left ureterovesical junction.  2. Sigmoid colonic diverticulosis without evidence of acute  diverticulitis.  3. Postsurgical changes about the  GE junction suggesting recent  hernia repair.  4. Additional chronic findings as above.  5. Aortic atherosclerosis.   Aortic Atherosclerosis (ICD10-I70.0).    Electronically Signed  By: Keane Police D.O.   I independently reviewed his KUB x-ray today. This demonstrates a radiopaque 7-8 mm stone in the vicinity of the left ureterovesical junction.  On: 06/05/2022 23:43   PROCEDURES:         KUB - 74018  A single view of the abdomen is obtained.      Patient confirmed No Neulasta OnPro Device.           Urinalysis Dipstick Dipstick Cont'd  Color: Yellow Bilirubin: Neg mg/dL  Appearance: Clear Ketones: Trace mg/dL  Specific Gravity: 1.025 Blood: Neg ery/uL  pH: 5.5 Protein: Trace mg/dL  Glucose:  Neg mg/dL Urobilinogen: 0.2 mg/dL    Nitrites: Neg    Leukocyte Esterase: Neg leu/uL         Ketoralac '60mg'$  - [id number], [id number] Pt was prepped and cleaned. Ketoralac '60mg'$  was injected IM in the left buttock. Zero wasted. Pt tolerated procedure well.    Qty: 60 Adm. By: Rene Paci  Unit: mg Lot No [id number]  Route: IM Exp. Date 07/29/2023  Freq: None Mfgr.:   Site: Left Buttock   ASSESSMENT:      ICD-10 Details  1 GU:   Ureteral calculus - N20.1    PLAN:            Medications New Meds: Ketorolac Tromethamine 10 mg tablet 1 tablet PO Q 8 H PRN   #15  0 Refill(s)  Pharmacy Name:  [id number] Address:  [address] [place] [address] Phone:  [phone] Fax:  [phone]           Orders X-Rays: KUB          Schedule Return Visit/Planned Activity: Other See Visit Notes             Note: Will schedule surgery.          Document Letter(s):  Created for Patient: Clinical Summary         Notes:   1. Left UVJ calculus: He was administered ketorolac IM today which controlled his pain. He was given a prescription for oral ketorolac to see if this would be more effective than his current regimen with Percocet. In the meantime, he would like to proceed with definitive therapy. We reviewed options including ureteroscopic laser lithotripsy and shockwave lithotripsy. After reviewing the pros and cons of each approach, he does to shockwave lithotripsy for treatment. This will be scheduled for early Friday morning. Considering this is a distal stone, he should be able to remain on his 81 mg of aspirin for his history of a cardiac stent and take ketorolac. We have reviewed the potential risks, complications, and expected recovery process associated with this procedure. He gives informed consent to proceed.

## 2022-06-10 ENCOUNTER — Encounter (HOSPITAL_BASED_OUTPATIENT_CLINIC_OR_DEPARTMENT_OTHER): Payer: Self-pay | Admitting: Urology

## 2022-06-10 ENCOUNTER — Ambulatory Visit (HOSPITAL_BASED_OUTPATIENT_CLINIC_OR_DEPARTMENT_OTHER)
Admission: RE | Admit: 2022-06-10 | Discharge: 2022-06-10 | Disposition: A | Discharge status: Home / Self Care | Payer: PPO | Attending: Urology | Admitting: Urology

## 2022-06-10 ENCOUNTER — Ambulatory Visit (HOSPITAL_COMMUNITY): Payer: PPO

## 2022-06-10 ENCOUNTER — Encounter (HOSPITAL_BASED_OUTPATIENT_CLINIC_OR_DEPARTMENT_OTHER)
Admission: RE | Disposition: A | Discharge status: Home / Self Care | Payer: Self-pay | Source: Home / Self Care | Attending: Urology

## 2022-06-10 DIAGNOSIS — Z7901 Long term (current) use of anticoagulants: Secondary | ICD-10-CM | POA: Diagnosis not present

## 2022-06-10 DIAGNOSIS — Z955 Presence of coronary angioplasty implant and graft: Secondary | ICD-10-CM | POA: Diagnosis not present

## 2022-06-10 DIAGNOSIS — I878 Other specified disorders of veins: Secondary | ICD-10-CM | POA: Diagnosis not present

## 2022-06-10 DIAGNOSIS — I10 Essential (primary) hypertension: Secondary | ICD-10-CM | POA: Insufficient documentation

## 2022-06-10 DIAGNOSIS — N201 Calculus of ureter: Secondary | ICD-10-CM | POA: Diagnosis not present

## 2022-06-10 HISTORY — PX: EXTRACORPOREAL SHOCK WAVE LITHOTRIPSY: SHX1557

## 2022-06-10 SURGERY — LITHOTRIPSY, ESWL
Anesthesia: LOCAL | Laterality: Left

## 2022-06-10 MED ORDER — DIAZEPAM 5 MG PO TABS
ORAL_TABLET | ORAL | Status: AC
  Filled 2022-06-10: qty 2

## 2022-06-10 MED ORDER — DIPHENHYDRAMINE HCL 25 MG PO CAPS
ORAL_CAPSULE | ORAL | Status: AC
  Filled 2022-06-10: qty 1

## 2022-06-10 MED ORDER — CIPROFLOXACIN HCL 500 MG PO TABS
ORAL_TABLET | ORAL | Status: AC
  Filled 2022-06-10: qty 1

## 2022-06-10 MED ORDER — CIPROFLOXACIN HCL 500 MG PO TABS
500.0000 mg | ORAL_TABLET | ORAL | Status: AC
  Administered 2022-06-10: 500 mg via ORAL

## 2022-06-10 MED ORDER — SODIUM CHLORIDE 0.9 % IV SOLN: INTRAVENOUS | Status: DC

## 2022-06-10 MED ORDER — DIPHENHYDRAMINE HCL 25 MG PO CAPS
25.0000 mg | ORAL_CAPSULE | ORAL | Status: AC
  Administered 2022-06-10: 25 mg via ORAL

## 2022-06-10 MED ORDER — DIAZEPAM 5 MG PO TABS
10.0000 mg | ORAL_TABLET | ORAL | Status: AC
  Administered 2022-06-10: 10 mg via ORAL

## 2022-06-10 NOTE — Op Note (Signed)
[facility] Operative Note  Treating Physician: Remi Haggard, MD  Pre-op diagnosis: Left UVJ stone  Post-op diagnosis: Same   Procedure: In situ left ureteral ESWL  IPP:[id number]  See Ar[facility] Everts OP note scanned into chart. Also because of the size, density, location and other factors that cannot be anticipated I feel this will likely be a staged procedure. This fact supersedes any indication in the scanned [place] stone operative note to the contrary.  [facility]

## 2022-06-10 NOTE — Discharge Instructions (Signed)
  Post Anesthesia Home Care Instructions ? ?Activity: ?Get plenty of rest for the remainder of the day. A responsible individual must stay with you for 24 hours following the procedure.  ?For the next 24 hours, DO NOT: ?-Drive a car ?-Operate machinery ?-Drink alcoholic beverages ?-Take any medication unless instructed by your physician ?-Make any legal decisions or sign important papers. ? ?Meals: ?Start with liquid foods such as gelatin or soup. Progress to regular foods as tolerated. Avoid greasy, spicy, heavy foods. If nausea and/or vomiting occur, drink only clear liquids until the nausea and/or vomiting subsides. Call your physician if vomiting continues. ? ?Special Instructions/Symptoms: ?Your throat may feel dry or sore from the anesthesia or the breathing tube placed in your throat during surgery. If this causes discomfort, gargle with warm salt water. The discomfort should disappear within 24 hours. ? ? ?    ?

## 2022-06-13 ENCOUNTER — Other Ambulatory Visit: Payer: Self-pay

## 2022-06-13 ENCOUNTER — Encounter (HOSPITAL_COMMUNITY): Payer: Self-pay | Admitting: Urology

## 2022-06-13 ENCOUNTER — Other Ambulatory Visit: Payer: Self-pay | Admitting: Urology

## 2022-06-13 DIAGNOSIS — N201 Calculus of ureter: Secondary | ICD-10-CM | POA: Diagnosis not present

## 2022-06-13 NOTE — Progress Notes (Signed)
For Anesthesia: PCP - Shelda Pal, DO  Cardiologist - Park Liter, MD   Chest x-ray - 03/01/22 in [facility] EKG - 01/17/22 in [facility] Stress Test - 08/25/21 in [facility] ECHO - 07/22/21 in [facility] Cardiac Cath - 09/08/21 in [facility] Pacemaker/ICD device last checked: N/A Pacemaker orders received: N/A Device Rep notified: N/A  Spinal Cord Stimulator: N/A  Sleep Study - Yes CPAP - No  Fasting Blood Sugar -  N/A Checks Blood Sugar ___N/A__ times a day Date and result of last Hgb A1c- N/A  Last dose of GLP1 agonist-  N/A GLP1 instructions:  N/A  Last dose of SGLT-2 inhibitors-  N/A SGLT-2 instructions: N/A  Blood Thinner Instructions:  Aspirin Instructions: patient states instructed to remain on aspirin Last Dose:  Activity level:  Able to exercise without chest pain and/or shortness of breath      Anesthesia review: N/A  Patient denies shortness of breath, fever, cough and chest pain at PAT appointment   Patient verbalized understanding of instructions reviewed via telephone.

## 2022-06-15 ENCOUNTER — Ambulatory Visit (HOSPITAL_BASED_OUTPATIENT_CLINIC_OR_DEPARTMENT_OTHER): Payer: PPO | Admitting: Anesthesiology

## 2022-06-15 ENCOUNTER — Encounter (HOSPITAL_COMMUNITY)
Admission: RE | Disposition: A | Discharge status: Home / Self Care | Payer: Self-pay | Source: Ambulatory Visit | Attending: Urology

## 2022-06-15 ENCOUNTER — Ambulatory Visit (HOSPITAL_COMMUNITY): Payer: PPO | Admitting: Anesthesiology

## 2022-06-15 ENCOUNTER — Encounter (HOSPITAL_COMMUNITY): Payer: Self-pay | Admitting: Urology

## 2022-06-15 ENCOUNTER — Ambulatory Visit (HOSPITAL_COMMUNITY)
Admission: RE | Admit: 2022-06-15 | Discharge: 2022-06-15 | Disposition: A | Discharge status: Home / Self Care | Payer: PPO | Source: Ambulatory Visit | Attending: Urology | Admitting: Urology

## 2022-06-15 ENCOUNTER — Other Ambulatory Visit: Payer: Self-pay

## 2022-06-15 ENCOUNTER — Ambulatory Visit (HOSPITAL_COMMUNITY): Payer: PPO

## 2022-06-15 DIAGNOSIS — K219 Gastro-esophageal reflux disease without esophagitis: Secondary | ICD-10-CM | POA: Insufficient documentation

## 2022-06-15 DIAGNOSIS — N201 Calculus of ureter: Secondary | ICD-10-CM | POA: Diagnosis not present

## 2022-06-15 DIAGNOSIS — I2510 Atherosclerotic heart disease of native coronary artery without angina pectoris: Secondary | ICD-10-CM | POA: Diagnosis not present

## 2022-06-15 DIAGNOSIS — Z832 Family history of diseases of the blood and blood-forming organs and certain disorders involving the immune mechanism: Secondary | ICD-10-CM

## 2022-06-15 DIAGNOSIS — Z87891 Personal history of nicotine dependence: Secondary | ICD-10-CM

## 2022-06-15 DIAGNOSIS — C61 Malignant neoplasm of prostate: Secondary | ICD-10-CM | POA: Diagnosis not present

## 2022-06-15 DIAGNOSIS — N132 Hydronephrosis with renal and ureteral calculous obstruction: Secondary | ICD-10-CM | POA: Diagnosis not present

## 2022-06-15 DIAGNOSIS — Z7982 Long term (current) use of aspirin: Secondary | ICD-10-CM | POA: Diagnosis not present

## 2022-06-15 DIAGNOSIS — I10 Essential (primary) hypertension: Secondary | ICD-10-CM | POA: Insufficient documentation

## 2022-06-15 DIAGNOSIS — Z79899 Other long term (current) drug therapy: Secondary | ICD-10-CM | POA: Diagnosis not present

## 2022-06-15 DIAGNOSIS — Z955 Presence of coronary angioplasty implant and graft: Secondary | ICD-10-CM | POA: Diagnosis not present

## 2022-06-15 DIAGNOSIS — N1330 Unspecified hydronephrosis: Secondary | ICD-10-CM | POA: Diagnosis not present

## 2022-06-15 HISTORY — DX: Sleep apnea, unspecified: G47.30

## 2022-06-15 HISTORY — PX: CYSTOSCOPY/URETEROSCOPY/HOLMIUM LASER/STENT PLACEMENT: SHX6546

## 2022-06-15 LAB — BASIC METABOLIC PANEL
Anion gap: 11 (ref 5–15)
BUN: 16 mg/dL (ref 8–23)
CO2: 26 mmol/L (ref 22–32)
Calcium: 8.8 mg/dL — ABNORMAL LOW (ref 8.9–10.3)
Chloride: 101 mmol/L (ref 98–111)
Creatinine, Ser: 1.71 mg/dL — ABNORMAL HIGH (ref 0.61–1.24)
GFR, Estimated: 42 mL/min — ABNORMAL LOW (ref >60)
Glucose, Bld: 107 mg/dL — ABNORMAL HIGH (ref 70–99)
Potassium: 3.9 mmol/L (ref 3.5–5.1)
Sodium: 138 mmol/L (ref 135–145)

## 2022-06-15 LAB — CBC
HCT: 38.3 % — ABNORMAL LOW (ref 39.0–52.0)
Hemoglobin: 12.2 g/dL — ABNORMAL LOW (ref 13.0–17.0)
MCH: 28.3 pg (ref 26.0–34.0)
MCHC: 31.9 g/dL (ref 30.0–36.0)
MCV: 88.9 fL (ref 80.0–100.0)
Platelets: 190 10*3/uL (ref 150–400)
RBC: 4.31 MIL/uL (ref 4.22–5.81)
RDW: 12.8 % (ref 11.5–15.5)
WBC: 4.9 10*3/uL (ref 4.0–10.5)
nRBC: 0 % (ref 0.0–0.2)

## 2022-06-15 SURGERY — CYSTOSCOPY/URETEROSCOPY/HOLMIUM LASER/STENT PLACEMENT
Anesthesia: General | Site: Ureter | Laterality: Left

## 2022-06-15 MED ORDER — ORAL CARE MOUTH RINSE: 15.0000 mL | Freq: Once | OROMUCOSAL | Status: AC

## 2022-06-15 MED ORDER — OXYCODONE HCL 5 MG/5ML PO SOLN: 5.0000 mg | Freq: Once | ORAL | Status: DC | PRN

## 2022-06-15 MED ORDER — FENTANYL CITRATE (PF) 100 MCG/2ML IJ SOLN
INTRAMUSCULAR | Status: AC
  Filled 2022-06-15: qty 2

## 2022-06-15 MED ORDER — PROPOFOL 10 MG/ML IV BOLUS
INTRAVENOUS | Status: AC
  Filled 2022-06-15: qty 20

## 2022-06-15 MED ORDER — ONDANSETRON HCL 4 MG/2ML IJ SOLN: 4.0000 mg | Freq: Once | INTRAMUSCULAR | Status: DC | PRN

## 2022-06-15 MED ORDER — PHENYLEPHRINE 80 MCG/ML (10ML) SYRINGE FOR IV PUSH (FOR BLOOD PRESSURE SUPPORT)
PREFILLED_SYRINGE | INTRAVENOUS | Status: AC
  Filled 2022-06-15: qty 20

## 2022-06-15 MED ORDER — FENTANYL CITRATE (PF) 250 MCG/5ML IJ SOLN
INTRAMUSCULAR | Status: DC | PRN
  Administered 2022-06-15 (×2): 25 ug via INTRAVENOUS

## 2022-06-15 MED ORDER — LIDOCAINE 2% (20 MG/ML) 5 ML SYRINGE
INTRAMUSCULAR | Status: DC | PRN
  Administered 2022-06-15: 100 mg via INTRAVENOUS

## 2022-06-15 MED ORDER — KETOROLAC TROMETHAMINE 10 MG PO TABS: 10.0000 mg | ORAL_TABLET | Freq: Three times a day (TID) | ORAL | 0 refills | Status: DC | PRN

## 2022-06-15 MED ORDER — SODIUM CHLORIDE 0.9 % IR SOLN
Status: DC | PRN
  Administered 2022-06-15: 3000 mL

## 2022-06-15 MED ORDER — ACETAMINOPHEN 10 MG/ML IV SOLN: 1000.0000 mg | Freq: Once | INTRAVENOUS | Status: DC | PRN

## 2022-06-15 MED ORDER — CHLORHEXIDINE GLUCONATE 0.12 % MT SOLN
15.0000 mL | Freq: Once | OROMUCOSAL | Status: AC
  Administered 2022-06-15: 15 mL via OROMUCOSAL

## 2022-06-15 MED ORDER — PHENYLEPHRINE 80 MCG/ML (10ML) SYRINGE FOR IV PUSH (FOR BLOOD PRESSURE SUPPORT)
PREFILLED_SYRINGE | INTRAVENOUS | Status: AC
  Filled 2022-06-15: qty 10

## 2022-06-15 MED ORDER — IOHEXOL 300 MG/ML  SOLN
INTRAMUSCULAR | Status: DC | PRN
  Administered 2022-06-15: 10 mL via URETHRAL

## 2022-06-15 MED ORDER — OXYCODONE HCL 5 MG PO TABS: 5.0000 mg | ORAL_TABLET | Freq: Once | ORAL | Status: DC | PRN

## 2022-06-15 MED ORDER — DEXAMETHASONE SODIUM PHOSPHATE 10 MG/ML IJ SOLN
INTRAMUSCULAR | Status: DC | PRN
  Administered 2022-06-15: 10 mg via INTRAVENOUS

## 2022-06-15 MED ORDER — DEXAMETHASONE SODIUM PHOSPHATE 10 MG/ML IJ SOLN
INTRAMUSCULAR | Status: AC
  Filled 2022-06-15: qty 1

## 2022-06-15 MED ORDER — PROPOFOL 10 MG/ML IV BOLUS
INTRAVENOUS | Status: DC | PRN
  Administered 2022-06-15: 150 mg via INTRAVENOUS

## 2022-06-15 MED ORDER — LIDOCAINE HCL (PF) 2 % IJ SOLN
INTRAMUSCULAR | Status: AC
  Filled 2022-06-15: qty 5

## 2022-06-15 MED ORDER — ONDANSETRON HCL 4 MG/2ML IJ SOLN
INTRAMUSCULAR | Status: AC
  Filled 2022-06-15: qty 2

## 2022-06-15 MED ORDER — ONDANSETRON HCL 4 MG/2ML IJ SOLN
INTRAMUSCULAR | Status: DC | PRN
  Administered 2022-06-15: 4 mg via INTRAVENOUS

## 2022-06-15 MED ORDER — CEFAZOLIN SODIUM-DEXTROSE 2-4 GM/100ML-% IV SOLN
2.0000 g | INTRAVENOUS | Status: AC
  Administered 2022-06-15: 2 g via INTRAVENOUS
  Filled 2022-06-15: qty 100

## 2022-06-15 MED ORDER — LACTATED RINGERS IV SOLN: INTRAVENOUS | Status: DC

## 2022-06-15 MED ORDER — FENTANYL CITRATE PF 50 MCG/ML IJ SOSY: 25.0000 ug | PREFILLED_SYRINGE | INTRAMUSCULAR | Status: DC | PRN

## 2022-06-15 SURGICAL SUPPLY — 23 items
BAG URO CATCHER STRL LF (MISCELLANEOUS) ×1 IMPLANT
BASKET LASER NITINOL 1.9FR (BASKET) IMPLANT
BASKET ZERO TIP NITINOL 2.4FR (BASKET) IMPLANT
CATH URETERAL DUAL LUMEN 10F (MISCELLANEOUS) IMPLANT
CATH URETL OPEN END 6FR 70 (CATHETERS) ×1 IMPLANT
CLOTH BEACON ORANGE TIMEOUT ST (SAFETY) ×1 IMPLANT
EXTRACTOR STONE 1.7FRX115CM (UROLOGICAL SUPPLIES) IMPLANT
GLOVE BIO SURGEON STRL SZ7.5 (GLOVE) ×1 IMPLANT
GOWN STRL REUS W/ TWL XL LVL3 (GOWN DISPOSABLE) ×1 IMPLANT
GOWN STRL REUS W/TWL XL LVL3 (GOWN DISPOSABLE) ×1
GUIDEWIRE ANG ZIPWIRE 038X150 (WIRE) IMPLANT
GUIDEWIRE STR DUAL SENSOR (WIRE) ×1 IMPLANT
KIT TURNOVER KIT A (KITS) IMPLANT
LASER FIB FLEXIVA PULSE ID 365 (Laser) IMPLANT
MANIFOLD NEPTUNE II (INSTRUMENTS) ×1 IMPLANT
PACK CYSTO (CUSTOM PROCEDURE TRAY) ×1 IMPLANT
SHEATH NAVIGATOR HD 11/13X28 (SHEATH) IMPLANT
SHEATH NAVIGATOR HD 11/13X36 (SHEATH) IMPLANT
STENT URET 6FRX26 CONTOUR (STENTS) IMPLANT
TRACTIP FLEXIVA PULS ID 200XHI (Laser) IMPLANT
TRACTIP FLEXIVA PULSE ID 200 (Laser) IMPLANT
TUBING CONNECTING 10 (TUBING) ×1 IMPLANT
TUBING UROLOGY SET (TUBING) ×1 IMPLANT

## 2022-06-15 NOTE — Anesthesia Procedure Notes (Signed)
Procedure Name: LMA Insertion Date/Time: 06/15/2022 12:48 PM  Performed by: Jenne Campus, CRNAPre-anesthesia Checklist: Patient identified, Emergency Drugs available, Suction available and Patient being monitored Patient Re-evaluated:Patient Re-evaluated prior to induction Oxygen Delivery Method: Circle System Utilized Preoxygenation: Pre-oxygenation with 100% oxygen Induction Type: IV induction Ventilation: Mask ventilation without difficulty LMA: LMA inserted and LMA with gastric port inserted LMA Size: 4.0 Number of attempts: 1 Airway Equipment and Method: Bite block Placement Confirmation: positive ETCO2 and breath sounds checked- equal and bilateral Tube secured with: Tape Dental Injury: Teeth and Oropharynx as per pre-operative assessment

## 2022-06-15 NOTE — Anesthesia Preprocedure Evaluation (Addendum)
Anesthesia Evaluation  Patient identified by MRN, date of birth, ID band Patient awake    Reviewed: Allergy & Precautions, H&P , NPO status , Patient's Chart, lab work & pertinent test results  Airway Mallampati: II  TM Distance: >3 FB Neck ROM: Full    Dental no notable dental hx. (+) Dental Advisory Given   Pulmonary former smoker   Pulmonary exam normal breath sounds clear to auscultation       Cardiovascular hypertension, + CAD and + Cardiac Stents  Normal cardiovascular exam Rhythm:Regular Rate:Normal     Neuro/Psych negative neurological ROS  negative psych ROS   GI/Hepatic Neg liver ROS,GERD  ,,  Endo/Other  negative endocrine ROS    Renal/GU negative Renal ROS  negative genitourinary   Musculoskeletal negative musculoskeletal ROS (+)    Abdominal   Peds negative pediatric ROS (+)  Hematology negative hematology ROS (+)   Anesthesia Other Findings   Reproductive/Obstetrics negative OB ROS                              Anesthesia Physical Anesthesia Plan  ASA: 3  Anesthesia Plan: General   Post-op Pain Management: Minimal or no pain anticipated   Induction: Intravenous  PONV Risk Score and Plan: 2 and Ondansetron, Dexamethasone and Treatment may vary due to age or medical condition  Airway Management Planned: LMA  Additional Equipment:   Intra-op Plan:   Post-operative Plan: Extubation in OR  Informed Consent: I have reviewed the patients History and Physical, chart, labs and discussed the procedure including the risks, benefits and alternatives for the proposed anesthesia with the patient or authorized representative who has indicated his/her understanding and acceptance.     Dental advisory given  Plan Discussed with: CRNA and Surgeon  Anesthesia Plan Comments:         Anesthesia Quick Evaluation

## 2022-06-15 NOTE — Discharge Instructions (Signed)

## 2022-06-15 NOTE — Transfer of Care (Signed)
Immediate Anesthesia Transfer of Care Note  Patient: Dean Guzman  Procedure(s) Performed: CYSTOSCOPY LEFT URETEROSCOPY/HOLMIUM LASER/STENT PLACEMENT (Left: Ureter)  Patient Location: PACU  Anesthesia Type:General  Level of Consciousness: awake, oriented, and patient cooperative  Airway & Oxygen Therapy: Patient Spontanous Breathing and Patient connected to face mask oxygen  Post-op Assessment: Report given to RN and Post -op Vital signs reviewed and stable  Post vital signs: Reviewed  Last Vitals:  Vitals Value Taken Time  BP 132/72 06/15/22 1345  Temp    Pulse 70 06/15/22 1349  Resp 19 06/15/22 1349  SpO2 98 % 06/15/22 1349  Vitals shown include unvalidated device data.  Last Pain:  Vitals:   06/15/22 1151  TempSrc:   PainSc: 0-No pain         Complications: No notable events documented.

## 2022-06-15 NOTE — Anesthesia Postprocedure Evaluation (Signed)
Anesthesia Post Note  Patient: Ryaan Vanwagoner  Procedure(s) Performed: CYSTOSCOPY LEFT URETEROSCOPY/HOLMIUM LASER/STENT PLACEMENT (Left: Ureter)     Patient location during evaluation: PACU Anesthesia Type: General Level of consciousness: awake and alert Pain management: pain level controlled Vital Signs Assessment: post-procedure vital signs reviewed and stable Respiratory status: spontaneous breathing, nonlabored ventilation, respiratory function stable and patient connected to nasal cannula oxygen Cardiovascular status: blood pressure returned to baseline and stable Postop Assessment: no apparent nausea or vomiting Anesthetic complications: no  No notable events documented.  Last Vitals:  Vitals:   06/15/22 1116 06/15/22 1345  BP: 131/74 132/72  Pulse: 68 63  Resp: 18 18  Temp: 36.9 C 36.6 C  SpO2: 95% 98%    Last Pain:  Vitals:   06/15/22 1345  TempSrc:   PainSc: 0-No pain                 Owynn Mosqueda S

## 2022-06-15 NOTE — H&P (Signed)
CC/HPI: HX:   Cc: Prostate cancer  HPI:  02/04/2020: Here today for f/u after undergoing radioactive seed placement as well as SpaceOAR insertion on 08/16.  He has done well in regards to recovering from the procedure. No longer having any burning or painful urination, visible blood in the urine. He has had an expected increased irritative voiding symptoms specifically nocturia getting upwards of 4-5 times per night. During the day he is not significantly bothered by frequency/urgency. Denies incontinence. No changes in ability to start or stop his stream. Tamsulosin was prescribed late last week in an effort to help better manage his increased irritative voiding symptoms. He has not seen an appreciable difference with the medication but fortunately tolerating it well since starting it. He has been taking it in the morning time. Patient denies postprocedure nausea/vomiting, fever/chills. He is tolerating a normal diet without difficulty. He denies constipation, diarrhea, blood in the stool.   05/07/2020: He returns today for follow-up exam. PSA has now decreased to 0.91. The patient had on warranted side effects with tamsulosin and then with alfuzosin so both alpha-blocker therapies have been discontinued. He does continue Cialis 5 mg once daily. He denies any recurrence of burning/painful urination, visible blood in the urine or stool since time of last office visit. He is having normal daily bowel movements. He is tolerating a normal diet without any nausea/vomiting or indigestion. Energy levels remain stable and he has resumed normal physical activity without any noted complication. Currently not having any significant obstructive voiding symptoms but continues to be bothered by nocturia 4-5 times nightly which is increased and remained at current frequency since his radioactive seed implant. He endorses practicing fluid modification limiting water intake or other beverages at least 2 hours prior to  bedtime.   He previously was having good response with the daily Cialis does for management of occasional erectile dysfunction. This has since regressed since his prostate cancer treatment. Now currently having increased difficulty achieving/maintaining with a moderate tumescence note on often lines unsatisfactory for penetrating sexual activity. His libido remains preserved.   08/05/2020  Most recent PSA is 0.32. Most bothersome complaint is nocturia 2-3 times a night with nighttime incomplete emptying. He continues on daily Cialis and also takes low-dose sildenafil on days of intercourse. He does not take full dose of either 1.   02/04/2021: 6 month follow-up today. PSA now 0.18. Over the past 6 months, his voiding symptoms have somewhat improved. Now more consistently only getting up 1-2 times per night. His daytime urgency has slightly improved as well. He continues to have sensation of incomplete bladder emptying but that seems better as well. Denies any dysuria, gross hematuria. He has been having some increased loose/watery stools but is being closely evaluated and monitored by GI in regards to this. Continues to take daily tadalafil. Also utilizing low dose on demand sildenafil which works well for him.   08/05/2021  Most recent PSA 0.11 on 07/29/2021. Overall doing well with erections on daily tadalafil with breakthrough sildenafil. He is however having some urinary frequency and urgency. Also some nocturia. He would like to try something for this.   10/29/2021  Over the past 4 to 5 weeks, the patient has had a new onset of dysuria with every void. He has also had an increase in frequency and urgency and a little bit weaker stream. He has nocturia 4-5 times at night. He also had a stent placed about 1 month ago. He was started on aspirin and  Plavix as well as a statin. He is not taking nitrates. He no longer takes daily tadalafil. He takes sildenafil as needed for erectile dysfunction. He has  moderate success with this. He has taken Flomax in the past and did not tolerate that well. Daily tadalafil did not really change his voiding complaints. He was prescribed Detrol at the last visit and this gave him heartburn so he stopped it. Postvoid residual is acceptable at 39 cc.   12/16/2021  History as above. Continues to have dysuria, significant frequency, urgency, nocturia. He has failed daily tadalafil, Flomax, alfuzosin, Detrol. Bactrim did not provide any relief from the dysuria.   02/09/2022: Back today for follow-up exam. PSA now 0.1. When last seen by Dr. Gloriann Loan he was referred to hyperbaric oxygen therapy for management of his continued irritative symptoms which included persistent dysuria and gross hematuria. Patient's cost for treatment protocol was going to be close to $10,000 even with insurance coverage, he did not enter the protocol. Since last visit he was able to start [facility] and tolerating it well. Over the past couple of months he has seen significant down-regulation of his frequency/urgency. He has had no interval recurrence of gross hematuria and dysuria is less. IPSS today has decreased from 22-11. UA is clear.   I saw him back in August for injection teaching with prostaglandin. He is utilizing 0.2 mL. This is working quite well for him. He has had no issues with performing the injections including absence of injection site pain or discomfort. He has had no bruising or bleeding, no prolonged or painful erections.   06/13/2022  Patient underwent left ESWL on 1/12. He continues to have persistent severe pain. He has not had any stone passage. KUB shows no change in stone. He denies any fever. Is able to control the pain moderately with Toradol. Percocet is not very helpful.     ALLERGIES: Tamsulosin - Dizziness    MEDICATIONS: Aspirin  Gemtesa 75 mg tablet 1 tablet PO Daily  Gemtesa 75 mg tablet TAKE ONE TABLET BY MOUTH DAILY  Ketorolac Tromethamine 10 mg tablet 1 tablet  PO Q 8 H PRN  Lisinopril 10 mg tablet  Percocet  Tamsulosin Hcl 0.4 mg capsule  Amlodipine Besylate 5 mg tablet  Diltiazem 24Hr Er (Cd) 240 mg capsule, ext release 24 hr  Fish Oil  Ondansetron Hcl  Pantoprazole Sodium 40 mg tablet, delayed release  Rosuvastatin Calcium 5 mg tablet  Sildenafil Citrate 20 mg tablet Take 1-5 tablets PO daily PRN 30-60 minutes before planned activity.  Zetia 10 mg tablet     GU PSH: Cystoscopy - 12/16/2021 ESWL - 06/10/2022 Penile Injection - 01/05/2022 Prostate Needle Biopsy - 2021, 2019 TRANSPERI NEEDLE PLACE, PROS - 01/13/2020 Transperineal Plmt Biodegradable Matrl 1/Mlt Njx - 01/13/2020 Vasectomy       PSH Notes: Vasectomy reversal  Wisdom teeth    lt hand 3rd finger   NON-GU PSH: Cardiac Stent Placement Eye Surgery Procedure Surgical Pathology, Gross And Microscopic Examination For Prostate Needle - 2021, 2019     GU PMH: Ureteral calculus - 06/08/2022 Dysuria - 02/09/2022, - 12/16/2021, - 10/29/2021 ED due to arterial insufficiency - 02/09/2022, - 01/05/2022, - 12/16/2021, - 08/05/2021, - 02/04/2021, - 08/05/2020, - 05/07/2020 Prostate Cancer - 02/09/2022, - 12/16/2021, - 10/29/2021, - 08/05/2021, - 02/04/2021, - 08/05/2020, - 05/07/2020, - 02/04/2020, - 2021 Urinary Frequency - 02/09/2022, - 12/16/2021 (Stable), - 10/29/2021, - 08/05/2021 Urinary Urgency - 02/09/2022, - 12/16/2021 (Stable), - 10/29/2021 (Stable), - 08/05/2021, -  2019 Nocturia - 12/16/2021, - 08/05/2021, - 02/04/2021, - 08/05/2020, - 05/07/2020, - 2021 Weak Urinary Stream - 12/16/2021, - 10/29/2021 BPH w/LUTS - 08/05/2021, - 2019 Elevated PSA - 2019, - 2019 BPH w/o LUTS - 2019 Renal calculus      PMH Notes: Hiatal hernia  Barrett's esophagus   NON-GU PMH: GERD Hypercholesterolemia Hypertension    FAMILY HISTORY: 2 daughters - Other 1 son - Other Arthritis - Father Breast Cancer - Sister Heart Disease - Mother   SOCIAL HISTORY: Marital Status: Married Preferred Language: English; Race: White Current Smoking  Status: Patient does not smoke anymore. Has not smoked since 11/28/1979.   Tobacco Use Assessment Completed: Used Tobacco in last 30 days? Does not use smokeless tobacco. Does drink.  Does not use drugs. Drinks 2 caffeinated drinks per day. Has not had a blood transfusion.     Notes: ETOH 1 drink per week    REVIEW OF SYSTEMS:    GU Review Male:   Patient reports hard to postpone urination and get up at night to urinate. Patient denies frequent urination, burning/ pain with urination, leakage of urine, stream starts and stops, trouble starting your stream, have to strain to urinate , erection problems, and penile pain.  Gastrointestinal (Upper):   Patient reports nausea. Patient denies indigestion/ heartburn and vomiting.  Gastrointestinal (Lower):   Patient reports constipation. Patient denies diarrhea.  Constitutional:   Patient reports weight loss. Patient denies fever, night sweats, and fatigue.  Skin:   Patient denies skin rash/ lesion and itching.  Eyes:   Patient denies blurred vision and double vision.  Ears/ Nose/ Throat:   Patient denies sore throat and sinus problems.  Hematologic/Lymphatic:   Patient denies swollen glands and easy bruising.  Cardiovascular:   Patient denies leg swelling and chest pains.  Respiratory:   Patient denies cough and shortness of breath.  Endocrine:   Patient denies excessive thirst.  Musculoskeletal:   Patient reports back pain. Patient denies joint pain.  Neurological:   Patient denies headaches and dizziness.  Psychologic:   Patient denies depression and anxiety.   VITAL SIGNS: None   MULTI-SYSTEM PHYSICAL EXAMINATION:    Constitutional: Well-nourished. No physical deformities. Normally developed. Good grooming.  Gastrointestinal: No mass, no tenderness, no rigidity, non obese abdomen.     Complexity of Data:  Source Of History:  Patient  Records Review:   Previous Doctor Records, Previous Patient Records  Urine Test Review:   Urinalysis   X-Ray Review: KUB: Reviewed Films. Discussed With Patient.  C.T. Abdomen/Pelvis: Reviewed Films. Reviewed Report. Discussed With Patient.     02/02/22 07/29/21 01/26/21 07/29/20 04/30/20 08/08/19 08/02/18  PSA  Total PSA 0.10 ng/mL 0.11 ng/mL 0.18 ng/mL 0.32 ng/mL 0.91 ng/mL 6.09 ng/mL 4.69 ng/mL    PROCEDURES:         KUB - 74018  A single view of the abdomen is obtained.  Unchanged distal left ureteral calculus      Patient confirmed No Neulasta OnPro Device.           Urinalysis w/Scope Dipstick Dipstick Cont'd Micro  Color: Yellow Bilirubin: Neg mg/dL WBC/hpf: 0 - 5/hpf  Appearance: Clear Ketones: Trace mg/dL RBC/hpf: NS (Not Seen)  Specific Gravity: 1.020 Blood: Trace ery/uL Bacteria: Mod (26-50/hpf)  pH: 5.5 Protein: Trace mg/dL Cystals: NS (Not Seen)  Glucose: Neg mg/dL Urobilinogen: 0.2 mg/dL Casts: Mixed casts    Nitrites: Neg Trichomonas: Not Present    Leukocyte Esterase: Neg leu/uL Mucous: Present  Epithelial Cells: 0 - 5/hpf      Yeast: NS (Not Seen)      Sperm: Not Present    Notes: Hyaline and granular cast present Mostly transitional cells present    ASSESSMENT:      ICD-10 Details  1 GU:   Ureteral calculus - N20.1 Chronic, Stable  2   Ureteral obstruction secondary to calculous - N13.2 Chronic, Stable  3   Renal colic - I14 Chronic, Stable   PLAN:           Orders Labs Urine Culture          Document Letter(s):  Created for Patient: Clinical Summary         Notes:   We discussed the management of urinary stones. These options include observation, ureteroscopy, and shockwave lithotripsy. We discussed which options are relevant to these particular stones. We discussed the natural history of stones as well as the complications of untreated stones and the impact on quality of life without treatment as well as with each of the above listed treatments. We also discussed the efficacy of each treatment in its ability to clear the stone burden. With  any of these management options I discussed the signs and symptoms of infection and the need for emergent treatment should these be experienced. For each option we discussed the ability of each procedure to clear the patient of their stone burden.   For observation I described the risks which include but are not limited to silent renal damage, life-threatening infection, need for emergent surgery, failure to pass stone, and pain.   For ureteroscopy I described the risks which include heart attack, stroke, pulmonary embolus, death, bleeding, infection, damage to contiguous structures, positioning injury, ureteral stricture, ureteral avulsion, ureteral injury, need for ureteral stent, inability to perform ureteroscopy, need for an interval procedure, inability to clear stone burden, stent discomfort and pain.   For shockwave lithotripsy I described the risks which include arrhythmia, kidney contusion, kidney hemorrhage, need for transfusion, pain, inability to break up stone, inability to pass stone fragments, Steinstrasse, infection associated with obstructing stones, need for different surgical procedure, need for repeat shockwave lithotripsy.   He failed initial ESWL. He would like to proceed with ureteroscopy.          Next Appointment:      Next Appointment: 06/15/2022 12:30 PM    Appointment Type: Surgery     Location: [facility] [id number]    Provider: Link Snuffer, III, M.D.    Reason for Visit: OP WL LT URS LL STENT     Signed by Link Snuffer, III, M.D. on 06/13/22 at 12:38 PM (EST

## 2022-06-15 NOTE — Op Note (Signed)
Operative Note  Preoperative diagnosis:  1.  Left ureteral calculus  Postoperative diagnosis: 1.  Left ureteral calculus  Procedure(s): 1.  Cystoscopy with left retrograde pyelogram, left ureteroscopy with laser lithotripsy with stone basketing, ureteral stent placement  Surgeon: Link Snuffer, MD  Assistants: None  Anesthesia: General  Complications: None immediate  EBL: Minimal  Specimens: 1.  Ureteral calculus  Drains/Catheters: 1.  6 x 26 double-J ureteral stent  Intraoperative findings: 1.  Normal anterior urethra 2.  Nonobstructing prostate 3.  Bladder mucosa without any tumors or masses. 4.  Approximately 8 mm calculus fragmented smaller fragments and basket extracted.  Retrograde pyelogram revealed some mild hydronephrosis.  No filling defect after treatment of stone.  Indication: 72 year old male with a distal left ureteral calculus who failed ESWL presents for the previously mentioned operation.  Description of procedure:  The patient was identified and consent was obtained.  The patient was taken to the operating room and placed in the supine position.  The patient was placed under general anesthesia.  Perioperative antibiotics were administered.  The patient was placed in dorsal lithotomy.  Patient was prepped and draped in a standard sterile fashion and a timeout was performed.  A 21 French rigid cystoscope was advanced into the urethra and into the bladder.  Complete cystoscopy was performed with findings noted above.  The left ureter was cannulated with a sensor wire which was advanced up to the kidney under fluoroscopic guidance.  Semirigid ureteroscopy was performed up to the stone which was laser fragmented to smaller fragments followed by basket extraction.  I advanced the scope up to the renal pelvis and all stone was removed.  I shot a retrograde pyelogram through the scope with findings noted above.  I withdrew the scope visualizing the ureter upon removal.   There were no clinically significant stone fragments remaining.  There was some ureteral edema at the distal ureter but no obvious ureteral injury.  I backloaded the wire onto the rigid cystoscope and advanced that into the bladder followed by routine placement of a 6 x 26 double-J ureteral stent.  Fluoroscopy confirmed proximal placement and direct visualization confirmed a good coil within the bladder.  I drained the bladder and withdrew the scope.  Patient tolerated the procedure well was stable postoperative.  Plan: Follow-up in 1 week for stent removal.

## 2022-06-16 ENCOUNTER — Encounter (HOSPITAL_COMMUNITY): Payer: Self-pay | Admitting: Urology

## 2022-06-16 ENCOUNTER — Encounter: Payer: Self-pay | Admitting: Family Medicine

## 2022-06-16 MED ORDER — AMLODIPINE BESYLATE 5 MG PO TABS: 5.0000 mg | ORAL_TABLET | Freq: Every day | ORAL | 0 refills | Status: DC

## 2022-06-16 NOTE — Addendum Note (Signed)
Addended by: Jeronimo Greaves on: 06/16/2022 11:09 AM   Modules accepted: Orders

## 2022-06-22 DIAGNOSIS — N201 Calculus of ureter: Secondary | ICD-10-CM | POA: Diagnosis not present

## 2022-07-01 ENCOUNTER — Ambulatory Visit (INDEPENDENT_AMBULATORY_CARE_PROVIDER_SITE_OTHER): Payer: PPO | Admitting: Family Medicine

## 2022-07-01 ENCOUNTER — Encounter: Payer: Self-pay | Admitting: Family Medicine

## 2022-07-01 VITALS — BP 108/72 | HR 51 | Temp 97.7°F | Ht 74.0 in | Wt 221.4 lb

## 2022-07-01 DIAGNOSIS — R7989 Other specified abnormal findings of blood chemistry: Secondary | ICD-10-CM | POA: Diagnosis not present

## 2022-07-01 DIAGNOSIS — K2270 Barrett's esophagus without dysplasia: Secondary | ICD-10-CM

## 2022-07-01 DIAGNOSIS — I10 Essential (primary) hypertension: Secondary | ICD-10-CM

## 2022-07-01 LAB — BASIC METABOLIC PANEL
BUN: 14 mg/dL (ref 6–23)
CO2: 30 mEq/L (ref 19–32)
Calcium: 9.6 mg/dL (ref 8.4–10.5)
Chloride: 102 mEq/L (ref 96–112)
Creatinine, Ser: 1.14 mg/dL (ref 0.40–1.50)
GFR: 64.57 mL/min (ref >60.00)
Glucose, Bld: 111 mg/dL — ABNORMAL HIGH (ref 70–99)
Potassium: 5.1 mEq/L (ref 3.5–5.1)
Sodium: 138 mEq/L (ref 135–145)

## 2022-07-01 LAB — CBC
HCT: 40.8 % (ref 39.0–52.0)
Hemoglobin: 13.9 g/dL (ref 13.0–17.0)
MCHC: 34.2 g/dL (ref 30.0–36.0)
MCV: 86.6 fl (ref 78.0–100.0)
Platelets: 191 10*3/uL (ref 150.0–400.0)
RBC: 4.71 Mil/uL (ref 4.22–5.81)
RDW: 14.2 % (ref 11.5–15.5)
WBC: 5.5 10*3/uL (ref 4.0–10.5)

## 2022-07-01 NOTE — Patient Instructions (Signed)
Give us 2-3 business days to get the results of your labs back.   Keep the diet clean and stay active.  Let us know if you need anything. 

## 2022-07-01 NOTE — Progress Notes (Signed)
Chief Complaint  Patient presents with   Follow-up    6 month    Subjective Dean Guzman is a 72 y.o. male who presents for hypertension follow up. [name] does monitor home blood pressures. Blood pressures ranging from 120's/70's on average. [name] is compliant with medications- Cardizem 240 mg/d, Norvasc 5 mg/d, lisinopril 10 mg/d. Patient has these side effects of medication: none [name] is usually adhering to a healthy diet overall. Current exercise: walking No Cp or SOB.    Past Medical History:  Diagnosis Date   Barrett's esophagus    BPH (benign prostatic hyperplasia)    Colon polyps    Complication of anesthesia    during biopsy, felt hot and sweating   Coronary artery disease    stent 4 2023   FH: factor V Leiden mutation    pt tested negatiove    niece died of leiden factor five complications, sister has   Frequent headaches    GERD (gastroesophageal reflux disease)    History of hiatal hernia    History of kidney stones    Hyperlipidemia    Hypertension    Migraines     no recent migraines had visual aura   Neuromuscular disorder (HCC)    neuropathy feet   Pneumonia    Prostate cancer (HCC)    Sleep apnea    no current issues no longer CPAP machine    Exam BP 108/72 (BP Location: Left Arm, Patient Position: Sitting, Cuff Size: Normal)   Pulse (!) 51   Temp 97.7 F (36.5 C) (Oral)   Ht '6\' 2"'$  (1.88 m)   Wt 221 lb 6 oz (100.4 kg)   SpO2 99%   BMI 28.42 kg/m  General:  well developed, well nourished, in no apparent distress Heart: RRR, no bruits Lungs: clear to auscultation, no accessory muscle use Psych: well oriented with normal range of affect and appropriate judgment/insight  Essential hypertension  Barrett's esophagus without dysplasia - Plan: CBC  Elevated serum creatinine - Plan: Basic metabolic panel  Chronic, stable. Cont Cardizem 240 mg/d, Norvasc 5 mg/d, lisinopril 10 mg/d. Counseled on diet and exercise. F/u  in 6 mo or prn. The patient voiced understanding and agreement to the plan.  [place], DO 07/01/22  9:48 AM

## 2022-07-18 ENCOUNTER — Encounter: Payer: Self-pay | Admitting: Family Medicine

## 2022-07-18 ENCOUNTER — Ambulatory Visit (INDEPENDENT_AMBULATORY_CARE_PROVIDER_SITE_OTHER): Payer: PPO | Admitting: *Deleted

## 2022-07-18 DIAGNOSIS — Z0000 Encounter for general adult medical examination without abnormal findings: Secondary | ICD-10-CM

## 2022-07-18 MED ORDER — IPRATROPIUM BROMIDE 0.06 % NA SOLN: 2.0000 | Freq: Three times a day (TID) | NASAL | 3 refills | Status: DC | PRN

## 2022-07-18 MED ORDER — DILTIAZEM HCL ER 240 MG PO CP24: 240.0000 mg | ORAL_CAPSULE | Freq: Every day | ORAL | 1 refills | Status: DC

## 2022-07-18 NOTE — Progress Notes (Signed)
Subjective:  Pt completed ADLs, Fall risk, & SDOH during e-check in on 07/11/22.  Answers verified with pt.    Dean Guzman is a 72 y.o. male who presents for Medicare Annual/Subsequent preventive examination.  I connected with  Madlyn Frankel on 07/18/22 by a audio enabled telemedicine application and verified that I am speaking with the correct person using two identifiers.  Patient Location: Home  Provider Location: Office/Clinic  I discussed the limitations of evaluation and management by telemedicine. The patient expressed understanding and agreed to proceed.   Review of Systems    Defer to PCP Cardiac Risk Factors include: advanced age (>54mn, >>72women);male gender;dyslipidemia;hypertension     Objective:    There were no vitals filed for this visit. There is no height or weight on file to calculate BMI.     07/18/2022   10:46 AM 06/15/2022   11:45 AM 06/13/2022    2:15 PM 06/10/2022    6:45 AM 06/05/2022   10:05 PM 05/19/2022    6:08 AM 05/06/2022   10:03 AM  Advanced Directives  Does Patient Have a Medical Advance Directive? Yes Yes Yes Yes Yes Yes Yes  Type of AParamed[place]of [place]Living will [place]Living will HLos Veter[place] ILiving will HHawthorneLiving will [place]Living will [place]un[place]Living will Living will;Healthcare Power of Attorney  Does patient want to make changes to medical advance directive? No - Patient declined  No - Patient declined  No - Patient declined No - Patient declined No - Patient declined  Copy of HHoutzdalein Chart? Yes - validated most recent copy scanned in chart (See row information)    No - copy requested Yes - validated most recent copy scanned in chart (See row information) Yes - validated most recent copy scanned in chart (See row information)  Would patient like information on creating a medical  advance directive?     No - Patient declined      Current Medications (verified) Outpatient Encounter Medications as of 07/18/2022  Medication Sig   amLODipine (NORVASC) 5 MG tablet Take 1 tablet (5 mg total) by mouth daily.   aspirin EC 81 MG tablet Take 81 mg by mouth daily. Swallow whole.   cholecalciferol (VITAMIN D3) 25 MCG (1000 UT) tablet Take 1,000 Units by mouth daily.   diltiazem (DILACOR XR) 240 MG 24 hr capsule Take 1 capsule (240 mg total) by mouth daily.   ezetimibe (ZETIA) 10 MG tablet Take 1 tablet (10 mg total) by mouth daily. (Patient taking differently: Take 10 mg by mouth every evening.)   ibuprofen (ADVIL) 200 MG tablet Take 200 mg by mouth every 6 (six) hours as needed for moderate pain.   ipratropium (ATROVENT) 0.06 % nasal spray Place 2 sprays into both nostrils 3 (three) times daily as needed for rhinitis.   lisinopril (ZESTRIL) 10 MG tablet Take 1 tablet (10 mg total) by mouth daily.   nitroGLYCERIN (NITROSTAT) 0.4 MG SL tablet Place 1 tablet (0.4 mg total) under the tongue every 5 (five) minutes as needed for chest pain. Do not use with tadalafil and sildenafil   Omega-3 Fatty Acids (FISH OIL) 1200 MG CAPS Take 1,200 mg by mouth 2 (two) times daily.   rosuvastatin (CRESTOR) 20 MG tablet Take 1 tablet (20 mg total) by mouth daily. (Patient taking differently: Take 20 mg by mouth every evening.)   Vibegron (GEMTESA) 75 MG TABS Take 75  mg by mouth daily.   Facility-Administered Encounter Medications as of 07/18/2022  Medication   dexamethasone (DECADRON) injection 4 mg    Allergies (verified) Patient has no known allergies.   History: Past Medical History:  Diagnosis Date   Barrett's esophagus    BPH (benign prostatic hyperplasia)    Colon polyps    Complication of anesthesia    during biopsy, felt hot and sweating   Coronary artery disease    stent 4 2023   FH: factor V Leiden mutation    pt tested negatiove    niece died of leiden factor five  complications, sister has   Frequent headaches    GERD (gastroesophageal reflux disease)    History of hiatal hernia    History of kidney stones    Hyperlipidemia    Hypertension    Migraines     no recent migraines had visual aura   Neuromuscular disorder (HCC)    neuropathy feet   Pneumonia    Prostate cancer ([place])    Sleep apnea    no current issues no longer CPAP machine   Past Surgical History:  Procedure Laterality Date   colonscopy     CORONARY ANGIOGRAPHY N/A 09/08/2021   Procedure: CORONARY ANGIOGRAPHY;  Surgeon: Sherren Mocha, MD;  Location: [facility];  Service: Cardiovascular;  Laterality: N/A;   CORONARY STENT INTERVENTION N/A 09/08/2021   Procedure: CORONARY STENT INTERVENTION;  Surgeon: Sherren Mocha, MD;  Location: [place] [facility] [facility];  Service: Cardiovascular;  Laterality: N/A;   CYSTOSCOPY N/A 01/13/2020   Procedure: CYSTOSCOPY FLEXIBLE;  Surgeon: Lucas Mallow, MD;  Location[facility];  Service: Urology;  Laterality: N/A;  NO SEEDS FOUND IN BLADDER   CYSTOSCOPY/URETEROSCOPY/HOLMIUM LASER/STENT PLACEMENT Left 06/15/2022   Procedure: CYSTOSCOPY LEFT URETEROSCOPY/HOLMIUM LASER/STENT PLACEMENT;  Surgeon: Lucas Mallow, MD;  Location: [facility];  Service: Urology;  Laterality: Left;  1 HR FOR CASE   ESOPHAGEAL MANOMETRY N/A 04/28/2022   Procedure: ESOPHAGEAL MANOMETRY (EM);  Surgeon: Wilford Corner, MD;  Location: [facility];  Service: Gastroenterology;  Laterality: N/A;   EXTRACORPOREAL SHOCK WAVE LITHOTRIPSY Left 06/10/2022   Procedure: LEFT EXTRACORPOREAL SHOCK WAVE LITHOTRIPSY (ESWL);  Surgeon: Remi Haggard, MD;  Location: [facility];  Service: Urology;  Laterality: Left;   EYE SURGERY     Lasik   INTRAVASCULAR PRESSURE WIRE/FFR STUDY N/A 09/08/2021   Procedure: INTRAVASCULAR PRESSURE WIRE/FFR STUDY;  Surgeon: Sherren Mocha, MD;  Location: [place] [facility];  Service: Cardiovascular;  Laterality: N/A;    MASS EXCISION Left 02/08/2021   Procedure: EXCISION MASS OF MUCOID CYST WITH DISTAL INTERPHANGEAL JOINT ARTHROTOMY LEFT MIDDLE FINGER;  Surgeon: [name], MD;  Location: [place];  Service: Orthopedics;  Laterality: Left;   NASAL ENDOSCOPY     PROSTATE BIOPSY  may 2021 and 2020   RADIOACTIVE SEED IMPLANT N/A 01/13/2020   Procedure: RADIOACTIVE SEED IMPLANT/BRACHYTHERAPY IMPLANT;  Surgeon: Lucas Mallow, MD;  Location: [place];  Service: Urology;  Laterality: N/A;    73 SEEDS IMPLANTED   SPACE OAR INSTILLATION N/A 01/13/2020   Procedure: SPACE OAR INSTILLATION;  Surgeon: Lucas Mallow, MD;  Location: [facility];  Service: Urology;  Laterality: N/A;   XI ROBOTIC ASSISTED HIATAL HERNIA REPAIR N/A 05/19/2022   Procedure: XI ROBOTIC ASSISTED REPAIR OF PARAESOPHAGEAL HIATAL HERNIA WITH FUNDOPLICATION;  Surgeon: Michael Boston, MD;  Location: [facility];  Service: General;  Laterality: N/A;  Family History  Problem Relation Age of Onset   Arthritis Mother    Heart disease Father    Hyperlipidemia Father    Hypertension Father    Breast cancer Sister    Diabetes Maternal Grandmother    Hearing loss Paternal Grandmother    Early death Paternal Grandmother    Hearing loss Paternal Grandfather    Early death Paternal Grandfather    Allergic rhinitis Neg Hx    Asthma Neg Hx    Eczema Neg Hx    Urticaria Neg Hx    Prostate cancer Neg Hx    Colon cancer Neg Hx    Pancreatic cancer Neg Hx    Social History   Socioeconomic History   Marital status: Married    Spouse name: Not on file   Number of children: 3   Years of education: Not on file   Highest education level: Not on file  Occupational History    Comment: full time  Tobacco Use   Smoking status: Former    Packs/day: 1.00    Years: 12.00    Total pack years: 12.00    Types: Cigarettes    Quit date: 05/30/1978    Years since quitting: 44.1   Smokeless tobacco: Never   Vaping Use   Vaping Use: Never used  Substance and Sexual Activity   Alcohol use: Yes    Comment: rarely   Drug use: Not Currently   Sexual activity: Not Currently  Other Topics Concern   Not on file  Social History Narrative   Not on file   Social Determinants of Health   Financial Resource Strain: Low Risk  (07/11/2022)   Overall Financial Resource Strain (CARDIA)    Difficulty of Paying Living Expenses: Not hard at all  Food Insecurity: No Food Insecurity (07/11/2022)   Hunger Vital Sign    Worried About Running Out of Food in the Last Year: Never true    Ran Out of Food in the Last Year: Never true  Transportation Needs: No Transportation Needs (07/11/2022)   PRAPARE - Hydrologist (Medical): No    Lack of Transportation (Non-Medical): No  Physical Activity: Sufficiently Active (07/11/2022)   Exercise Vital Sign    Days of Exercise per Week: 5 days    Minutes of Exercise per Session: 40 min  Stress: No Stress Concern Present (07/11/2022)   [place]    Feeling of Stress : Not at all  Social Connections: Socially Isolated (07/11/2022)   Social Connection and Isolation Panel [NHANES]    Frequency of Communication with Friends and Family: Once a week    Frequency of Social Gatherings with Friends and Family: Once a week    Attends Religious Services: Never    Marine scientist or Organizations: No    Attends Music therapist: Never    Marital Status: Married    Tobacco Counseling Counseling given: Not Answered   Clinical Intake:  Pre-visit preparation completed: Yes  Pain : No/denies pain  Diabetes: No  How often do you need to have someone help you when you read instructions, pamphlets, or other written materials from your doctor or pharmacy?: 1 - Never   Activities of Daily Living    07/11/2022    8:42 AM 06/13/2022    2:15 PM  In your present  state of health, do you have any difficulty performing the following activities:  Hearing? 0 0  Vision? 0 0  Difficulty concentrating or making decisions? 0 0  Walking or climbing stairs? 0 0  Dressing or bathing? 0 0  Doing errands, shopping? 0   Preparing Food and eating ? N   Using the Toilet? N   In the past six months, have you accidently leaked urine? N   Do you have problems with loss of bowel control? N   Managing your Medications? N   Managing your Finances? N   Housekeeping or managing your Housekeeping? N     Patient Care Team: Shelda Pal, DO as PCP - General (Family Medicine) Michael Boston, MD as Consulting Physician (General Surgery) Wilford Corner, MD as Consulting Physician (Gastroenterology) Park Liter, MD as Consulting Physician (Cardiology)  Indicate any recent Medical Services you may have received from other than [facility] providers in the past year (date may be approximate).     Assessment:   This is a routine wellness examination for [name].  Hearing/Vision screen No results found.  Dietary issues and exercise activities discussed: Current Exercise Habits: Home exercise routine, Type of exercise: walking, Time (Minutes): 40, Frequency (Times/Week): 5, Weekly Exercise (Minutes/Week): 200, Intensity: Moderate, Exercise limited by: None identified   Goals Addressed   None    Depression Screen    07/18/2022   11:04 AM 12/28/2021    9:21 AM 09/14/2021    2:36 PM 06/29/2021   10:23 AM 08/17/2020    2:22 PM  PHQ 2/9 Scores  PHQ - 2 Score 0 0 0 0 0  PHQ- 9 Score  0       Fall Risk    07/11/2022    8:42 AM 07/01/2022    9:48 AM 12/28/2021    9:20 AM 09/14/2021    2:35 PM 06/29/2021   10:23 AM  Fall Risk   Falls in the past year? 0 0 0 0 0  Number falls in past yr: 0 0 0 0 0  Injury with Fall? 0 0 0 0 0  Risk for fall due to : No Fall Risks  No Fall Risks  No Fall Risks  Follow up Falls evaluation completed  Falls evaluation completed   Falls evaluation completed    [place]:  Any stairs in or around the home? Yes  If so, are there any without handrails? No  Home free of loose throw rugs in walkways, pet beds, electrical cords, etc? Yes  Adequate lighting in your home to reduce risk of falls? Yes   ASSISTIVE DEVICES UTILIZED TO PREVENT FALLS:  Life alert? No  Use of a cane, walker or w/c? No  Grab bars in the bathroom? No  Shower chair or bench in shower?  Built in seat Elevated toilet seat or a handicapped toilet? No   TIMED UP AND GO:  Was the test performed?  No, audio visit .    Cognitive Function:        07/18/2022   11:07 AM  6CIT Screen  What Year? 0 points  What month? 0 points  What time? 0 points  Count back from 20 0 points  Months in reverse 0 points  Repeat phrase 0 points  Total Score 0 points    Immunizations Immunization History  Administered Date(s) Administered   COVID-19, mRNA, vaccine(Comirnaty)12 years and older 02/12/2022   Fluad Quad(high Dose 65+) 03/01/2020   Influenza, High Dose Seasonal PF 02/15/2021, 03/21/2022   Influenza-Unspecified 02/28/2019   PFIZER(Purple Top)SARS-COV-2 Vaccination 06/18/2019, 07/09/2019, 01/16/2020,  02/15/2021   PNEUMOCOCCAL CONJUGATE-20 06/29/2021   Pneumococcal Polysaccharide-23 12/11/2018   Zoster Recombinat (Shingrix) 08/29/2018, 10/29/2018    TDAP status: Due, Education has been provided regarding the importance of this vaccine. Advised may receive this vaccine at local pharmacy or Health Dept. Aware to provide a copy of the vaccination record if obtained from local pharmacy or Health Dept. Verbalized acceptance and understanding.  Flu Vaccine status: Up to date  Pneumococcal vaccine status: Up to date  Covid-19 vaccine status: Declined, Education has been provided regarding the importance of this vaccine but patient still declined. Advised may receive this vaccine at local pharmacy or Health Dept.or  vaccine clinic. Aware to provide a copy of the vaccination record if obtained from local pharmacy or Health Dept. Verbalized acceptance and understanding.  Qualifies for Shingles Vaccine? Yes   Zostavax completed No   Shingrix Completed?: Yes  Screening Tests Health Maintenance  Topic Date Due   DTaP/Tdap/Td (1 - Tdap) Never done   Medicare Annual Wellness (AWV)  06/29/2022   COLONOSCOPY (Pts 45-1yr Insurance coverage will need to be confirmed)  09/09/2029   Pneumonia Vaccine 72 Years old  Completed   INFLUENZA VACCINE  Completed   Hepatitis C Screening  Completed   Zoster Vaccines- Shingrix  Completed   HPV VACCINES  Aged Out   COVID-19 Vaccine  Discontinued    Health Maintenance  Health Maintenance Due  Topic Date Due   DTaP/Tdap/Td (1 - Tdap) Never done   Medicare Annual Wellness (AWV)  06/29/2022    Colorectal cancer screening: Type of screening: Colonoscopy. Completed 09/10/19. Repeat every 10 years  Lung Cancer Screening: (Low Dose CT Chest recommended if Age 72-80years, 30 pack-year currently smoking OR have quit w/in 15years.) does not qualify.   Additional Screening:  Hepatitis C Screening: does qualify; Completed 11/06/17  Vision Screening: Recommended annual ophthalmology exams for early detection of glaucoma and other disorders of the eye. Is the patient up to date with their annual eye exam?  Yes  Who is the provider or what is the name of the office in which the patient attends annual eye exams? [facility]. If pt is not established with a provider, would they like to be referred to a provider to establish care? No .   Dental Screening: Recommended annual dental exams for proper oral hygiene  Community Resource Referral / Chronic Care Management: CRR required this visit?  No   CCM required this visit?  No      Plan:     I have personally reviewed and noted the following in the patient's chart:   Medical and social history Use of alcohol,  tobacco or illicit drugs  Current medications and supplements including opioid prescriptions. Patient is not currently taking opioid prescriptions. Functional ability and status Nutritional status Physical activity Advanced directives List of other physicians Hospitalizations, surgeries, and ER visits in previous 12 months Vitals Screenings to include cognitive, depression, and falls Referrals and appointments  In addition, I have reviewed and discussed with patient certain preventive protocols, quality metrics, and best practice recommendations. A written personalized care plan for preventive services as well as general preventive health recommendations were provided to patient.   Due to this being a telephonic visit, the after visit summary with patients personalized plan was offered to patient via mail or my-chart. Patient would like to access on my-chart.   BBeatris Ship [place]  07/18/2022   Nurse Notes: None

## 2022-07-18 NOTE — Patient Instructions (Signed)
Dean Guzman , Thank you for taking time to come for your Medicare Wellness Visit. I appreciate your ongoing commitment to your health goals. Please review the following plan we discussed and let me know if I can assist you in the future.   These are the goals we discussed:  Goals   None     This is a list of the screening recommended for you and due dates:  Health Maintenance  Topic Date Due   DTaP/Tdap/Td vaccine (1 - Tdap) Never done   Medicare Annual Wellness Visit  07/19/2023   Colon Cancer Screening  09/09/2029   Pneumonia Vaccine  Completed   Flu Shot  Completed   Hepatitis C Screening: USPSTF Recommendation to screen - Ages 18-79 yo.  Completed   Zoster (Shingles) Vaccine  Completed   HPV Vaccine  Aged Out   COVID-19 Vaccine  Discontinued     Next appointment: Follow up in one year for your annual wellness visit.   Preventive Care 50 Years and Older, Male Preventive care refers to lifestyle choices and visits with your health care provider that can promote health and wellness. What does preventive care include? A yearly physical exam. This is also called an annual well check. Dental exams once or twice a year. Routine eye exams. Ask your health care provider how often you should have your eyes checked. Personal lifestyle choices, including: Daily care of your teeth and gums. Regular physical activity. Eating a healthy diet. Avoiding tobacco and drug use. Limiting alcohol use. Practicing safe sex. Taking low doses of aspirin every day. Taking vitamin and mineral supplements as recommended by your health care provider. What happens during an annual well check? The services and screenings done by your health care provider during your annual well check will depend on your age, overall health, lifestyle risk factors, and family history of disease. Counseling  Your health care provider may ask you questions about your: Alcohol use. Tobacco use. Drug use. Emotional  well-being. Home and relationship well-being. Sexual activity. Eating habits. History of falls. Memory and ability to understand (cognition). Work and work Statistician. Screening  You may have the following tests or measurements: Height, weight, and BMI. Blood pressure. Lipid and cholesterol levels. These may be checked every 5 years, or more frequently if you are over 7 years old. Skin check. Lung cancer screening. You may have this screening every year starting at age 17 if you have a 30-pack-year history of smoking and currently smoke or have quit within the past 15 years. Fecal occult blood test (FOBT) of the stool. You may have this test every year starting at age 65. Flexible sigmoidoscopy or colonoscopy. You may have a sigmoidoscopy every 5 years or a colonoscopy every 10 years starting at age 80. Prostate cancer screening. Recommendations will vary depending on your family history and other risks. Hepatitis C blood test. Hepatitis B blood test. Sexually transmitted disease (STD) testing. Diabetes screening. This is done by checking your blood sugar (glucose) after you have not eaten for a while (fasting). You may have this done every 1-3 years. Abdominal aortic aneurysm (AAA) screening. You may need this if you are a current or former smoker. Osteoporosis. You may be screened starting at age 65 if you are at high risk. Talk with your health care provider about your test results, treatment options, and if necessary, the need for more tests. Vaccines  Your health care provider may recommend certain vaccines, such as: Influenza vaccine. This is recommended  every year. Tetanus, diphtheria, and acellular pertussis (Tdap, Td) vaccine. You may need a Td booster every 10 years. Zoster vaccine. You may need this after age 70. Pneumococcal 13-valent conjugate (PCV13) vaccine. One dose is recommended after age 81. Pneumococcal polysaccharide (PPSV23) vaccine. One dose is recommended after  age 24. Talk to your health care provider about which screenings and vaccines you need and how often you need them. This information is not intended to replace advice given to you by your health care provider. Make sure you discuss any questions you have with your health care provider. Document Released: 06/12/2015 Document Revised: 02/03/2016 Document Reviewed: 03/17/2015 Elsevier Interactive Patient Education  2017 [place] Prevention in the Home Falls can cause injuries. They can happen to people of all ages. There are many things you can do to make your home safe and to help prevent falls. What can I do on the outside of my home? Regularly fix the edges of walkways and driveways and fix any cracks. Remove anything that might make you trip as you walk through a door, such as a raised step or threshold. Trim any bushes or trees on the path to your home. Use bright outdoor lighting. Clear any walking paths of anything that might make someone trip, such as rocks or tools. Regularly check to see if handrails are loose or broken. Make sure that both sides of any steps have handrails. Any raised decks and porches should have guardrails on the edges. Have any leaves, snow, or ice cleared regularly. Use sand or salt on walking paths during winter. Clean up any spills in your garage right away. This includes oil or grease spills. What can I do in the bathroom? Use night lights. Install grab bars by the toilet and in the tub and shower. Do not use towel bars as grab bars. Use non-skid mats or decals in the tub or shower. If you need to sit down in the shower, use a plastic, non-slip stool. Keep the floor dry. Clean up any water that spills on the floor as soon as it happens. Remove soap buildup in the tub or shower regularly. Attach bath mats securely with double-sided non-slip rug tape. Do not have throw rugs and other things on the floor that can make you trip. What can I do in the  bedroom? Use night lights. Make sure that you have a light by your bed that is easy to reach. Do not use any sheets or blankets that are too big for your bed. They should not hang down onto the floor. Have a firm chair that has side arms. You can use this for support while you get dressed. Do not have throw rugs and other things on the floor that can make you trip. What can I do in the kitchen? Clean up any spills right away. Avoid walking on wet floors. Keep items that you use a lot in easy-to-reach places. If you need to reach something above you, use a strong step stool that has a grab bar. Keep electrical cords out of the way. Do not use floor polish or wax that makes floors slippery. If you must use wax, use non-skid floor wax. Do not have throw rugs and other things on the floor that can make you trip. What can I do with my stairs? Do not leave any items on the stairs. Make sure that there are handrails on both sides of the stairs and use them. Fix handrails that are broken  or loose. Make sure that handrails are as long as the stairways. Check any carpeting to make sure that it is firmly attached to the stairs. Fix any carpet that is loose or worn. Avoid having throw rugs at the top or bottom of the stairs. If you do have throw rugs, attach them to the floor with carpet tape. Make sure that you have a light switch at the top of the stairs and the bottom of the stairs. If you do not have them, ask someone to add them for you. What else can I do to help prevent falls? Wear shoes that: Do not have high heels. Have rubber bottoms. Are comfortable and fit you well. Are closed at the toe. Do not wear sandals. If you use a stepladder: Make sure that it is fully opened. Do not climb a closed stepladder. Make sure that both sides of the stepladder are locked into place. Ask someone to hold it for you, if possible. Clearly mark and make sure that you can see: Any grab bars or  handrails. First and last steps. Where the edge of each step is. Use tools that help you move around (mobility aids) if they are needed. These include: Canes. Walkers. Scooters. Crutches. Turn on the lights when you go into a dark area. Replace any light bulbs as soon as they burn out. Set up your furniture so you have a clear path. Avoid moving your furniture around. If any of your floors are uneven, fix them. If there are any pets around you, be aware of where they are. Review your medicines with your doctor. Some medicines can make you feel dizzy. This can increase your chance of falling. Ask your doctor what other things that you can do to help prevent falls. This information is not intended to replace advice given to you by your health care provider. Make sure you discuss any questions you have with your health care provider. Document Released: 03/12/2009 Document Revised: 10/22/2015 Document Reviewed: 06/20/2014 Elsevier Interactive Patient Education  2017 [employer].

## 2022-07-26 DIAGNOSIS — C61 Malignant neoplasm of prostate: Secondary | ICD-10-CM | POA: Diagnosis not present

## 2022-08-01 DIAGNOSIS — E782 Mixed hyperlipidemia: Secondary | ICD-10-CM | POA: Diagnosis not present

## 2022-08-02 DIAGNOSIS — N201 Calculus of ureter: Secondary | ICD-10-CM | POA: Diagnosis not present

## 2022-08-02 LAB — LIPID PANEL
Chol/HDL Ratio: 2.5 ratio (ref 0.0–5.0)
Cholesterol, Total: 112 mg/dL (ref 100–199)
HDL: 45 mg/dL (ref >39)
LDL Chol Calc (NIH): 50 mg/dL (ref 0–99)
Triglycerides: 85 mg/dL (ref 0–149)
VLDL Cholesterol Cal: 17 mg/dL (ref 5–40)

## 2022-08-02 LAB — AST: AST: 16 IU/L (ref 0–40)

## 2022-08-02 LAB — ALT: ALT: 16 IU/L (ref 0–44)

## 2022-08-09 ENCOUNTER — Encounter: Payer: Self-pay | Admitting: Cardiology

## 2022-08-09 ENCOUNTER — Ambulatory Visit: Payer: PPO | Attending: Cardiology | Admitting: Cardiology

## 2022-08-09 ENCOUNTER — Ambulatory Visit: Payer: PPO | Attending: Cardiology

## 2022-08-09 VITALS — BP 134/64 | HR 56 | Ht 74.0 in | Wt 208.0 lb

## 2022-08-09 DIAGNOSIS — I10 Essential (primary) hypertension: Secondary | ICD-10-CM | POA: Diagnosis not present

## 2022-08-09 DIAGNOSIS — K219 Gastro-esophageal reflux disease without esophagitis: Secondary | ICD-10-CM

## 2022-08-09 DIAGNOSIS — I2510 Atherosclerotic heart disease of native coronary artery without angina pectoris: Secondary | ICD-10-CM | POA: Diagnosis not present

## 2022-08-09 DIAGNOSIS — R55 Syncope and collapse: Secondary | ICD-10-CM | POA: Diagnosis not present

## 2022-08-09 DIAGNOSIS — I4729 Other ventricular tachycardia: Secondary | ICD-10-CM

## 2022-08-09 DIAGNOSIS — R0609 Other forms of dyspnea: Secondary | ICD-10-CM

## 2022-08-09 NOTE — Patient Instructions (Signed)
Medication Instructions:  Your physician recommends that you continue on your current medications as directed. Please refer to the Current Medication list given to you today.  *If you need a refill on your cardiac medications before your next appointment, please call your pharmacy*   Lab Work: None Ordered If you have labs (blood work) drawn today and your tests are completely normal, you will receive your results only by: [place] (if you have MyChart) OR A paper copy in the mail If you have any lab test that is abnormal or we need to change your treatment, we will call you to review the results.   Testing/Procedures: Your physician has requested that you have an echocardiogram. Echocardiography is a painless test that uses sound waves to create images of your heart. It provides your doctor with information about the size and shape of your heart and how well your heart's chambers and valves are working. This procedure takes approximately one hour. There are no restrictions for this procedure. Please do NOT wear cologne, perfume, aftershave, or lotions (deodorant is allowed). Please arrive 15 minutes prior to your appointment time.   WHY IS MY DOCTOR PRESCRIBING ZIO? The Zio system is proven and trusted by physicians to detect and diagnose irregular heart rhythms -- and has been prescribed to hundreds of thousands of patients.  The FDA has cleared the Zio system to monitor for many different kinds of irregular heart rhythms. In a study, physicians were able to reach a diagnosis 90% of the time with the Zio system1.  You can wear the Zio monitor -- a small, discreet, comfortable patch -- during your normal day-to-day activity, including while you sleep, shower, and exercise, while it records every single heartbeat for analysis.  1Barrett, P., et al. Comparison of 24 Hour Holter Monitoring Versus 14 Day Novel Adhesive Patch Electrocardiographic Monitoring. [place],  2014.  ZIO VS. HOLTER MONITORING The Zio monitor can be comfortably worn for up to 14 days. Holter monitors can be worn for 24 to 48 hours, limiting the time to record any irregular heart rhythms you may have. Zio is able to capture data for the 51% of patients who have their first symptom-triggered arrhythmia after 48 hours.1  LIVE WITHOUT RESTRICTIONS The Zio ambulatory cardiac monitor is a small, unobtrusive, and water-resistant patch--you might even forget you're wearing it. The Zio monitor records and stores every beat of your heart, whether you're sleeping, working out, or showering.      Follow-Up: At [facility], you and your health needs are our priority.  As part of our continuing mission to provide you with exceptional heart care, we have created designated Provider Care Teams.  These Care Teams include your primary Cardiologist (physician) and Advanced Practice Providers (APPs -  Physician Assistants and Nurse Practitioners) who all work together to provide you with the care you need, when you need it.  We recommend signing up for the patient portal called "MyChart".  Sign up information is provided on this After Visit Summary.  MyChart is used to connect with patients for Virtual Visits (Telemedicine).  Patients are able to view lab/test results, encounter notes, upcoming appointments, etc.  Non-urgent messages can be sent to your provider as well.   To learn more about what you can do with MyChart, go to [email or web address].    Your next appointment:   6 month(s)  The format for your next appointment:   In Person  Provider:   Jenne Campus, MD  Other Instructions NA

## 2022-08-09 NOTE — Progress Notes (Signed)
Cardiology Office Note:    Date:  08/09/2022   ID:  Dean Guzman, DOB [birth year], MRN [id number]  PCP:  Shelda Pal, DO  Cardiologist:  Jenne Campus, MD    Referring MD: Shelda Pal*   No chief complaint on file. Well cardiac wise  History of Present Illness:    Dean Guzman is a 72 y.o. male   with past medical history significant for factor V Leiden mutation, dyslipidemia, essential hypertension, initially he was referred to [place] because of episode of dizziness and near syncope, monitor was placed showed some ventricular tachycardia after that echocardiogram performed showing preserved ejection fraction, as a part of stratification of this arrhythmia he had stress test done which surprisingly showed old myocardial infarction with peri-infarct ischemia involving inferior wall.  After the cardiac catheterization was done and was find to have mid LAD lesion which was hemodynamically significant that was addressed with drug-eluting stent.  Comes today to my office for follow-up overall he seems to be doing quite well still very active walks a lot at home he describes however situation that out of blue he can be profoundly exhausted for example he described situation when he was installing some security camera and suddenly became very weak tired exhausted and had to stop otherwise he was afraid he will passed out.  Did not have any chest pain tightness squeezing pressure burning chest during that time.  Otherwise he have no problem.  Past Medical History:  Diagnosis Date   Barrett's esophagus    BPH (benign prostatic hyperplasia)    Colon polyps    Complication of anesthesia    during biopsy, felt hot and sweating   Coronary artery disease    stent 4 2023   FH: factor V Leiden mutation    pt tested negatiove    niece died of leiden factor five complications, sister has   Frequent headaches    GERD (gastroesophageal reflux disease)    History of hiatal hernia     History of kidney stones    Hyperlipidemia    Hypertension    Migraines     no recent migraines had visual aura   Neuromuscular disorder (HCC)    neuropathy feet   Pneumonia    Prostate cancer ([place])    Sleep apnea    no current issues no longer CPAP machine    Past Surgical History:  Procedure Laterality Date   colonscopy     CORONARY ANGIOGRAPHY N/A 09/08/2021   Procedure: CORONARY ANGIOGRAPHY;  Surgeon: Sherren Mocha, MD;  Location: [facility];  Service: Cardiovascular;  Laterality: N/A;   CORONARY STENT INTERVENTION N/A 09/08/2021   Procedure: CORONARY STENT INTERVENTION;  Surgeon: Sherren Mocha, MD;  Location: [facility];  Service: Cardiovascular;  Laterality: N/A;   CYSTOSCOPY N/A 01/13/2020   Procedure: CYSTOSCOPY FLEXIBLE;  Surgeon: Lucas Mallow, MD;  Location: [facility];  Service: Urology;  Laterality: N/A;  NO SEEDS FOUND IN BLADDER   CYSTOSCOPY/URETEROSCOPY/HOLMIUM LASER/STENT PLACEMENT Left 06/15/2022   Procedure: CYSTOSCOPY LEFT URETEROSCOPY/HOLMIUM LASER/STENT PLACEMENT;  Surgeon: Lucas Mallow, MD;  Location: [facility];  Service: Urology;  Laterality: Left;  1 HR FOR CASE   ESOPHAGEAL MANOMETRY N/A 04/28/2022   Procedure: ESOPHAGEAL MANOMETRY (EM);  Surgeon: Wilford Corner, MD;  [facility]: [facility];  Service: Gastroenterology;  Laterality: N/A;   EXTRACORPOREAL SHOCK WAVE LITHOTRIPSY Left 06/10/2022   Procedure: LEFT EXTRACORPOREAL SHOCK WAVE LITHOTRIPSY (ESWL);  Surgeon: Remi Haggard, MD;  Location: [facility];  Service: Urology;  Laterality: Left;   EYE SURGERY     Lasik   INTRAVASCULAR PRESSURE WIRE/FFR STUDY N/A 09/08/2021   Procedure: INTRAVASCULAR PRESSURE WIRE/FFR STUDY;  Surgeon: Sherren Mocha, MD;  Location: [facility];  Service: Cardiovascular;  Laterality: N/A;   MASS EXCISION Left 02/08/2021   Procedure: EXCISION MASS OF MUCOID CYST WITH DISTAL INTERPHANGEAL JOINT ARTHROTOMY  LEFT MIDDLE FINGER;  Surgeon: [name], MD;  Location: [facility];  Service: Orthopedics;  Laterality: Left;   NASAL ENDOSCOPY     PROSTATE BIOPSY  may 2021 and 2020   RADIOACTIVE SEED IMPLANT N/A 01/13/2020   Procedure: RADIOACTIVE SEED IMPLANT/BRACHYTHERAPY IMPLANT;  Surgeon: Lucas Mallow, MD;  Location: [facility];  Service: Urology;  Laterality: N/A;    73 SEEDS IMPLANTED   SPACE OAR INSTILLATION N/A 01/13/2020   Procedure: SPACE OAR INSTILLATION;  Surgeon: Lucas Mallow, MD;  Location: [facility];  Service: Urology;  Laterality: N/A;   XI ROBOTIC ASSISTED HIATAL HERNIA REPAIR N/A 05/19/2022   Procedure: XI ROBOTIC ASSISTED REPAIR OF PARAESOPHAGEAL HIATAL HERNIA WITH FUNDOPLICATION;  Surgeon: Michael Boston, MD;  Location: [facility];  Service: General;  Laterality: N/A;    Current Medications: Current Meds  Medication Sig   amLODipine (NORVASC) 5 MG tablet Take 1 tablet (5 mg total) by mouth daily.   aspirin EC 81 MG tablet Take 81 mg by mouth daily. Swallow whole.   cholecalciferol (VITAMIN D3) 25 MCG (1000 UT) tablet Take 1,000 Units by mouth daily.   diltiazem (DILACOR XR) 240 MG 24 hr capsule Take 1 capsule (240 mg total) by mouth daily.   ezetimibe (ZETIA) 10 MG tablet Take 1 tablet (10 mg total) by mouth daily. (Patient taking differently: Take 10 mg by mouth every evening.)   ibuprofen (ADVIL) 200 MG tablet Take 200 mg by mouth every 6 (six) hours as needed for moderate pain.   ipratropium (ATROVENT) 0.06 % nasal spray Place 2 sprays into both nostrils 3 (three) times daily as needed for rhinitis.   lisinopril (ZESTRIL) 10 MG tablet Take 1 tablet (10 mg total) by mouth daily.   nitroGLYCERIN (NITROSTAT) 0.4 MG SL tablet Place 1 tablet (0.4 mg total) under the tongue every 5 (five) minutes as needed for chest pain. Do not use with tadalafil and sildenafil   Omega-3 Fatty Acids (FISH OIL) 1200 MG CAPS Take 1,200 mg by mouth 2  (two) times daily.   rosuvastatin (CRESTOR) 20 MG tablet Take 1 tablet (20 mg total) by mouth daily. (Patient taking differently: Take 20 mg by mouth every evening.)   Vibegron (GEMTESA) 75 MG TABS Take 75 mg by mouth daily.   Current Facility-Administered Medications for the 08/09/22 encounter (Office Visit) with Park Liter, MD  Medication   dexamethasone (DECADRON) injection 4 mg     Allergies:   Patient has no known allergies.   Social History   Socioeconomic History   Marital status: Married    Spouse name: Not on file   Number of children: 3   Years of education: Not on file   Highest education level: Not on file  Occupational History    Comment: full time  Tobacco Use   Smoking status: Former    Packs/day: 1.00    Years: 12.00    Total pack years: 12.00    Types: Cigarettes    Quit date: 05/30/1978    Years since quitting:  44.2   Smokeless tobacco: Never  Vaping Use   Vaping Use: Never used  Substance and Sexual Activity   Alcohol use: Yes    Comment: rarely   Drug use: Not Currently   Sexual activity: Not Currently  Other Topics Concern   Not on file  Social History Narrative   Not on file   Social Determinants of Health   Financial Resource Strain: Low Risk  (07/11/2022)   Overall Financial Resource Strain (CARDIA)    Difficulty of Paying Living Expenses: Not hard at all  Food Insecurity: No Food Insecurity (07/11/2022)   Hunger Vital Sign    Worried About Running Out of Food in the Last Year: Never true    Ran Out of Food in the Last Year: Never true  Transportation Needs: No Transportation Needs (07/11/2022)   PRAPARE - Hydrologist (Medical): No    Lack of Transportation (Non-Medical): No  Physical Activity: Sufficiently Active (07/11/2022)   Exercise Vital Sign    Days of Exercise per Week: 5 days    Minutes of Exercise per Session: 40 min  Stress: No Stress Concern Present (07/11/2022)   [place]    Feeling of Stress : Not at all  Social Connections: Socially Isolated (07/11/2022)   Social Connection and Isolation Panel [NHANES]    Frequency of Communication with Friends and Family: Once a week    Frequency of Social Gatherings with Friends and Family: Once a week    Attends Religious Services: Never    Marine scientist or Organizations: No    Attends Music therapist: Never    Marital Status: Married     Family History: The patient's family history includes Arthritis in [place] Stjulien's mother; Breast cancer in [place] Axel's sister; Diabetes in [place] Ley's maternal grandmother; Early death in [place] Heide's paternal grandfather and paternal grandmother; Hearing loss in [place] Ruehl's paternal grandfather and paternal grandmother; Heart disease in [place] Lococo's father; Hyperlipidemia in [place] Dumais's father; Hypertension in [place] Beasley's father. There is no history of Allergic rhinitis, Asthma, Eczema, Urticaria, Prostate cancer, Colon cancer, or Pancreatic cancer. ROS:   Please see the history of present illness.    All 14 point review of systems negative except as described per history of present illness  EKGs/Labs/Other Studies Reviewed:      Recent Labs: 07/01/2022: BUN 14; Creatinine, Ser 1.14; Hemoglobin 13.9; Platelets 191.0; Potassium 5.1; Sodium 138 08/01/2022: ALT 16  Recent Lipid Panel    Component Value Date/Time   CHOL 112 08/01/2022 1057   TRIG 85 08/01/2022 1057   HDL 45 08/01/2022 1057   CHOLHDL 2.5 08/01/2022 1057   CHOLHDL 4 06/29/2021 1107   VLDL 22.0 06/29/2021 1107   LDLCALC 50 08/01/2022 1057    Physical Exam:    VS:  BP 134/64 (BP Location: Right Arm, Patient Position: Sitting, Cuff Size: Normal)   Pulse (!) 56   Ht '6\' 2"'$  (1.88 m)   Wt 208 lb (94.3 kg)   SpO2 97%   BMI 26.71 kg/m     Wt Readings from Last 3 Encounters:  08/09/22 208 lb (94.3 kg)   07/01/22 221 lb 6 oz (100.4 kg)  06/15/22 235 lb 7.2 oz (106.8 kg)     GEN:  Well nourished, well developed in no acute distress HEENT: Normal NECK: No JVD; No carotid bruits LYMPHATICS: No lymphadenopathy CARDIAC: RRR, no murmurs, no rubs, no gallops  RESPIRATORY:  Clear to auscultation without rales, wheezing or rhonchi  ABDOMEN: Soft, non-tender, non-distended MUSCULOSKELETAL:  No edema; No deformity  SKIN: Warm and dry LOWER EXTREMITIES: no swelling NEUROLOGIC:  Alert and oriented x 3 PSYCHIATRIC:  Normal affect   ASSESSMENT:    1. Coronary artery disease involving native coronary artery of native heart without angina pectoris   2. Essential hypertension   3. Near syncope   4. Paroxysmal ventricular tachycardia ([place])   5. Gastroesophageal reflux disease, unspecified whether esophagitis present    PLAN:    In order of problems listed above:  Coronary disease stable from that point review and appropriate medication which include antiplatelet therapy which I will continue. Essential hypertension blood pressure well-controlled continue present management. Near syncopal episode he did wear monitor before does have some episode of nonsustained ventricular tachycardia but still no clear-cut explanation for his symptomatology we will put another monitor to make sure we do not dealing with worsening of ventricular tachycardia.  Part of ablation echocardiogram will be done to assess left ventricle ejection fraction. Dyslipidemia he is taking statin Crestor 20 which is high intense statin which I will continue.  I do have his fasting lipid profile from just few days ago which LDL of 50 HDL 45 excellent cholesterol profile continue present management   Medication Adjustments/Labs and Tests Ordered: Current medicines are reviewed at length with the patient today.  Concerns regarding medicines are outlined above.  No orders of the defined types were placed in this encounter.  Medication  changes: No orders of the defined types were placed in this encounter.   Signed, Park Liter, MD, [facility] 08/09/2022 10:33 AM    [place]

## 2022-08-09 NOTE — Addendum Note (Signed)
Addended by: Jacobo Forest D on: 08/09/2022 10:47 AM   Modules accepted: Orders

## 2022-08-24 ENCOUNTER — Encounter: Payer: Self-pay | Admitting: Cardiology

## 2022-08-24 DIAGNOSIS — M79605 Pain in left leg: Secondary | ICD-10-CM

## 2022-08-26 DIAGNOSIS — R55 Syncope and collapse: Secondary | ICD-10-CM | POA: Diagnosis not present

## 2022-08-31 ENCOUNTER — Telehealth: Payer: Self-pay

## 2022-08-31 MED ORDER — DILTIAZEM HCL ER COATED BEADS 360 MG PO CP24: 360.0000 mg | ORAL_CAPSULE | Freq: Every day | ORAL | 3 refills | Status: DC

## 2022-08-31 NOTE — Telephone Encounter (Signed)
Spoke with pt about monitor results per Dr. Wendy Poet note. Pt agreed to increase Cardizem to 360mg  daily. Encouraged to call with any problems or concerns.

## 2022-09-02 ENCOUNTER — Telehealth: Payer: Self-pay

## 2022-09-02 NOTE — Telephone Encounter (Signed)
Spoke with pt about Cardizem and Amlodipine. He said the pharmacy warned him whne he picked it upand he wondered why. Per Dr. Bing Matter he advised to stop Amlodipine and keep a BP log. He stated that we could adjust Lisinopril if needed. Pt agreed and verbalized understanding. Dr. Carmelia Roller had prescribed the Amlodipine.

## 2022-09-05 ENCOUNTER — Ambulatory Visit (HOSPITAL_COMMUNITY): Payer: PPO | Attending: Cardiology

## 2022-09-05 DIAGNOSIS — R0609 Other forms of dyspnea: Secondary | ICD-10-CM | POA: Insufficient documentation

## 2022-09-05 LAB — ECHOCARDIOGRAM COMPLETE
Area-P 1/2: 2.83 cm2
S' Lateral: 3.6 cm

## 2022-09-07 ENCOUNTER — Ambulatory Visit (HOSPITAL_COMMUNITY)
Admission: RE | Admit: 2022-09-07 | Discharge: 2022-09-07 | Disposition: A | Discharge status: Home / Self Care | Payer: PPO | Source: Ambulatory Visit | Attending: Cardiology | Admitting: Cardiology

## 2022-09-07 ENCOUNTER — Telehealth: Payer: Self-pay

## 2022-09-07 DIAGNOSIS — M79605 Pain in left leg: Secondary | ICD-10-CM | POA: Diagnosis not present

## 2022-09-07 NOTE — Telephone Encounter (Signed)
Left message on My Chart with normal Echo results per Dr. Krasowski's note. Routed to PCP.  

## 2022-09-08 ENCOUNTER — Telehealth: Payer: Self-pay

## 2022-09-08 NOTE — Telephone Encounter (Signed)
Left message on My Chart with [place] results per Dr. Vanetta Shawl note. Routed to PCP.

## 2022-09-08 NOTE — Telephone Encounter (Signed)
Pt viewed Echo results in My Chart per Dr. Vanetta Shawl note. Routed to PCP.

## 2022-09-10 ENCOUNTER — Encounter: Payer: Self-pay | Admitting: Family Medicine

## 2022-09-10 DIAGNOSIS — I10 Essential (primary) hypertension: Secondary | ICD-10-CM

## 2022-09-12 MED ORDER — LISINOPRIL 10 MG PO TABS
10.0000 mg | ORAL_TABLET | Freq: Every day | ORAL | 3 refills | Status: DC
Start: 2022-09-12 — End: 2022-09-12

## 2022-09-12 MED ORDER — LISINOPRIL 10 MG PO TABS
10.0000 mg | ORAL_TABLET | Freq: Every day | ORAL | 3 refills | Status: DC
Start: 2022-09-12 — End: 2023-04-11

## 2022-09-12 NOTE — Addendum Note (Signed)
Addended by: Scharlene Gloss B on: 09/12/2022 08:27 AM   Modules accepted: Orders

## 2022-09-22 ENCOUNTER — Other Ambulatory Visit: Payer: Self-pay | Admitting: Family Medicine

## 2022-10-11 DIAGNOSIS — K623 Rectal prolapse: Secondary | ICD-10-CM | POA: Diagnosis not present

## 2022-10-11 DIAGNOSIS — K2270 Barrett's esophagus without dysplasia: Secondary | ICD-10-CM | POA: Diagnosis not present

## 2022-10-18 DIAGNOSIS — H2513 Age-related nuclear cataract, bilateral: Secondary | ICD-10-CM | POA: Diagnosis not present

## 2022-10-31 DIAGNOSIS — K2930 Chronic superficial gastritis without bleeding: Secondary | ICD-10-CM | POA: Diagnosis not present

## 2022-10-31 DIAGNOSIS — K2270 Barrett's esophagus without dysplasia: Secondary | ICD-10-CM | POA: Diagnosis not present

## 2022-10-31 DIAGNOSIS — K219 Gastro-esophageal reflux disease without esophagitis: Secondary | ICD-10-CM | POA: Diagnosis not present

## 2022-10-31 DIAGNOSIS — K259 Gastric ulcer, unspecified as acute or chronic, without hemorrhage or perforation: Secondary | ICD-10-CM | POA: Diagnosis not present

## 2022-11-07 ENCOUNTER — Ambulatory Visit: Payer: Self-pay | Admitting: Surgery

## 2022-11-07 DIAGNOSIS — K6289 Other specified diseases of anus and rectum: Secondary | ICD-10-CM

## 2022-11-07 DIAGNOSIS — Z8546 Personal history of malignant neoplasm of prostate: Secondary | ICD-10-CM | POA: Insufficient documentation

## 2022-11-07 DIAGNOSIS — K643 Fourth degree hemorrhoids: Secondary | ICD-10-CM

## 2022-11-07 DIAGNOSIS — Z923 Personal history of irradiation: Secondary | ICD-10-CM

## 2022-11-07 DIAGNOSIS — K5909 Other constipation: Secondary | ICD-10-CM | POA: Diagnosis not present

## 2022-11-07 DIAGNOSIS — K642 Third degree hemorrhoids: Secondary | ICD-10-CM | POA: Diagnosis not present

## 2022-11-07 HISTORY — DX: Other specified diseases of anus and rectum: K62.89

## 2022-11-07 HISTORY — DX: Personal history of malignant neoplasm of prostate: Z85.46

## 2022-11-07 HISTORY — DX: Fourth degree hemorrhoids: K64.3

## 2022-11-07 HISTORY — DX: Personal history of irradiation: Z92.3

## 2022-11-09 DIAGNOSIS — K2270 Barrett's esophagus without dysplasia: Secondary | ICD-10-CM | POA: Diagnosis not present

## 2022-11-09 DIAGNOSIS — K2930 Chronic superficial gastritis without bleeding: Secondary | ICD-10-CM | POA: Diagnosis not present

## 2022-12-20 ENCOUNTER — Telehealth: Payer: Self-pay | Admitting: *Deleted

## 2022-12-20 ENCOUNTER — Ambulatory Visit: Payer: PPO | Attending: Nurse Practitioner | Admitting: Nurse Practitioner

## 2022-12-20 DIAGNOSIS — Z01810 Encounter for preprocedural cardiovascular examination: Secondary | ICD-10-CM | POA: Diagnosis not present

## 2022-12-20 NOTE — Telephone Encounter (Signed)
I s/w the pt to set up a tele pre op appt. When I mentioned the surgery that was listed as he was having 12/22/22 Robotic Repair of Hiatal Hernia , pt tells me that was the surgery he had last year. Pt tells me the surgery he is having 12/22/22 is for hemorrhoid repair. I assured the pt that I will call the surgeon's office to confirm the procedure and will call him back.   I left a message for Dawn the surgery scheduler to call back with the correct procedure. Once I have clarification for procedure I will call the pt to set up tele appt.   I called the pt and set up tele pre op appt add on today due to proc date 12/22/22. Pt thanked me for the help.

## 2022-12-20 NOTE — Telephone Encounter (Signed)
   Pre-operative Risk Assessment    Patient Name: Dean Guzman  DOB: 11/08/1950 MRN: [id number]      Request for Surgical Clearance    Procedure:   Robotic Repair of Hiatal Hernia  Date of Surgery:  Clearance 12/22/22                                 Surgeon:  Dr. Karie Soda Surgeon's Group or Practice Name:  [facility] Phone number:  [phone] Fax number:  [phone]   Type of Clearance Requested:   - Medical  - Pharmacy:  Hold Aspirin Not Indicated   Type of Anesthesia:  General    Additional requests/questions:    Signed, Emmit Pomfret   12/20/2022, 12:35 PM

## 2022-12-20 NOTE — Telephone Encounter (Signed)
I tried to call Dean Guzman, surgery scheduler again with Dr. Michaell Cowing office, as we are needing to confirm the procedure to be done. For further info, see previous notes from today. I can see some notes in the computer and it does reflect that pt is having hemorrhoid surgery, though need to confirm.

## 2022-12-20 NOTE — Progress Notes (Signed)
Virtual Visit via Telephone Note   Because of Dean Guzman's co-morbid illnesses, Dean Guzman is at least at moderate risk for complications without adequate follow up.  This format is felt to be most appropriate for this patient at this time.  The patient did not have access to video technology/had technical difficulties with video requiring transitioning to audio format only (telephone).  All issues noted in this document were discussed and addressed.  No physical exam could be performed with this format.  Please refer to the patient's chart for Dean Guzman's consent to telehealth for [facility].  Evaluation Performed:  Preoperative cardiovascular risk assessment _____________   Date:  12/20/2022   Patient ID:  Dean Guzman, DOB 06-07-1950, MRN [id number] Patient Location:  Home Provider location:   Office  Primary Care Provider:  Sharlene Dory, DO Primary Cardiologist:  None  Chief Complaint / Patient Profile   72 y.o. y/o male with a h/o CAD s/p DES-LAD, NSVT, near syncope, hypertension, hyperlipidemia, GERD, and hiatal hernia who is pending hemorrhoidectomy on 12/22/2022 with Dr. Karie Soda of [facility] surgery and presents today for telephonic preoperative cardiovascular risk assessment.  History of Present Illness    Dean Guzman is a 72 y.o. male who presents via audio/video conferencing for a telehealth visit today.  Pt was last seen in cardiology clinic on 08/09/2022 by Dr. Bing Matter.  At that time Dean Guzman was doing well.  The patient is now pending procedure as outlined above. Since Dean Guzman's last visit, Dean Guzman has done well from a cardiac standpoint.   He denies chest pain, palpitations, dyspnea, pnd, orthopnea, n, v, dizziness, syncope, edema, weight gain, or early satiety. All other systems reviewed and are otherwise negative except as noted above.   Past Medical History    Past Medical History:  Diagnosis Date   Barrett's esophagus    BPH  (benign prostatic hyperplasia)    Colon polyps    Complication of anesthesia    during biopsy, felt hot and sweating   Coronary artery disease    stent 4 2023   FH: factor V Leiden mutation    pt tested negatiove    niece died of leiden factor five complications, sister has   Frequent headaches    GERD (gastroesophageal reflux disease)    History of hiatal hernia    History of kidney stones    Hyperlipidemia    Hypertension    Migraines     no recent migraines had visual aura   Neuromuscular disorder (HCC)    neuropathy feet   Pneumonia    Prostate cancer (HCC)    Sleep apnea    no current issues no longer CPAP machine   Past Surgical History:  Procedure Laterality Date   colonscopy     CORONARY ANGIOGRAPHY N/A 09/08/2021   Procedure: CORONARY ANGIOGRAPHY;  Surgeon: Tonny Bollman, MD;  Location: [facility] INVASIVE CV LAB;  Service: Cardiovascular;  Laterality: N/A;   CORONARY PRESSURE/FFR STUDY N/A 09/08/2021   Procedure: INTRAVASCULAR PRESSURE WIRE/FFR STUDY;  Surgeon: Tonny Bollman, MD;  Location: [facility] INVASIVE CV LAB;  Service: Cardiovascular;  Laterality: N/A;   CORONARY STENT INTERVENTION N/A 09/08/2021   Procedure: CORONARY STENT INTERVENTION;  Surgeon: Tonny Bollman, MD;  Location: [facility] INVASIVE CV LAB;  Service: Cardiovascular;  Laterality: N/A;   CYSTOSCOPY N/A 01/13/2020   Procedure: CYSTOSCOPY FLEXIBLE;  Surgeon: Crista Elliot, MD;  Location: [facility];  Service: Urology;  Laterality: N/A;  NO SEEDS FOUND IN BLADDER  CYSTOSCOPY/URETEROSCOPY/HOLMIUM LASER/STENT PLACEMENT Left 06/15/2022   Procedure: CYSTOSCOPY LEFT URETEROSCOPY/HOLMIUM LASER/STENT PLACEMENT;  Surgeon: Crista Elliot, MD;  Location: [facility];  Service: Urology;  Laterality: Left;  1 HR FOR CASE   ESOPHAGEAL MANOMETRY N/A 04/28/2022   Procedure: ESOPHAGEAL MANOMETRY (EM);  Surgeon: Charlott Rakes, MD;  Location: [facility];  Service: Gastroenterology;  Laterality: N/A;    EXTRACORPOREAL SHOCK WAVE LITHOTRIPSY Left 06/10/2022   Procedure: LEFT EXTRACORPOREAL SHOCK WAVE LITHOTRIPSY (ESWL);  Surgeon: Belva Agee, MD;  Location: [facility];  Service: Urology;  Laterality: Left;   EYE SURGERY     Lasik   MASS EXCISION Left 02/08/2021   Procedure: EXCISION MASS OF MUCOID CYST WITH DISTAL INTERPHANGEAL JOINT ARTHROTOMY LEFT MIDDLE FINGER;  Surgeon: Betha Loa, MD;  Location: [facility];  Service: Orthopedics;  Laterality: Left;   NASAL ENDOSCOPY     PROSTATE BIOPSY  may 2021 and 2020   RADIOACTIVE SEED IMPLANT N/A 01/13/2020   Procedure: RADIOACTIVE SEED IMPLANT/BRACHYTHERAPY IMPLANT;  Surgeon: Crista Elliot, MD;  Location: [facility] [place];  Service: Urology;  Laterality: N/A;    73 SEEDS IMPLANTED   SPACE OAR INSTILLATION N/A 01/13/2020   Procedure: SPACE OAR INSTILLATION;  Surgeon: Crista Elliot, MD;  Location: [facility];  Service: Urology;  Laterality: N/A;   XI ROBOTIC ASSISTED HIATAL HERNIA REPAIR N/A 05/19/2022   Procedure: XI ROBOTIC ASSISTED REPAIR OF PARAESOPHAGEAL HIATAL HERNIA WITH FUNDOPLICATION;  Surgeon: Karie Soda, MD;  Location: [facility];  Service: General;  Laterality: N/A;    Allergies  No Known Allergies  Home Medications    Prior to Admission medications   Medication Sig Start Date End Date Taking? Authorizing Provider  amLODipine (NORVASC) 5 MG tablet TAKE 1 TABLET (5 MG TOTAL) BY MOUTH DAILY. 09/22/22 12/31/22  Sharlene Dory, DO  aspirin EC 81 MG tablet Take 81 mg by mouth daily. Swallow whole.    [provider]  cholecalciferol (VITAMIN D3) 25 MCG (1000 UT) tablet Take 1,000 Units by mouth daily.    [provider]  diltiazem (CARDIZEM CD) 360 MG 24 hr capsule Take 1 capsule (360 mg total) by mouth daily. 08/31/22   Georgeanna Lea, MD  ezetimibe (ZETIA) 10 MG tablet Take 1 tablet (10 mg total) by mouth daily. Patient taking  differently: Take 10 mg by mouth every evening. 06/09/22   Georgeanna Lea, MD  ibuprofen (ADVIL) 200 MG tablet Take 200 mg by mouth every 6 (six) hours as needed for moderate pain.    [provider]  ipratropium (ATROVENT) 0.06 % nasal spray Place 2 sprays into both nostrils 3 (three) times daily as needed for rhinitis. 07/18/22   Sharlene Dory, DO  lisinopril (ZESTRIL) 10 MG tablet Take 1 tablet (10 mg total) by mouth daily. 09/12/22 09/07/23  Sharlene Dory, DO  nitroGLYCERIN (NITROSTAT) 0.4 MG SL tablet Place 1 tablet (0.4 mg total) under the tongue every 5 (five) minutes as needed for chest pain. Do not use with tadalafil and sildenafil 09/08/21 09/08/22  Cyndi Bender, NP  Omega-3 Fatty Acids (FISH OIL) 1200 MG CAPS Take 1,200 mg by mouth 2 (two) times daily.    [provider]  rosuvastatin (CRESTOR) 20 MG tablet Take 1 tablet (20 mg total) by mouth daily. Patient taking differently: Take 20 mg by mouth every evening. 06/09/22   Georgeanna Lea, MD  Vibegron (GEMTESA) 75 MG TABS Take 75 mg by  mouth daily.    [provider]    Physical Exam    Vital Signs:  Rucker Pridgeon does not have vital signs available for review today.  Given telephonic nature of communication, physical exam is limited. AAOx3. NAD. Normal affect.  Speech and respirations are unlabored.  Accessory Clinical Findings    None  Assessment & Plan    1.  Preoperative Cardiovascular Risk Assessment:  According to the Revised Cardiac Risk Index (RCRI), Yurem's Perioperative Risk of Major Cardiac Event is (%): 0.9. Jabbar's Functional Capacity in METs is: 8.97 according to the Duke Activity Status Index (DASI). Therefore, based on ACC/AHA guidelines, patient would be at acceptable risk for the planned procedure without further cardiovascular testing.  The patient was advised that if he develops new symptoms prior to surgery to contact our office to arrange for a follow-up  visit, and Enzo verbalized understanding.  Regarding ASA therapy, we recommend continuation of ASA throughout the perioperative period.  However, if the surgeon feels that cessation of ASA is required in the perioperative period, it may be stopped 5-7 days prior to surgery with a plan to resume it as soon as felt to be feasible from a surgical standpoint in the post-operative period.   A copy of this note will be routed to requesting surgeon.  Time:   Today, I have spent 6 minutes with the patient with telehealth technology discussing medical history, symptoms, and management plan.     Joylene Grapes, NP  12/20/2022, 3:09 PM

## 2022-12-20 NOTE — Telephone Encounter (Signed)
I s/w the pt to set up a tele pre op appt. When I mentioned the surgery that was listed as he was having 12/22/22 Robotic Repair of Hiatal Hernia , pt tells me that was the surgery he had last year. Pt tells me the surgery he is having 12/22/22 is for hemorrhoid repair. I assured the pt that I will call the surgeon's office to confirm the procedure and will call him back.    I left a message for Dawn the surgery scheduler to call back with the correct procedure. Once I have clarification for procedure I will call the pt to set up tele appt.     I called the pt and set up tele pre op appt add on today due to proc date 12/22/22. Pt thanked me for the help.           Patient Consent for Virtual Visit        Dean Guzman has provided verbal consent on 12/20/2022 for a virtual visit (video or telephone).   CONSENT FOR VIRTUAL VISIT FOR:  Dean Guzman  By participating in this virtual visit I agree to the following:  I hereby voluntarily request, consent and authorize [facility] and its employed or contracted physicians, physician assistants, nurse practitioners or other licensed health care professionals (the Practitioner), to provide me with telemedicine health care services (the "Services") as deemed necessary by the treating Practitioner. I acknowledge and consent to receive the Services by the Practitioner via telemedicine. I understand that the telemedicine visit will involve communicating with the Practitioner through live audiovisual communication technology and the disclosure of certain medical information by electronic transmission. I acknowledge that I have been given the opportunity to request an in-person assessment or other available alternative prior to the telemedicine visit and am voluntarily participating in the telemedicine visit.  I understand that I have the right to withhold or withdraw my consent to the use of telemedicine in the course of my care at any time,  without affecting my right to future care or treatment, and that the Practitioner or I may terminate the telemedicine visit at any time. I understand that I have the right to inspect all information obtained and/or recorded in the course of the telemedicine visit and may receive copies of available information for a reasonable fee.  I understand that some of the potential risks of receiving the Services via telemedicine include:  Delay or interruption in medical evaluation due to technological equipment failure or disruption; Information transmitted may not be sufficient (e.g. poor resolution of images) to allow for appropriate medical decision making by the Practitioner; and/or  In rare instances, security protocols could fail, causing a breach of personal health information.  Furthermore, I acknowledge that it is my responsibility to provide information about my medical history, conditions and care that is complete and accurate to the best of my ability. I acknowledge that Practitioner's advice, recommendations, and/or decision may be based on factors not within their control, such as incomplete or inaccurate data provided by me or distortions of diagnostic images or specimens that may result from electronic transmissions. I understand that the practice of medicine is not an exact science and that Practitioner makes no warranties or guarantees regarding treatment outcomes. I acknowledge that a copy of this consent can be made available to me via my patient portal [facility] MyChart), or I can request a printed copy by calling the office of [facility].    I understand that  my insurance will be billed for this visit.   I have read or had this consent read to me. I understand the contents of this consent, which adequately explains the benefits and risks of the Services being provided via telemedicine.  I have been provided ample opportunity to ask questions regarding this consent and the  Services and have had my questions answered to my satisfaction. I give my informed consent for the services to be provided through the use of telemedicine in my medical care

## 2022-12-20 NOTE — Telephone Encounter (Signed)
I s/w Dean Guzman from surgeon's office. Dean Guzman confirmed that the pt is having HEMORRHOIDECTOMY, HEMORRHOIDOPEXY AND  ANAL EUA  UNDER GENERAL AND LOCAL ANESTHESIA

## 2022-12-20 NOTE — Telephone Encounter (Signed)
   Name: Dean Guzman  DOB: May 26, 1951  MRN: [id number]  Primary Cardiologist: None   Preoperative team, please contact this patient and set up a phone call appointment for further preoperative risk assessment. Please obtain consent and complete medication review. Thank you for your help.  I confirm that guidance regarding antiplatelet and oral anticoagulation therapy has been completed and, if necessary, noted below.  Regarding ASA therapy, we recommend continuation of ASA throughout the perioperative period.  However, if the surgeon feels that cessation of ASA is required in the perioperative period, it may be stopped 5-7 days prior to surgery with a plan to resume it as soon as felt to be feasible from a surgical standpoint in the post-operative period.    Joylene Grapes, NP 12/20/2022, 12:46 PM [facility]

## 2022-12-22 DIAGNOSIS — K644 Residual hemorrhoidal skin tags: Secondary | ICD-10-CM | POA: Diagnosis not present

## 2022-12-22 DIAGNOSIS — K649 Unspecified hemorrhoids: Secondary | ICD-10-CM | POA: Diagnosis not present

## 2022-12-22 DIAGNOSIS — K642 Third degree hemorrhoids: Secondary | ICD-10-CM | POA: Diagnosis not present

## 2022-12-28 ENCOUNTER — Encounter: Payer: Self-pay | Admitting: Family Medicine

## 2022-12-30 ENCOUNTER — Encounter: Payer: PPO | Admitting: Family Medicine

## 2023-01-12 ENCOUNTER — Encounter (INDEPENDENT_AMBULATORY_CARE_PROVIDER_SITE_OTHER): Payer: Self-pay

## 2023-01-17 DIAGNOSIS — C61 Malignant neoplasm of prostate: Secondary | ICD-10-CM | POA: Diagnosis not present

## 2023-01-24 DIAGNOSIS — C61 Malignant neoplasm of prostate: Secondary | ICD-10-CM | POA: Diagnosis not present

## 2023-02-02 DIAGNOSIS — R110 Nausea: Secondary | ICD-10-CM | POA: Diagnosis not present

## 2023-02-02 DIAGNOSIS — K2980 Duodenitis without bleeding: Secondary | ICD-10-CM | POA: Diagnosis not present

## 2023-02-02 DIAGNOSIS — Z79899 Other long term (current) drug therapy: Secondary | ICD-10-CM | POA: Diagnosis not present

## 2023-02-02 DIAGNOSIS — A09 Infectious gastroenteritis and colitis, unspecified: Secondary | ICD-10-CM | POA: Diagnosis not present

## 2023-02-02 DIAGNOSIS — K8500 Idiopathic acute pancreatitis without necrosis or infection: Secondary | ICD-10-CM | POA: Diagnosis not present

## 2023-02-02 DIAGNOSIS — R918 Other nonspecific abnormal finding of lung field: Secondary | ICD-10-CM | POA: Diagnosis not present

## 2023-02-02 DIAGNOSIS — Z20822 Contact with and (suspected) exposure to covid-19: Secondary | ICD-10-CM | POA: Diagnosis not present

## 2023-02-02 DIAGNOSIS — K859 Acute pancreatitis without necrosis or infection, unspecified: Secondary | ICD-10-CM | POA: Diagnosis not present

## 2023-02-02 DIAGNOSIS — R109 Unspecified abdominal pain: Secondary | ICD-10-CM | POA: Diagnosis not present

## 2023-02-02 DIAGNOSIS — R1013 Epigastric pain: Secondary | ICD-10-CM | POA: Diagnosis not present

## 2023-02-03 DIAGNOSIS — Z7982 Long term (current) use of aspirin: Secondary | ICD-10-CM | POA: Diagnosis not present

## 2023-02-03 DIAGNOSIS — A084 Viral intestinal infection, unspecified: Secondary | ICD-10-CM | POA: Diagnosis not present

## 2023-02-03 DIAGNOSIS — D638 Anemia in other chronic diseases classified elsewhere: Secondary | ICD-10-CM | POA: Diagnosis not present

## 2023-02-03 DIAGNOSIS — K859 Acute pancreatitis without necrosis or infection, unspecified: Secondary | ICD-10-CM | POA: Diagnosis not present

## 2023-02-03 DIAGNOSIS — K8500 Idiopathic acute pancreatitis without necrosis or infection: Secondary | ICD-10-CM | POA: Diagnosis not present

## 2023-02-03 DIAGNOSIS — Z8546 Personal history of malignant neoplasm of prostate: Secondary | ICD-10-CM | POA: Diagnosis not present

## 2023-02-03 DIAGNOSIS — I2510 Atherosclerotic heart disease of native coronary artery without angina pectoris: Secondary | ICD-10-CM | POA: Diagnosis not present

## 2023-02-03 DIAGNOSIS — K567 Ileus, unspecified: Secondary | ICD-10-CM

## 2023-02-03 DIAGNOSIS — D696 Thrombocytopenia, unspecified: Secondary | ICD-10-CM | POA: Diagnosis not present

## 2023-02-03 DIAGNOSIS — K56600 Partial intestinal obstruction, unspecified as to cause: Secondary | ICD-10-CM | POA: Diagnosis not present

## 2023-02-03 DIAGNOSIS — Z955 Presence of coronary angioplasty implant and graft: Secondary | ICD-10-CM | POA: Diagnosis not present

## 2023-02-03 DIAGNOSIS — Z79899 Other long term (current) drug therapy: Secondary | ICD-10-CM | POA: Diagnosis not present

## 2023-02-03 DIAGNOSIS — I10 Essential (primary) hypertension: Secondary | ICD-10-CM | POA: Diagnosis not present

## 2023-02-03 DIAGNOSIS — D490 Neoplasm of unspecified behavior of digestive system: Secondary | ICD-10-CM | POA: Diagnosis not present

## 2023-02-03 DIAGNOSIS — D649 Anemia, unspecified: Secondary | ICD-10-CM | POA: Insufficient documentation

## 2023-02-03 DIAGNOSIS — K2980 Duodenitis without bleeding: Secondary | ICD-10-CM | POA: Diagnosis not present

## 2023-02-03 DIAGNOSIS — Z20822 Contact with and (suspected) exposure to covid-19: Secondary | ICD-10-CM | POA: Diagnosis not present

## 2023-02-03 DIAGNOSIS — E785 Hyperlipidemia, unspecified: Secondary | ICD-10-CM | POA: Diagnosis not present

## 2023-02-03 DIAGNOSIS — D509 Iron deficiency anemia, unspecified: Secondary | ICD-10-CM | POA: Diagnosis not present

## 2023-02-03 DIAGNOSIS — R918 Other nonspecific abnormal finding of lung field: Secondary | ICD-10-CM | POA: Diagnosis not present

## 2023-02-03 DIAGNOSIS — R109 Unspecified abdominal pain: Secondary | ICD-10-CM | POA: Diagnosis not present

## 2023-02-03 DIAGNOSIS — K862 Cyst of pancreas: Secondary | ICD-10-CM | POA: Diagnosis not present

## 2023-02-03 DIAGNOSIS — Z87442 Personal history of urinary calculi: Secondary | ICD-10-CM | POA: Diagnosis not present

## 2023-02-03 DIAGNOSIS — K2270 Barrett's esophagus without dysplasia: Secondary | ICD-10-CM | POA: Diagnosis not present

## 2023-02-03 HISTORY — DX: Ileus, unspecified: K56.7

## 2023-02-03 HISTORY — DX: Anemia, unspecified: D64.9

## 2023-02-04 ENCOUNTER — Encounter: Payer: Self-pay | Admitting: Cardiology

## 2023-02-05 ENCOUNTER — Encounter: Payer: Self-pay | Admitting: Family Medicine

## 2023-02-07 ENCOUNTER — Telehealth: Payer: Self-pay

## 2023-02-07 NOTE — Transitions of Care (Post Inpatient/ED Visit) (Signed)
02/07/2023  Name: Dean Guzman MRN: [id number] DOB: 1951/01/05  Today's TOC FU Call Status: Today's TOC FU Call Status:: Successful TOC FU Call Completed TOC FU Call Complete Date: 02/07/23 Patient's Name and Date of Birth confirmed.  Transition Care Management Follow-up Telephone Call Date of Discharge: 02/05/23 Discharge Facility: Other ([facility]) Name of Other ([facility]) Discharge Facility: [facility] Type of Discharge: Inpatient Admission Primary Inpatient Discharge Diagnosis:: Pancreatitis, Ileus How have you been since you were released from the hospital?: Better (Patient was airlifted off a cruise ship in [place] and was admitted to [facility]) Any questions or concerns?: No  Items Reviewed: Did you receive and understand the discharge instructions provided?: Yes Medications obtained,verified, and reconciled?: Yes (Medications Reviewed) Any new allergies since your discharge?: No Dietary orders reviewed?: No Do you have support at home?: Yes People in Home: spouse Name of Support/Comfort Primary Source: Su Monks  Medications Reviewed Today: Medications Reviewed Today     Reviewed by Jodelle Gross, RN (Case Manager) on 02/07/23 at 1613  Med List Status: <None>   Medication Order Taking? Sig Documenting Provider Last Dose Status Informant  amLODipine (NORVASC) 5 MG tablet [id number]  TAKE 1 TABLET (5 MG TOTAL) BY MOUTH DAILY. Sharlene Dory, DO  Expired 12/31/22 2359   aspirin EC 81 MG tablet [id number] Yes Take 81 mg by mouth daily. Swallow whole. [provider] Taking Active Self  cholecalciferol (VITAMIN D3) 25 MCG (1000 UT) tablet [id number] Yes Take 1,000 Units by mouth daily. [provider] Taking Active Self  diltiazem (CARDIZEM CD) 360 MG 24 hr capsule [id number] Yes Take 1 capsule (360 mg total) by mouth daily. Georgeanna Lea, MD Taking Active   ezetimibe (ZETIA) 10 MG tablet [id number] Yes Take 1  tablet (10 mg total) by mouth daily.  Patient taking differently: Take 10 mg by mouth every evening.   Georgeanna Lea, MD Taking Active Self  ibuprofen (ADVIL) 200 MG tablet [id number] Yes Take 200 mg by mouth every 6 (six) hours as needed for moderate pain. [provider] Taking Active Self  ipratropium (ATROVENT) 0.06 % nasal spray [id number] Yes Place 2 sprays into both nostrils 3 (three) times daily as needed for rhinitis. Sharlene Dory, DO Taking Active   lisinopril (ZESTRIL) 10 MG tablet [id number] Yes Take 1 tablet (10 mg total) by mouth daily. Sharlene Dory, DO Taking Active   nitroGLYCERIN (NITROSTAT) 0.4 MG SL tablet [id number]  Place 1 tablet (0.4 mg total) under the tongue every 5 (five) minutes as needed for chest pain. Do not use with tadalafil and sildenafil Cyndi Bender, NP  Expired 09/08/22 2359 Self           Med Note (HAZLIP, JESSICA K   Thu May 19, 2022  6:08 AM) Never taken  Omega-3 Fatty Acids (FISH OIL) 1200 MG CAPS [id number] Yes Take 1,200 mg by mouth 2 (two) times daily. [provider] Taking Active Self  rosuvastatin (CRESTOR) 20 MG tablet [id number] Yes Take 1 tablet (20 mg total) by mouth daily.  Patient taking differently: Take 20 mg by mouth every evening.   Georgeanna Lea, MD Taking Active Self  Vibegron [facility]) 75 MG TABS [id number] Yes Take 75 mg by mouth daily. [provider] Taking Active Self            Home Care and Equipment/Supplies: Were Home Health Services Ordered?: No Any new equipment or medical supplies ordered?: No  Functional Questionnaire: Do you need  assistance with bathing/showering or dressing?: No Do you need assistance with meal preparation?: No Do you need assistance with eating?: No Do you have difficulty maintaining continence: No Do you need assistance with getting out of bed/getting out of a chair/moving?: No Do you have difficulty managing or taking your medications?:  No  Follow up appointments reviewed: PCP Follow-up appointment confirmed?: Yes Date of PCP follow-up appointment?: 02/10/23 Follow-up Provider: Dr. Carnella Gu[place] Specialist [facility] Follow-up appointment confirmed?: NA Do you need transportation to your follow-up appointment?: No Do you understand care options if your condition(s) worsen?: Yes-patient verbalized understanding  SDOH Interventions Today    Flowsheet Row Most Recent Value  SDOH Interventions   Food Insecurity Interventions Intervention Not Indicated  Housing Interventions Intervention Not Indicated     Jodelle Gross RN, BSN, CCM [facility] RN Care Coordinator/ Transitions of Care Direct Dial: [phone]  Fax: [phone]

## 2023-02-10 ENCOUNTER — Ambulatory Visit (INDEPENDENT_AMBULATORY_CARE_PROVIDER_SITE_OTHER): Payer: PPO | Admitting: Family Medicine

## 2023-02-10 ENCOUNTER — Encounter: Payer: Self-pay | Admitting: Family Medicine

## 2023-02-10 VITALS — BP 112/72 | HR 50 | Temp 97.7°F | Ht 74.0 in | Wt 195.1 lb

## 2023-02-10 DIAGNOSIS — I10 Essential (primary) hypertension: Secondary | ICD-10-CM | POA: Diagnosis not present

## 2023-02-10 DIAGNOSIS — D649 Anemia, unspecified: Secondary | ICD-10-CM

## 2023-02-10 DIAGNOSIS — Z0000 Encounter for general adult medical examination without abnormal findings: Secondary | ICD-10-CM

## 2023-02-10 DIAGNOSIS — K859 Acute pancreatitis without necrosis or infection, unspecified: Secondary | ICD-10-CM

## 2023-02-10 NOTE — Progress Notes (Signed)
Chief Complaint  Patient presents with   Annual Exam    Well Male Dean Guzman is here for a complete physical.   Coree's last physical was >1 year ago.  Current diet: in general, diet is OK.   Current exercise: active in yard Weight trend: decreased Fatigue out of ordinary? No. Seat belt? Yes.   Advanced directive? Yes   Health maintenance Shingrix- Yes Colonoscopy- Yes Tetanus- Due Hep C- Yes Pneumonia vaccine- Yes AAA screening- no AAA on imaging on 01/17/22  Hospitalization The patient was recently admitted to [facility] for an ileus and acute pancreatitis.  He had a CT scan that did show fat stranding and inflammation of the pancreas in addition to a possible neoplasm of the pancreas.  Follow-up MRCP was recommended.  He is still having some pain and bloating.  Diet is still not back to normal.  He does have some appetite.  He is doing his best to stay hydrated.  He is interested in seeing gastroenterology but would prefer to see .  Still having loose stools.  Past Medical History:  Diagnosis Date   Barrett's esophagus    BPH (benign prostatic hyperplasia)    Colon polyps    Complication of anesthesia    during biopsy, felt hot and sweating   Coronary artery disease    stent 4 2023   FH: factor V Leiden mutation    pt tested negatiove    niece died of leiden factor five complications, sister has   Frequent headaches    GERD (gastroesophageal reflux disease)    History of hiatal hernia    History of kidney stones    Hyperlipidemia    Hypertension    Migraines     no recent migraines had visual aura   Neuromuscular disorder (HCC)    neuropathy feet   Pneumonia    Prostate cancer (HCC)    Sleep apnea    no current issues no longer CPAP machine     Past Surgical History:  Procedure Laterality Date   colonscopy     CORONARY ANGIOGRAPHY N/A 09/08/2021   Procedure: CORONARY ANGIOGRAPHY;  Surgeon: Tonny Bollman, MD;  Location: [facility] INVASIVE  CV LAB;  Service: Cardiovascular;  Laterality: N/A;   CORONARY PRESSURE/FFR STUDY N/A 09/08/2021   Procedure: INTRAVASCULAR PRESSURE WIRE/FFR STUDY;  Surgeon: Tonny Bollman, MD;  Location: [facility] INVASIVE CV LAB;  Service: Cardiovascular;  Laterality: N/A;   CORONARY STENT INTERVENTION N/A 09/08/2021   Procedure: CORONARY STENT INTERVENTION;  Surgeon: Tonny Bollman, MD;  Location: [facility] INVASIVE CV LAB;  Service: Cardiovascular;  Laterality: N/A;   CYSTOSCOPY N/A 01/13/2020   Procedure: CYSTOSCOPY FLEXIBLE;  Surgeon: Crista Elliot, MD;  Location: [facility];  Service: Urology;  Laterality: N/A;  NO SEEDS FOUND IN BLADDER   CYSTOSCOPY/URETEROSCOPY/HOLMIUM LASER/STENT PLACEMENT Left 06/15/2022   Procedure: CYSTOSCOPY LEFT URETEROSCOPY/HOLMIUM LASER/STENT PLACEMENT;  Surgeon: Crista Elliot, MD;  Location: [facility];  Service: Urology;  Laterality: Left;  1 HR FOR CASE   ESOPHAGEAL MANOMETRY N/A 04/28/2022   Procedure: ESOPHAGEAL MANOMETRY (EM);  Surgeon: Charlott Rakes, MD;  Location: [facility];  Service: Gastroenterology;  Laterality: N/A;   EXTRACORPOREAL SHOCK WAVE LITHOTRIPSY Left 06/10/2022   Procedure: LEFT EXTRACORPOREAL SHOCK WAVE LITHOTRIPSY (ESWL);  Surgeon: Belva Agee, MD;  Location: [facility];  Service: Urology;  Laterality: Left;   EYE SURGERY     Lasik   MASS EXCISION Left 02/08/2021   Procedure: EXCISION MASS  OF MUCOID CYST WITH DISTAL INTERPHANGEAL JOINT ARTHROTOMY LEFT MIDDLE FINGER;  Surgeon: Betha Loa, MD;  Location: [facility];  Service: Orthopedics;  Laterality: Left;   NASAL ENDOSCOPY     PROSTATE BIOPSY  may 2021 and 2020   RADIOACTIVE SEED IMPLANT N/A 01/13/2020   Procedure: RADIOACTIVE SEED IMPLANT/BRACHYTHERAPY IMPLANT;  Surgeon: Crista Elliot, MD;  Location: [facility];  Service: Urology;  Laterality: N/A;    73 SEEDS IMPLANTED   SPACE OAR INSTILLATION N/A 01/13/2020   Procedure: SPACE  OAR INSTILLATION;  Surgeon: Crista Elliot, MD;  Location: [facility];  Service: Urology;  Laterality: N/A;   XI ROBOTIC ASSISTED HIATAL HERNIA REPAIR N/A 05/19/2022   Procedure: XI ROBOTIC ASSISTED REPAIR OF PARAESOPHAGEAL HIATAL HERNIA WITH FUNDOPLICATION;  Surgeon: Karie Soda, MD;  Location: [facility];  Service: General;  Laterality: N/A;    Medications  Current Outpatient Medications on File Prior to Visit  Medication Sig Dispense Refill   amLODipine (NORVASC) 5 MG tablet TAKE 1 TABLET (5 MG TOTAL) BY MOUTH DAILY. 100 tablet 0   aspirin EC 81 MG tablet Take 81 mg by mouth daily. Swallow whole.     cholecalciferol (VITAMIN D3) 25 MCG (1000 UT) tablet Take 1,000 Units by mouth daily.     diltiazem (CARDIZEM CD) 360 MG 24 hr capsule Take 1 capsule (360 mg total) by mouth daily. 100 capsule 3   ezetimibe (ZETIA) 10 MG tablet Take 1 tablet (10 mg total) by mouth daily. (Patient taking differently: Take 10 mg by mouth every evening.) 100 tablet 3   ipratropium (ATROVENT) 0.06 % nasal spray Place 2 sprays into both nostrils 3 (three) times daily as needed for rhinitis. 45 mL 3   lisinopril (ZESTRIL) 10 MG tablet Take 1 tablet (10 mg total) by mouth daily. 100 tablet 3   nitroGLYCERIN (NITROSTAT) 0.4 MG SL tablet Place 1 tablet (0.4 mg total) under the tongue every 5 (five) minutes as needed for chest pain. Do not use with tadalafil and sildenafil 25 tablet 2   rosuvastatin (CRESTOR) 20 MG tablet Take 1 tablet (20 mg total) by mouth daily. (Patient taking differently: Take 20 mg by mouth every evening.) 100 tablet 3   Vibegron (GEMTESA) 75 MG TABS Take 75 mg by mouth daily.     Allergies No Known Allergies  Family History Family History  Problem Relation Age of Onset   Arthritis Mother    Heart disease Father    Hyperlipidemia Father    Hypertension Father    Breast cancer Sister    Diabetes Maternal Grandmother    Hearing loss Paternal Grandmother    Early death  Paternal Grandmother    Hearing loss Paternal Grandfather    Early death Paternal Grandfather    Allergic rhinitis Neg Hx    Asthma Neg Hx    Eczema Neg Hx    Urticaria Neg Hx    Prostate cancer Neg Hx    Colon cancer Neg Hx    Pancreatic cancer Neg Hx     Review of Systems: Constitutional:  no fevers Eye:  no recent significant change in vision Ears:  No changes in hearing Nose/Mouth/Throat:  no complaints of nasal congestion, no sore throat Cardiovascular: no chest pain Respiratory:  No shortness of breath Gastrointestinal: As noted in HPI GU:  No frequency Integumentary:  no abnormal skin lesions reported Neurologic:  no headaches Endocrine:  denies unexplained weight changes  Exam BP 112/72 (BP  Location: Left Arm, Patient Position: Sitting, Cuff Size: Normal)   Pulse (!) 50   Temp 97.7 F (36.5 C) (Oral)   Ht 6\' 2"  (1.88 m)   Wt 195 lb 2 oz (88.5 kg)   SpO2 93%   BMI 25.05 kg/m  General:  well developed, well nourished, in no apparent distress Skin:  no significant moles, warts, or growths Head:  no masses, lesions, or tenderness Eyes:  pupils equal and round, sclera anicteric without injection Ears:  canals without lesions, TMs shiny without retraction, no obvious effusion, no erythema Nose:  nares patent, mucosa normal Throat/Pharynx:  lips and gingiva without lesion; tongue and uvula midline; non-inflamed pharynx; no exudates or postnasal drainage Lungs:  clear to auscultation, breath sounds equal bilaterally, no respiratory distress Cardio:  regular rhythm, bradycardic, no LE edema or bruits Rectal: Deferred GI: BS+, S, mild distention, mild TTP of the epigastric region, no masses or organomegaly Musculoskeletal:  symmetrical muscle groups noted without atrophy or deformity Neuro:  gait normal; deep tendon reflexes normal and symmetric Psych: well oriented with normal range of affect and appropriate judgment/insight  Assessment and Plan  Well adult  exam  Anemia, unspecified type  Acute pancreatitis without infection or necrosis, unspecified pancreatitis type - Plan: MR ABDOMEN WITH MRCP W CONTRAST, Ambulatory referral to Gastroenterology  Essential hypertension - Plan: CBC, Lipid panel   Well 72 y.o. male. Counseled on diet and exercise. Anemia: Follow-up today.  Could have been from acute illness. Refer to GI, check MRCP.  Stay hydrated. Recommended to go to the pharmacy for tetanus booster. Other orders as above. Follow up in 6 months.  The patient voiced understanding and agreement to the plan.  [name] [place]over, DO 02/10/23 2:16 PM

## 2023-02-10 NOTE — Patient Instructions (Addendum)
Give [place] 2-3 business days to get the results of your labs back.   Keep the diet clean and stay active.  Please consider updating your tetanus booster at the pharmacy.   I recommend getting the flu shot in mid October. This suggestion would change if the CDC comes out with a different recommendation.   Let us know if you need anything.

## 2023-02-11 LAB — LIPID PANEL
Cholesterol: 112 mg/dL (ref <200)
HDL: 37 mg/dL — ABNORMAL LOW (ref >=40)
LDL Cholesterol (Calc): 58 mg/dL
Non-HDL Cholesterol (Calc): 75 mg/dL (ref <130)
Total CHOL/HDL Ratio: 3 (calc) (ref <5.0)
Triglycerides: 85 mg/dL (ref <150)

## 2023-02-11 LAB — CBC
HCT: 42.9 % (ref 38.5–50.0)
Hemoglobin: 14.2 g/dL (ref 13.2–17.1)
MCH: 29.9 pg (ref 27.0–33.0)
MCHC: 33.1 g/dL (ref 32.0–36.0)
MCV: 90.3 fL (ref 80.0–100.0)
MPV: 10.2 fL (ref 7.5–12.5)
Platelets: 290 10*3/uL (ref 140–400)
RBC: 4.75 10*6/uL (ref 4.20–5.80)
RDW: 12.3 % (ref 11.0–15.0)
WBC: 6.2 10*3/uL (ref 3.8–10.8)

## 2023-02-14 ENCOUNTER — Other Ambulatory Visit: Payer: Self-pay | Admitting: Family Medicine

## 2023-02-14 ENCOUNTER — Encounter: Payer: Self-pay | Admitting: Family Medicine

## 2023-02-14 MED ORDER — CHOLESTYRAMINE 4 G PO PACK: 4.0000 g | PACK | Freq: Two times a day (BID) | ORAL | 0 refills | Status: DC

## 2023-02-14 NOTE — Addendum Note (Signed)
Addended by: Scharlene Gloss B on: 02/14/2023 08:04 AM   Modules accepted: Orders

## 2023-02-16 ENCOUNTER — Encounter: Payer: Self-pay | Admitting: Family Medicine

## 2023-02-16 ENCOUNTER — Other Ambulatory Visit: Payer: Self-pay | Admitting: Family Medicine

## 2023-02-16 ENCOUNTER — Ambulatory Visit (HOSPITAL_COMMUNITY)
Admission: RE | Admit: 2023-02-16 | Discharge: 2023-02-16 | Disposition: A | Discharge status: Home / Self Care | Payer: PPO | Source: Ambulatory Visit | Attending: Family Medicine | Admitting: Family Medicine

## 2023-02-16 ENCOUNTER — Telehealth: Payer: Self-pay | Admitting: Internal Medicine

## 2023-02-16 DIAGNOSIS — K859 Acute pancreatitis without necrosis or infection, unspecified: Secondary | ICD-10-CM

## 2023-02-16 DIAGNOSIS — K7689 Other specified diseases of liver: Secondary | ICD-10-CM | POA: Diagnosis not present

## 2023-02-16 DIAGNOSIS — K30 Functional dyspepsia: Secondary | ICD-10-CM | POA: Diagnosis not present

## 2023-02-16 DIAGNOSIS — K6389 Other specified diseases of intestine: Secondary | ICD-10-CM | POA: Diagnosis not present

## 2023-02-16 MED ORDER — GADOBUTROL 1 MMOL/ML IV SOLN
9.0000 mL | Freq: Once | INTRAVENOUS | Status: AC | PRN
  Administered 2023-02-16: 9 mL via INTRAVENOUS

## 2023-02-16 NOTE — Telephone Encounter (Signed)
Good morning Dr. Leonides Schanz  The following patient is being referred to [place] for acute pancreatitis. He has been with [facility] and no longer wishes to seek care with them because he was not able to ever see a doctor, they were short staffed and he felt he was not given the best care. Records are available in Epic under media. Please review and advise of scheduling. Thank you.

## 2023-02-16 NOTE — Telephone Encounter (Signed)
Reviewed records from [facility]. Okay to schedule for OV to discuss pancreatitis.  History of paraesophageal hiatal hernia s/p hiatal hernia surgery and Toupet fundoplication, prostate cancer s/p radiation. Tried hydrocortisone cream, mesalamine and hydrocortisone suppositories, and Proctofoam. Was considering flexible sigmoidoscopy to evaluate for radiation proctitis.  Colonoscopy 08/07/18: Good prep. 2 small transverse and descending colon tubular adenomas (one removed with forceps, one removed with hot snare), sigmoid diverticulosis, medium sized hemorrhoids. EGD 09/10/19: Barrett's esophagus 3 cm 36-39 cm from the incisors that was biopsied. Gastritis. Large hiatal hernia. Normal duodenum. Path: Barrett's mucosa. Negative for dysplasia.  EGD 10/31/22: 7 cm of Barrett's esophagus in the distal esophagus that was biopsied, ulcerated congested nodular mucosa in the antrum that was biopsied, large amount of food in stomach, normal duodenum. Path: Chronic inactive gastritis. Negative for H pylori. Barrett's mucosa negative for dysplasia.

## 2023-02-20 ENCOUNTER — Other Ambulatory Visit: Payer: Self-pay | Admitting: Family Medicine

## 2023-02-20 DIAGNOSIS — K859 Acute pancreatitis without necrosis or infection, unspecified: Secondary | ICD-10-CM

## 2023-02-22 ENCOUNTER — Encounter: Payer: Self-pay | Admitting: Internal Medicine

## 2023-02-22 NOTE — Telephone Encounter (Signed)
OV 10/1 @ 230

## 2023-02-28 ENCOUNTER — Encounter: Payer: Self-pay | Admitting: Internal Medicine

## 2023-02-28 ENCOUNTER — Other Ambulatory Visit (INDEPENDENT_AMBULATORY_CARE_PROVIDER_SITE_OTHER): Payer: PPO

## 2023-02-28 ENCOUNTER — Ambulatory Visit: Payer: PPO | Admitting: Internal Medicine

## 2023-02-28 ENCOUNTER — Telehealth: Payer: Self-pay | Admitting: Internal Medicine

## 2023-02-28 ENCOUNTER — Other Ambulatory Visit: Payer: Self-pay | Admitting: Internal Medicine

## 2023-02-28 VITALS — BP 118/60 | HR 74 | Ht 74.0 in | Wt 186.0 lb

## 2023-02-28 DIAGNOSIS — K567 Ileus, unspecified: Secondary | ICD-10-CM

## 2023-02-28 DIAGNOSIS — K8689 Other specified diseases of pancreas: Secondary | ICD-10-CM | POA: Diagnosis not present

## 2023-02-28 DIAGNOSIS — Z8719 Personal history of other diseases of the digestive system: Secondary | ICD-10-CM

## 2023-02-28 DIAGNOSIS — R197 Diarrhea, unspecified: Secondary | ICD-10-CM

## 2023-02-28 DIAGNOSIS — R5383 Other fatigue: Secondary | ICD-10-CM

## 2023-02-28 LAB — VITAMIN D 25 HYDROXY (VIT D DEFICIENCY, FRACTURES): VITD: 65.38 ng/mL (ref 30.00–100.00)

## 2023-02-28 LAB — LIPASE: Lipase: 62 U/L — ABNORMAL HIGH (ref 11.0–59.0)

## 2023-02-28 MED ORDER — PANCRELIPASE (LIP-PROT-AMYL) 36000-114000 UNITS PO CPEP: 36000.0000 [IU] | ORAL_CAPSULE | Freq: Three times a day (TID) | ORAL | 0 refills | Status: DC

## 2023-02-28 NOTE — Progress Notes (Signed)
Chief Complaint: Pancreatitis  HPI : 72 year old male with history of paraesophageal hiatal hernia s/p hiatal hernia surgery and Toupet fundoplication, prostate cancer s/p radiation, CAD s/p PCI, prior pancreatitis, and Barrett's esophagus presents with pancreatitis.   He was recently admitted at [facility] 9/5 to 02/05/2023 for ileus and pancreatitis, which developed while he was on a cruise on his way to [place].  Patient was treated with supportive care and temporary NG tube placement.   Since he was discharged, his upper abdominal pain has resolved.  However he has had recurrent issues of pain in the lower abdomen that has been worsening in severity.  When this pain occurs, he will feel distended in the upper abdomen.  He has not had solid bowel movements for about a month.  He on average has 10-20 bowel movements per day.  Diarrhea is particularly worse at night.  The stools can be watery or look like clear gel.  Denies blood in the stools.  When the cramping starts, he will also feel cramps in his ankles.  Over the course of a month he has lost about 12 pounds.  Endorses fatigue.  Denies nausea or vomiting.  Ever since his fundoplication surgery, he has not been able to have any vomiting episodes.  Endorses a poor appetite.  He has been eating light foods.  Denies fever or skin lesions.  Denies any new medications prior to onset of his pancreatitis.  Denies family history of pancreatitis in the past.  He has not had any alcohol since his episode of pancreatitis, but prior to his pancreatitis he would have 1 hard cider every 2 to 3 weeks.  Patient previously followed with [facility] for his GI care.   Past Medical History:  Diagnosis Date   Barrett's esophagus    BPH (benign prostatic hyperplasia)    Colon polyps    Complication of anesthesia    during biopsy, felt hot and sweating   Coronary artery disease    stent 4 2023   FH: factor V Leiden mutation    pt tested negatiove    niece died  of leiden factor five complications, sister has   Frequent headaches    GERD (gastroesophageal reflux disease)    History of hiatal hernia    History of kidney stones    Hyperlipidemia    Hypertension    Migraines     no recent migraines had visual aura   Neuromuscular disorder (HCC)    neuropathy feet   Pancreatitis    Pneumonia    Prostate cancer (HCC)    Sleep apnea    no current issues no longer CPAP machine     Past Surgical History:  Procedure Laterality Date   colonscopy     CORONARY ANGIOGRAPHY N/A 09/08/2021   Procedure: CORONARY ANGIOGRAPHY;  Surgeon: Tonny Bollman, MD;  Location: [facility] INVASIVE CV LAB;  Service: Cardiovascular;  Laterality: N/A;   CORONARY PRESSURE/FFR STUDY N/A 09/08/2021   Procedure: INTRAVASCULAR PRESSURE WIRE/FFR STUDY;  Surgeon: Tonny Bollman, MD;  Location: [facility] INVASIVE CV LAB;  Service: Cardiovascular;  Laterality: N/A;   CORONARY STENT INTERVENTION N/A 09/08/2021   Procedure: CORONARY STENT INTERVENTION;  Surgeon: Tonny Bollman, MD;  Location: [facility] INVASIVE CV LAB;  Service: Cardiovascular;  Laterality: N/A;   CYSTOSCOPY N/A 01/13/2020   Procedure: CYSTOSCOPY FLEXIBLE;  Surgeon: Crista Elliot, MD;  Location: [facility];  Service: Urology;  Laterality: N/A;  NO SEEDS FOUND IN BLADDER  CYSTOSCOPY/URETEROSCOPY/HOLMIUM LASER/STENT PLACEMENT Left 06/15/2022   Procedure: CYSTOSCOPY LEFT URETEROSCOPY/HOLMIUM LASER/STENT PLACEMENT;  Surgeon: Crista Elliot, MD;  Location: [facility];  Service: Urology;  Laterality: Left;  1 HR FOR CASE   ESOPHAGEAL MANOMETRY N/A 04/28/2022   Procedure: ESOPHAGEAL MANOMETRY (EM);  Surgeon: Charlott Rakes, MD;  Location: [facility];  Service: Gastroenterology;  Laterality: N/A;   EXTRACORPOREAL SHOCK WAVE LITHOTRIPSY Left 06/10/2022   Procedure: LEFT EXTRACORPOREAL SHOCK WAVE LITHOTRIPSY (ESWL);  Surgeon: Belva Agee, MD;  Location: [facility];  Service: Urology;  Laterality:  Left;   EYE SURGERY     Lasik   MASS EXCISION Left 02/08/2021   Procedure: EXCISION MASS OF MUCOID CYST WITH DISTAL INTERPHANGEAL JOINT ARTHROTOMY LEFT MIDDLE FINGER;  Surgeon: Betha Loa, MD;  Location: [facility];  Service: Orthopedics;  Laterality: Left;   NASAL ENDOSCOPY     PROSTATE BIOPSY  may 2021 and 2020   RADIOACTIVE SEED IMPLANT N/A 01/13/2020   Procedure: RADIOACTIVE SEED IMPLANT/BRACHYTHERAPY IMPLANT;  Surgeon: Crista Elliot, MD;  Location: [facility];  Service: Urology;  Laterality: N/A;    73 SEEDS IMPLANTED   SPACE OAR INSTILLATION N/A 01/13/2020   Procedure: SPACE OAR INSTILLATION;  Surgeon: Crista Elliot, MD;  Location: [facility];  Service: Urology;  Laterality: N/A;   XI ROBOTIC ASSISTED HIATAL HERNIA REPAIR N/A 05/19/2022   Procedure: XI ROBOTIC ASSISTED REPAIR OF PARAESOPHAGEAL HIATAL HERNIA WITH FUNDOPLICATION;  Surgeon: Karie Soda, MD;  Location: [facility];  Service: General;  Laterality: N/A;   Family History  Problem Relation Age of Onset   Arthritis Mother    Heart disease Father    Hyperlipidemia Father    Hypertension Father    Breast cancer Sister    Diabetes Maternal Grandmother    Hearing loss Paternal Grandmother    Early death Paternal Grandmother    Hearing loss Paternal Grandfather    Early death Paternal Grandfather    Allergic rhinitis Neg Hx    Asthma Neg Hx    Eczema Neg Hx    Urticaria Neg Hx    Prostate cancer Neg Hx    Colon cancer Neg Hx    Pancreatic cancer Neg Hx    Social History   Tobacco Use   Smoking status: Former    Current packs/day: 0.00    Average packs/day: 1 pack/day for 12.0 years (12.0 ttl pk-yrs)    Types: Cigarettes    Start date: 05/30/1966    Quit date: 05/30/1978    Years since quitting: 44.7   Smokeless tobacco: Never  Vaping Use   Vaping status: Never Used  Substance Use Topics   Alcohol use: Yes    Comment: rarely   Drug use: Not Currently    Current Outpatient Medications  Medication Sig Dispense Refill   aspirin EC 81 MG tablet Take 81 mg by mouth daily. Swallow whole.     cholecalciferol (VITAMIN D3) 25 MCG (1000 UT) tablet Take 1,000 Units by mouth daily.     diltiazem (CARDIZEM CD) 360 MG 24 hr capsule Take 1 capsule (360 mg total) by mouth daily. 100 capsule 3   ezetimibe (ZETIA) 10 MG tablet Take 1 tablet (10 mg total) by mouth daily. (Patient taking differently: Take 10 mg by mouth every evening.) 100 tablet 3   ipratropium (ATROVENT) 0.06 % nasal spray Place 2 sprays into both nostrils 3 (three) times daily as needed for rhinitis. 45 mL 3  lisinopril (ZESTRIL) 10 MG tablet Take 1 tablet (10 mg total) by mouth daily. 100 tablet 3   rosuvastatin (CRESTOR) 20 MG tablet Take 1 tablet (20 mg total) by mouth daily. (Patient taking differently: Take 20 mg by mouth every evening.) 100 tablet 3   Vibegron (GEMTESA) 75 MG TABS Take 75 mg by mouth daily.     amLODipine (NORVASC) 5 MG tablet TAKE 1 TABLET (5 MG TOTAL) BY MOUTH DAILY. 100 tablet 0   No current facility-administered medications for this visit.   No Known Allergies   Review of Systems: All systems reviewed and negative except where noted in HPI.   Physical Exam: BP 118/60   Pulse 74   Ht 6\' 2"  (1.88 m)   Wt 186 lb (84.4 kg)   BMI 23.88 kg/m  Constitutional: Pleasant,well-developed, male in no acute distress. HEENT: Normocephalic and atraumatic. Conjunctivae are normal. No scleral icterus. Cardiovascular: Normal rate, regular rhythm.  Pulmonary/chest: Effort normal and breath sounds normal. No wheezing, rales or rhonchi. Abdominal: Soft, nondistended, tender in the RUQ and RLQ. Bowel sounds active throughout. There are no masses palpable. No hepatomegaly. Extremities: No edema Neurological: Alert and oriented to person place and time. Skin: Skin is warm and dry. No rashes noted. Psychiatric: Normal mood and affect. Behavior is normal.  Labs 01/2023: CBC  normal.  BMP normal.  Phosphorus normal.  Ferritin normal at 299.  Iron saturation slightly low at 8%.  Vitamin B12 normal.  TSH normal.  Folate normal.  Stool norovirus was negative.  Lipase elevated 849.  LFTs normal.  Lipid panel was normal.  Barium esophagram 05/20/22: IMPRESSION: Expected postoperative appearance status post fundoplication. No demonstrated leak or other complication.  CT A/P w/o contrast 06/05/22: MPRESSION: 1. Moderate left hydroureteronephrosis secondary to a 7 x 7 mm calculus at the left ureterovesical junction. 2. Sigmoid colonic diverticulosis without evidence of acute diverticulitis. 3. Postsurgical changes about the GE junction suggesting recent hernia repair. 4. Additional chronic findings as above. 5. Aortic atherosclerosis.  CT A/P w/contrast 02/02/23: 1.  Inflammatory stranding in the region of the pancreatic head and adjacent proximal duodenum, with duodenal wall thickening. In the setting of high serum lipase, these findings likely represent duodenitis/pancreatitis.  2.  Mild distention of the small bowel loops, without definite transition point, extending just proximal to the ileocecal valve, likely secondary to ileus or slow transit, although early or partial small bowel obstruction cannot be excluded with certainty.  3.  Minimally prominent appendix without other primary or secondary signs of appendicitis, as discussed.  4.  Radiation pellets/fiducial markers in the region of the prostate, with adjacent circumferential rectal wall thickening, likely secondary to prior radiation treatment for prostate cancer.  5.  Additional adjacent pelvic fluid is nonspecific and age indeterminate, however may be secondary to some combination of the findings in impression #1, #2, and #4.  6.  Possible small side branch IPMN in the mid pancreas as described.   MRI abd/MRCP 02/16/23: IMPRESSION: Gastric distention and diffuse bowel dilatation throughout the abdomen. Pelvic  bowel loops are not visualized on this exam. Differential diagnosis includes bowel obstruction and ileus. Consider abdomen pelvis CT for further evaluation. No radiographic signs of acute pancreatitis. No evidence of biliary ductal dilatation or choledocholithiasis.  Colonoscopy 08/07/18: Good prep. 2 small transverse and descending colon tubular adenomas (one removed with forceps, one removed with hot snare), sigmoid diverticulosis, medium sized hemorrhoids.  EGD 09/10/19: Barrett's esophagus 3 cm 36-39 cm from the incisors that was biopsied.  Gastritis. Large hiatal hernia. Normal duodenum. Path: Barrett's mucosa. Negative for dysplasia.   Esophageal manometry 04/28/22: Normal and relaxation LES pressures. Intact peristalsis and complete bolus clearance on 80% of swallow. 4.6 cm hiatal hernia noted.   EGD 10/31/22: 7 cm of Barrett's esophagus in the distal esophagus that was biopsied, ulcerated congested nodular mucosa in the antrum that was biopsied, large amount of food in stomach, normal duodenum. Path: Chronic inactive gastritis. Negative for H pylori. Barrett's mucosa negative for dysplasia.  ASSESSMENT AND PLAN: Diarrhea Lower ab pain Pancreatic insufficiency History of pancreatitis Ileus Fatigue Barrett's esophagus History of colon polyps Patient presents with a recent bout of pancreatitis and ileus a month ago.  Unclear etiology at this time since patient does not drink alcohol and no gallstones were noted on imaging.  Patient also had a normal calcium level, normal triglyceride level, no new medications, and no clear signs of infection. Since his episode of pancreatitis, his upper abdominal pain has resolved, but the patient has continued to have diarrhea as well as lower abdominal pain, which may be related to pancreatic insufficiency following his episode of pancreatitis.  Will start the patient on some pancreatic enzyme supplements.  If this is not effective, then we will plan for  CT scan for further evaluation.  I encouraged that the patient follow a low fiber diet to try and help with any underlying concern for bowel obstruction.  Will check some labs today to evaluate for any further underlying inflammation and look for autoimmune etiologies of his pancreatitis.  Will also check a vitamin D level due to concerns about fatigue. -Check ANA, IgG4, vit D, lipase -Low fiber diet -Start Creon 36,000 U two capsules with meals and one with snacks. If ab pain is not improving in 1 week on Creon, patient will message me and we will plan for CT A/P w/contrast -Next EGD for Barrett's esophagus is due in 10/2025 -Next colonoscopy for polyp surveillance is due in 07/2025 -RTC 2 months  Eulah Pont, MD  I spent 61 minutes of time, including in depth chart review, independent review of results as outlined above, communicating results with the patient directly, face-to-face time with the patient, coordinating care, ordering studies and medications as appropriate, and documentation.

## 2023-02-28 NOTE — Telephone Encounter (Signed)
Inbound call from patient, states pharmacy states they have missing or unclear information for the order. Patient would like a nurse to call pharmacy and clarify.

## 2023-02-28 NOTE — Patient Instructions (Addendum)
Your provider has requested that you go to the basement level for lab work before leaving today. Press "B" on the elevator. The lab is located at the first door on the left as you exit the elevator.  We have sent the following medications to your pharmacy for you to pick up at your convenience: Creon  _______________________________________________________  If your blood pressure at your visit was 140/90 or greater, please contact your primary care physician to follow up on this.  _______________________________________________________  If you are age 35 or older, your body mass index should be between 23-30. Your Body mass index is 23.88 kg/m. If this is out of the aforementioned range listed, please consider follow up with your Primary Care Provider.  If you are age 70 or younger, your body mass index should be between 19-25. Your Body mass index is 23.88 kg/m. If this is out of the aformentioned range listed, please consider follow up with your Primary Care Provider.   ________________________________________________________  The [place] [facility] providers would like to encourage you to use [facility] to communicate with providers for non-urgent requests or questions.  Due to long hold times on the telephone, sending your provider a message by [facility] may be a faster and more efficient way to get a response.  Please allow 48 business hours for a response.  Please remember that this is for non-urgent requests.   Due to recent changes in healthcare laws, you may see the results of your imaging and laboratory studies on MyChart before your provider has had a chance to review them.  We understand that in some cases there may be results that are confusing or concerning to you. Not all laboratory results come back in the same time frame and the provider may be waiting for multiple results in order to interpret others.  Please give [place] 48 hours in order for your provider to thoroughly review all the results  before contacting the office for clarification of your results.   Thank you for entrusting me with your care and for choosing [facility], Dr. Eulah Pont

## 2023-03-01 ENCOUNTER — Other Ambulatory Visit: Payer: Self-pay

## 2023-03-01 LAB — IGG 4: IgG, Subclass 4: 234 mg/dL — ABNORMAL HIGH (ref 2–96)

## 2023-03-01 LAB — ANA: Anti Nuclear Antibody (ANA): NEGATIVE

## 2023-03-01 MED ORDER — PANCRELIPASE (LIP-PROT-AMYL) 36000-114000 UNITS PO CPEP: ORAL_CAPSULE | ORAL | 2 refills | Status: DC

## 2023-03-01 NOTE — Progress Notes (Signed)
Spoke to the patient regarding his labs.  His lipase was reassuring, having significantly decreased since his value in the 800s when he was diagnosed with acute pancreatitis last month.  Interestingly his IgG4 came back as elevated, potentially suggesting autoimmune pancreatitis.  I discussed his case with Dr. Meridee Score and he recommended a repeat IgG4 check in 1 month to see if it is still elevated.  If the IgG4 is still elevated, then would plan to proceed with endoscopic ultrasound.  We will go ahead and save him a slot for EUS in 03/2023 or 04/2023 depending on availability.  Patient states that he has still not been able to get his Creon due to insurance issues and how the prescription was written initially.  I recommended that he come into clinic to get a sample of Creon so that he can go ahead and get started on the medication.  Patient is open to this and will stop by.  I also went ahead and recommended a right upper quadrant ultrasound in order to definitively evaluate for any signs of gallstones.  If patient has gallstones, then would favor more likely gallstone pancreatitis rather than autoimmune pancreatitis.  Patient is also open to getting a right upper quadrant ultrasound.  Beth, please place an order for a right upper quadrant ultrasound and for a repeat IgG4 in 1 month.  Will CC Dr. Meridee Score as a Lorain Childes

## 2023-03-01 NOTE — Telephone Encounter (Signed)
Inbound call from patient follow up on patient message on note from 10/1. Would like to speak with a nurse in regards.

## 2023-03-02 ENCOUNTER — Other Ambulatory Visit: Payer: Self-pay

## 2023-03-02 DIAGNOSIS — R899 Unspecified abnormal finding in specimens from other organs, systems and tissues: Secondary | ICD-10-CM

## 2023-03-02 DIAGNOSIS — Z8719 Personal history of other diseases of the digestive system: Secondary | ICD-10-CM

## 2023-03-03 ENCOUNTER — Encounter: Payer: Self-pay | Admitting: Internal Medicine

## 2023-03-03 ENCOUNTER — Other Ambulatory Visit: Payer: Self-pay

## 2023-03-03 ENCOUNTER — Ambulatory Visit (HOSPITAL_COMMUNITY)
Admission: RE | Admit: 2023-03-03 | Discharge: 2023-03-03 | Disposition: A | Discharge status: Home / Self Care | Payer: PPO | Source: Ambulatory Visit | Attending: Internal Medicine | Admitting: Internal Medicine

## 2023-03-03 DIAGNOSIS — R899 Unspecified abnormal finding in specimens from other organs, systems and tissues: Secondary | ICD-10-CM | POA: Diagnosis not present

## 2023-03-03 DIAGNOSIS — Z8719 Personal history of other diseases of the digestive system: Secondary | ICD-10-CM | POA: Insufficient documentation

## 2023-03-03 DIAGNOSIS — K859 Acute pancreatitis without necrosis or infection, unspecified: Secondary | ICD-10-CM | POA: Diagnosis not present

## 2023-03-05 ENCOUNTER — Encounter: Payer: Self-pay | Admitting: Internal Medicine

## 2023-03-06 ENCOUNTER — Other Ambulatory Visit: Payer: Self-pay

## 2023-03-06 DIAGNOSIS — K8689 Other specified diseases of pancreas: Secondary | ICD-10-CM

## 2023-03-06 DIAGNOSIS — Z8719 Personal history of other diseases of the digestive system: Secondary | ICD-10-CM

## 2023-03-06 DIAGNOSIS — R197 Diarrhea, unspecified: Secondary | ICD-10-CM

## 2023-03-09 ENCOUNTER — Encounter: Payer: Self-pay | Admitting: Internal Medicine

## 2023-03-09 ENCOUNTER — Other Ambulatory Visit: Payer: Self-pay

## 2023-03-09 ENCOUNTER — Telehealth: Payer: Self-pay

## 2023-03-09 MED ORDER — PANCRELIPASE (LIP-PROT-AMYL) 36000-114000 UNITS PO CPEP: ORAL_CAPSULE | ORAL | 2 refills | Status: DC

## 2023-03-09 NOTE — Telephone Encounter (Signed)
The patient has sent [place] an update on his progress since starting Creon.   You asked that I provide an update after 1 week on Creon.  After increasing the dosage as instructed, the diarrhea stopped.  I've not experience any since then!  Stools are not yet normal, however.  Soft or very little.  Pain has subsided.  Last night was the best night since this started.  No pain, no gas and no diarrhea! Energy has returned and weight loss stopped.  I even gained a couple of pounds.  This leads me to a few questions. 1. Should I stop the low fiber, low residual diet to try to get back to a normal stool? 2. Do I still need to do the fecal tests? 3. Is there any issue with me getting my flu and covid vaccines?  This is the time of year I normally get them. 4. Do I need an updated prescription for Creon since the dosage changed?  I'm going to run out too soon with the current one.  I will update the prescription at the pharmacy to reflect the increase. Please advise on the other questions. Thanks

## 2023-03-10 ENCOUNTER — Other Ambulatory Visit: Payer: PPO

## 2023-03-10 DIAGNOSIS — K8689 Other specified diseases of pancreas: Secondary | ICD-10-CM

## 2023-03-10 DIAGNOSIS — Z8719 Personal history of other diseases of the digestive system: Secondary | ICD-10-CM

## 2023-03-10 DIAGNOSIS — R197 Diarrhea, unspecified: Secondary | ICD-10-CM

## 2023-03-14 ENCOUNTER — Other Ambulatory Visit: Payer: Self-pay

## 2023-03-14 ENCOUNTER — Other Ambulatory Visit: Payer: PPO

## 2023-03-14 DIAGNOSIS — R197 Diarrhea, unspecified: Secondary | ICD-10-CM

## 2023-03-14 DIAGNOSIS — R1084 Generalized abdominal pain: Secondary | ICD-10-CM

## 2023-03-14 DIAGNOSIS — K8689 Other specified diseases of pancreas: Secondary | ICD-10-CM

## 2023-03-15 ENCOUNTER — Ambulatory Visit (HOSPITAL_COMMUNITY)
Admission: RE | Admit: 2023-03-15 | Discharge: 2023-03-15 | Disposition: A | Discharge status: Home / Self Care | Payer: PPO | Source: Ambulatory Visit | Attending: Internal Medicine | Admitting: Internal Medicine

## 2023-03-15 ENCOUNTER — Other Ambulatory Visit: Payer: Self-pay

## 2023-03-15 ENCOUNTER — Other Ambulatory Visit: Payer: PPO

## 2023-03-15 DIAGNOSIS — K8689 Other specified diseases of pancreas: Secondary | ICD-10-CM | POA: Insufficient documentation

## 2023-03-15 DIAGNOSIS — R197 Diarrhea, unspecified: Secondary | ICD-10-CM

## 2023-03-15 DIAGNOSIS — R1084 Generalized abdominal pain: Secondary | ICD-10-CM

## 2023-03-15 DIAGNOSIS — R109 Unspecified abdominal pain: Secondary | ICD-10-CM | POA: Diagnosis not present

## 2023-03-15 DIAGNOSIS — K5732 Diverticulitis of large intestine without perforation or abscess without bleeding: Secondary | ICD-10-CM | POA: Diagnosis not present

## 2023-03-15 MED ORDER — IOHEXOL 350 MG/ML SOLN
75.0000 mL | Freq: Once | INTRAVENOUS | Status: AC | PRN
  Administered 2023-03-15: 75 mL via INTRAVENOUS

## 2023-03-16 LAB — CALPROTECTIN, FECAL: Calprotectin, Fecal: 71 ug/g (ref 0–120)

## 2023-03-17 ENCOUNTER — Encounter: Payer: Self-pay | Admitting: Internal Medicine

## 2023-03-18 LAB — GI PROFILE, STOOL, PCR

## 2023-03-18 LAB — PANCREATIC ELASTASE, FECAL: Pancreatic Elastase, Fecal: 448 ug Elast./g (ref >200)

## 2023-03-19 ENCOUNTER — Encounter: Payer: Self-pay | Admitting: Internal Medicine

## 2023-03-21 ENCOUNTER — Encounter: Payer: Self-pay | Admitting: Internal Medicine

## 2023-03-27 ENCOUNTER — Encounter: Payer: Self-pay | Admitting: Internal Medicine

## 2023-03-27 ENCOUNTER — Ambulatory Visit (AMBULATORY_SURGERY_CENTER): Payer: PPO

## 2023-03-27 VITALS — Ht 74.0 in | Wt 185.6 lb

## 2023-03-27 DIAGNOSIS — Z860100 Personal history of colon polyps, unspecified: Secondary | ICD-10-CM

## 2023-03-27 DIAGNOSIS — R197 Diarrhea, unspecified: Secondary | ICD-10-CM

## 2023-03-27 DIAGNOSIS — R1084 Generalized abdominal pain: Secondary | ICD-10-CM

## 2023-03-27 MED ORDER — PLENVU 140 G PO SOLR: 1.0000 | Freq: Once | ORAL | 0 refills | Status: AC

## 2023-03-27 NOTE — Progress Notes (Signed)
No egg or soy allergy known to patient  No issues known to pt with past sedation with any surgeries or procedures Patient denies ever being told they had issues or difficulty with intubation  No FH of Malignant Hyperthermia Pt is not on diet pills Pt is not on  home 02  Pt is not on blood thinners  Pt denies issues with constipation  No A fib or A flutter Have any cardiac testing pending--no  LOA: independent  Prep: Plenvu   Patient's chart reviewed by Cathlyn Parsons CNRA prior to previsit and patient appropriate for the [facility].  Previsit completed and red dot placed by patient's name on their procedure day (on provider's schedule).     PV competed with patient. Prep instructions sent via mychart and hard copy given at [facility].

## 2023-03-29 ENCOUNTER — Telehealth: Payer: Self-pay | Admitting: Internal Medicine

## 2023-03-29 ENCOUNTER — Encounter: Payer: PPO | Admitting: Internal Medicine

## 2023-03-29 DIAGNOSIS — K5732 Diverticulitis of large intestine without perforation or abscess without bleeding: Secondary | ICD-10-CM

## 2023-03-29 MED ORDER — AMOXICILLIN-POT CLAVULANATE 875-125 MG PO TABS: 1.0000 | ORAL_TABLET | Freq: Three times a day (TID) | ORAL | 0 refills | Status: DC

## 2023-03-29 NOTE — Telephone Encounter (Signed)
Spoke to the patient about the results of his CT scan, which showed sigmoid diverticulitis that was uncomplicated.  Will start him on Augmentin 875 mg 3 times daily for 10 days treatment of his diverticulitis, which I sent to his pharmacy.  Patient does continue to have some abdominal pain and diarrhea, though he has figured out a regimen of Tylenol and Gas-X that seems to control the pain fairly well.  Patient was originally scheduled for a colonoscopy on 11/4, though this will have to be delayed in the setting of his recent diagnosis of diverticulitis.  I would recommend that we reschedule his colonoscopy to 04/2023.  Patient states that he will drop off his IgG4 lab this Friday.  Tisha, please reschedule his colonoscopy in the [facility] to 04/2023.  He is planning to pick up his Plenvu prep so we can keep his colon preparation instructions the same, just change the dates.

## 2023-03-30 ENCOUNTER — Telehealth: Payer: Self-pay

## 2023-03-30 NOTE — Telephone Encounter (Signed)
Spoke to patient rescheduled colonoscopy procedure per provider request sent updated instructions to patient via MyChart.

## 2023-03-31 ENCOUNTER — Other Ambulatory Visit: Payer: PPO

## 2023-03-31 DIAGNOSIS — R899 Unspecified abnormal finding in specimens from other organs, systems and tissues: Secondary | ICD-10-CM | POA: Diagnosis not present

## 2023-03-31 DIAGNOSIS — Z8719 Personal history of other diseases of the digestive system: Secondary | ICD-10-CM | POA: Diagnosis not present

## 2023-03-31 NOTE — Telephone Encounter (Signed)
March 09, 2023 Imogene Burn, MD  to Evalee Jefferson, LPN     52/84/13  4:15 PM That is great news! Glad that he is feeling letter. Yes please go back to a normal diet. We can cancel the GI profile and fecal calprotectin, but I would still like to get the fecal elastase at some point to confirm his pancreatic insufficiency (no rush on this, and I suspect this will be abnormal with his robust response to Creon). There are no issues with him getting his COVID and flu vaccines.  Beth, thank you for updating his prescription.

## 2023-03-31 NOTE — Telephone Encounter (Signed)
This information has been addressed in subsequent phone encounters/mychart encounters.

## 2023-04-03 ENCOUNTER — Encounter: Payer: PPO | Admitting: Internal Medicine

## 2023-04-06 LAB — IGG 4: IgG, Subclass 4: 196 mg/dL — ABNORMAL HIGH (ref 2–96)

## 2023-04-11 ENCOUNTER — Encounter: Payer: Self-pay | Admitting: Cardiology

## 2023-04-11 ENCOUNTER — Encounter: Payer: Self-pay | Admitting: Internal Medicine

## 2023-04-11 ENCOUNTER — Ambulatory Visit: Payer: PPO | Attending: Cardiology | Admitting: Cardiology

## 2023-04-11 VITALS — BP 110/62 | HR 53 | Ht 74.0 in | Wt 192.0 lb

## 2023-04-11 DIAGNOSIS — R1084 Generalized abdominal pain: Secondary | ICD-10-CM

## 2023-04-11 DIAGNOSIS — K5732 Diverticulitis of large intestine without perforation or abscess without bleeding: Secondary | ICD-10-CM

## 2023-04-11 DIAGNOSIS — I2510 Atherosclerotic heart disease of native coronary artery without angina pectoris: Secondary | ICD-10-CM | POA: Diagnosis not present

## 2023-04-11 DIAGNOSIS — I10 Essential (primary) hypertension: Secondary | ICD-10-CM

## 2023-04-11 DIAGNOSIS — R55 Syncope and collapse: Secondary | ICD-10-CM | POA: Diagnosis not present

## 2023-04-11 DIAGNOSIS — E782 Mixed hyperlipidemia: Secondary | ICD-10-CM

## 2023-04-11 DIAGNOSIS — K567 Ileus, unspecified: Secondary | ICD-10-CM

## 2023-04-11 DIAGNOSIS — R5383 Other fatigue: Secondary | ICD-10-CM

## 2023-04-11 DIAGNOSIS — R197 Diarrhea, unspecified: Secondary | ICD-10-CM

## 2023-04-11 MED ORDER — LISINOPRIL 5 MG PO TABS: 5.0000 mg | ORAL_TABLET | Freq: Every day | ORAL | 3 refills | Status: DC

## 2023-04-11 MED ORDER — AMOXICILLIN-POT CLAVULANATE 875-125 MG PO TABS: 1.0000 | ORAL_TABLET | Freq: Three times a day (TID) | ORAL | 0 refills | Status: DC

## 2023-04-11 NOTE — Patient Instructions (Signed)
Medication Instructions:   STOP: Amlodipine   Lab Work: None Ordered If you have labs (blood work) drawn today and your tests are completely normal, you will receive your results only by: MyChart Message (if you have MyChart) OR A paper copy in the mail If you have any lab test that is abnormal or we need to change your treatment, we will call you to review the results.   Testing/Procedures: None Ordered   Follow-Up: At [facility], you and your health needs are our priority.  As part of our continuing mission to provide you with exceptional heart care, we have created designated Provider Care Teams.  These Care Teams include your primary Cardiologist (physician) and Advanced Practice Providers (APPs -  Physician Assistants and Nurse Practitioners) who all work together to provide you with the care you need, when you need it.  We recommend signing up for the patient portal called "MyChart".  Sign up information is provided on this After Visit Summary.  MyChart is used to connect with patients for Virtual Visits (Telemedicine).  Patients are able to view lab/test results, encounter notes, upcoming appointments, etc.  Non-urgent messages can be sent to your provider as well.   To learn more about what you can do with MyChart, go to [email or web address].    Your next appointment:   3 month(s)  The format for your next appointment:   In Person  Provider:   Gypsy Balsam, MD    Other Instructions NA

## 2023-04-11 NOTE — Progress Notes (Unsigned)
Cardiology Office Note:    Date:  04/11/2023   ID:  Dean Guzman, DOB 1951-02-17, MRN [id number]  PCP:  Sharlene Dory, DO  Cardiologist:  Gypsy Balsam, MD    Referring MD: Sharlene Dory*   Chief Complaint  Patient presents with   Follow-up    History of Present Illness:    Dean Guzman is a 72 y.o. male    with past medical history significant for factor V Leiden mutation, dyslipidemia, essential hypertension, initially he was referred to [place] because of episode of dizziness and near syncope, monitor was placed showed some ventricular tachycardia after that echocardiogram performed showing preserved ejection fraction, as a part of stratification of this arrhythmia he had stress test done which surprisingly showed old myocardial infarction with peri-infarct ischemia involving inferior wall.  After the cardiac catheterization was done and was find to have mid LAD lesion which was hemodynamically significant that was addressed with drug-eluting stent.  Comes today to months for follow-up.  Couple months ago he is on the cruise developed severe body aches were was airlifted in the [employer] he was found to have pancreatitis after that he end up having diverticulosis and diverticulitis he lost significant amount weight since that time.  Cardiac wise doing well walks on the regular basis have no difficulty no chest pain tightness squeezing pressure burning chest he still described to have some episode of dizziness that happen typically when he stands for a while.  He also noted his blood pressure being low  Past Medical History:  Diagnosis Date   Barrett's esophagus    BPH (benign prostatic hyperplasia)    Colon polyps    Complication of anesthesia    during biopsy, felt hot and sweating   Coronary artery disease    stent 4 2023   FH: factor V Leiden mutation    pt tested negatiove    niece died of leiden factor five complications, sister has    Frequent headaches    GERD (gastroesophageal reflux disease)    History of hiatal hernia    History of kidney stones    Hyperlipidemia    Hypertension    Migraines     no recent migraines had visual aura   Neuromuscular disorder (HCC)    neuropathy feet   Pancreatitis    Pneumonia    Prostate cancer (HCC)    Sleep apnea    no current issues no longer CPAP machine    Past Surgical History:  Procedure Laterality Date   colonscopy     CORONARY ANGIOGRAPHY N/A 09/08/2021   Procedure: CORONARY ANGIOGRAPHY;  Surgeon: Tonny Bollman, MD;  Location: [facility] INVASIVE CV LAB;  Service: Cardiovascular;  Laterality: N/A;   CORONARY PRESSURE/FFR STUDY N/A 09/08/2021   Procedure: INTRAVASCULAR PRESSURE WIRE/FFR STUDY;  Surgeon: Tonny Bollman, MD;  Location: [facility] INVASIVE CV LAB;  Service: Cardiovascular;  Laterality: N/A;   CORONARY STENT INTERVENTION N/A 09/08/2021   Procedure: CORONARY STENT INTERVENTION;  Surgeon: Tonny Bollman, MD;  Location: [facility] INVASIVE CV LAB;  Service: Cardiovascular;  Laterality: N/A;   CYSTOSCOPY N/A 01/13/2020   Procedure: CYSTOSCOPY FLEXIBLE;  Surgeon: Crista Elliot, MD;  Location: [facility];  Service: Urology;  Laterality: N/A;  NO SEEDS FOUND IN BLADDER   CYSTOSCOPY/URETEROSCOPY/HOLMIUM LASER/STENT PLACEMENT Left 06/15/2022   Procedure: CYSTOSCOPY LEFT URETEROSCOPY/HOLMIUM LASER/STENT PLACEMENT;  Surgeon: Crista Elliot, MD;  Location: [facility];  Service: Urology;  Laterality: Left;  1 HR FOR CASE  ESOPHAGEAL MANOMETRY N/A 04/28/2022   Procedure: ESOPHAGEAL MANOMETRY (EM);  Surgeon: Charlott Rakes, MD;  Location: [facility];  Service: Gastroenterology;  Laterality: N/A;   EXTRACORPOREAL SHOCK WAVE LITHOTRIPSY Left 06/10/2022   Procedure: LEFT EXTRACORPOREAL SHOCK WAVE LITHOTRIPSY (ESWL);  Surgeon: Belva Agee, MD;  Location: [facility];  Service: Urology;  Laterality: Left;   EYE SURGERY     Lasik   MASS EXCISION Left  02/08/2021   Procedure: EXCISION MASS OF MUCOID CYST WITH DISTAL INTERPHANGEAL JOINT ARTHROTOMY LEFT MIDDLE FINGER;  Surgeon: Betha Loa, MD;  Location: [facility];  Service: Orthopedics;  Laterality: Left;   NASAL ENDOSCOPY     PROSTATE BIOPSY  may 2021 and 2020   RADIOACTIVE SEED IMPLANT N/A 01/13/2020   Procedure: RADIOACTIVE SEED IMPLANT/BRACHYTHERAPY IMPLANT;  Surgeon: Crista Elliot, MD;  Location: [facility];  Service: Urology;  Laterality: N/A;    73 SEEDS IMPLANTED   SPACE OAR INSTILLATION N/A 01/13/2020   Procedure: SPACE OAR INSTILLATION;  Surgeon: Crista Elliot, MD;  Location: [facility];  Service: Urology;  Laterality: N/A;   XI ROBOTIC ASSISTED HIATAL HERNIA REPAIR N/A 05/19/2022   Procedure: XI ROBOTIC ASSISTED REPAIR OF PARAESOPHAGEAL HIATAL HERNIA WITH FUNDOPLICATION;  Surgeon: Karie Soda, MD;  Location: [facility];  Service: General;  Laterality: N/A;    Current Medications: Current Meds  Medication Sig   acetaminophen (TYLENOL) 325 MG tablet Take 325 mg by mouth every 6 (six) hours as needed for mild pain (pain score 1-3) or moderate pain (pain score 4-6).   amLODipine (NORVASC) 5 MG tablet TAKE 1 TABLET (5 MG TOTAL) BY MOUTH DAILY.   amoxicillin-clavulanate (AUGMENTIN) 875-125 MG tablet Take 1 tablet by mouth 3 (three) times daily.   aspirin EC 81 MG tablet Take 81 mg by mouth daily. Swallow whole.   cholecalciferol (VITAMIN D3) 25 MCG (1000 UT) tablet Take 1,000 Units by mouth daily.   diltiazem (CARDIZEM CD) 360 MG 24 hr capsule Take 1 capsule (360 mg total) by mouth daily.   ezetimibe (ZETIA) 10 MG tablet Take 1 tablet (10 mg total) by mouth daily. (Patient taking differently: Take 10 mg by mouth every evening.)   ipratropium (ATROVENT) 0.06 % nasal spray Place 2 sprays into both nostrils 3 (three) times daily as needed for rhinitis.   lisinopril (ZESTRIL) 10 MG tablet Take 1 tablet (10 mg total) by mouth daily.    ondansetron (ZOFRAN) 4 MG tablet Take 4 mg by mouth every 8 (eight) hours as needed for nausea or vomiting.   polyethylene glycol (MIRALAX / GLYCOLAX) 17 g packet Take 17 g by mouth daily as needed for mild constipation or moderate constipation.   rosuvastatin (CRESTOR) 20 MG tablet Take 1 tablet (20 mg total) by mouth daily. (Patient taking differently: Take 20 mg by mouth every evening.)   Vibegron (GEMTESA) 75 MG TABS Take 75 mg by mouth daily.   [DISCONTINUED] amoxicillin-clavulanate (AUGMENTIN) 875-125 MG tablet Take 1 tablet by mouth 3 (three) times daily.     Allergies:   Patient has no known allergies.   Social History   Socioeconomic History   Marital status: Married    Spouse name: Not on file   Number of children: 3   Years of education: Not on file   Highest education level: Not on file  Occupational History    Comment: full time  Tobacco Use   Smoking status: Former    Current packs/day: 0.00  Average packs/day: 1 pack/day for 12.0 years (12.0 ttl pk-yrs)    Types: Cigarettes    Start date: 05/30/1966    Quit date: 05/30/1978    Years since quitting: 44.8   Smokeless tobacco: Never  Vaping Use   Vaping status: Never Used  Substance and Sexual Activity   Alcohol use: Yes    Comment: rarely   Drug use: Not Currently   Sexual activity: Not Currently  Other Topics Concern   Not on file  Social History Narrative   Not on file   Social Determinants of Health   Financial Resource Strain: Low Risk  (07/11/2022)   Overall Financial Resource Strain (CARDIA)    Difficulty of Paying Living Expenses: Not hard at all  Food Insecurity: No Food Insecurity (02/07/2023)   Hunger Vital Sign    Worried About Running Out of Food in the Last Year: Never true    Ran Out of Food in the Last Year: Never true  Transportation Needs: No Transportation Needs (07/11/2022)   PRAPARE - Administrator, Civil Service (Medical): No    Lack of Transportation (Non-Medical): No   Physical Activity: Sufficiently Active (07/11/2022)   Exercise Vital Sign    Days of Exercise per Week: 5 days    Minutes of Exercise per Session: 40 min  Stress: No Stress Concern Present (07/11/2022)   [employer] of Occupational Health - Occupational Stress Questionnaire    Feeling of Stress : Not at all  Social Connections: Socially Isolated (07/11/2022)   Social Connection and Isolation Panel [NHANES]    Frequency of Communication with Friends and Family: Once a week    Frequency of Social Gatherings with Friends and Family: Once a week    Attends Religious Services: Never    Database administrator or Organizations: No    Attends Engineer, structural: Never    Marital Status: Married     Family History: The patient's family history includes Arthritis in Barret's mother; Breast cancer in Shriyans's sister; Diabetes in Lavin's maternal grandmother; Early death in Ellie's paternal grandfather and paternal grandmother; Hearing loss in Hoang's paternal grandfather and paternal grandmother; Heart disease in Niccolo's father; Hyperlipidemia in Pasco's father; Hypertension in Paolo's father. There is no history of Allergic rhinitis, Asthma, Eczema, Urticaria, Prostate cancer, Colon cancer, or Pancreatic cancer. ROS:   Please see the history of present illness.    All 14 point review of systems negative except as described per history of present illness  EKGs/Labs/Other Studies Reviewed:    EKG Interpretation Date/Time:  Tuesday April 11 2023 14:16:42 EST Ventricular Rate:  53 PR Interval:  150 QRS Duration:  108 QT Interval:  436 QTC Calculation: 409 R Axis:   -20  Text Interpretation: Normal sinus rhythm When compared with ECG of 17-Jan-2022 10:37, Sinus rhythm is now Confirmed by Gypsy Balsam [phone]) on 04/11/2023 2:29:01 PM    Recent Labs: 07/01/2022: BUN 14; Creatinine, Ser 1.14; Potassium 5.1; Sodium 138 08/01/2022: ALT 16 02/10/2023: Hemoglobin 14.2; Platelets  290  Recent Lipid Panel    Component Value Date/Time   CHOL 112 02/10/2023 1352   CHOL 112 08/01/2022 1057   TRIG 85 02/10/2023 1352   HDL 37 (L) 02/10/2023 1352   HDL 45 08/01/2022 1057   CHOLHDL 3.0 02/10/2023 1352   VLDL 22.0 06/29/2021 1107   LDLCALC 58 02/10/2023 1352    Physical Exam:    VS:  BP 110/62 (BP Location: Left Arm, Patient Position: Sitting)  Pulse (!) 53   Ht 6\' 2"  (1.88 m)   Wt 192 lb (87.1 kg)   SpO2 99%   BMI 24.65 kg/m     Wt Readings from Last 3 Encounters:  04/11/23 192 lb (87.1 kg)  03/27/23 185 lb 9.6 oz (84.2 kg)  02/28/23 186 lb (84.4 kg)     GEN:  Well nourished, well developed in no acute distress HEENT: Normal NECK: No JVD; No carotid bruits LYMPHATICS: No lymphadenopathy CARDIAC: RRR, no murmurs, no rubs, no gallops RESPIRATORY:  Clear to auscultation without rales, wheezing or rhonchi  ABDOMEN: Soft, non-tender, non-distended MUSCULOSKELETAL:  No edema; No deformity  SKIN: Warm and dry LOWER EXTREMITIES: no swelling NEUROLOGIC:  Alert and oriented x 3 PSYCHIATRIC:  Normal affect   ASSESSMENT:    1. Essential hypertension   2. Coronary artery disease involving native coronary artery of native heart without angina pectoris   3. Near syncope   4. Mixed hyperlipidemia    PLAN:    In order of problems listed above:  Essential hypertension his blood pressure is low I will discontinue his amlodipine I think the reason for low blood pressure and the fact that he lost significant amount of weight. Coronary disease stable from that point review on appropriate guideline directed medical therapy which I will continue. Near syncope plan as described above.  Monitoring so far unrevealing. Mixed dyslipidemia I did review K PN which show me data from September of this year LDL 58 HDL 37 continue present management   Medication Adjustments/Labs and Tests Ordered: Current medicines are reviewed at length with the patient today.  Concerns  regarding medicines are outlined above.  Orders Placed This Encounter  Procedures   EKG 12-Lead   Medication changes: No orders of the defined types were placed in this encounter.   Signed, Georgeanna Lea, MD, [facility] 04/11/2023 2:46 PM    [facility]

## 2023-04-11 NOTE — Addendum Note (Signed)
Addended by: Loretha Stapler on: 04/11/2023 10:41 AM   Modules accepted: Orders

## 2023-04-19 ENCOUNTER — Ambulatory Visit (HOSPITAL_COMMUNITY)
Admission: RE | Admit: 2023-04-19 | Discharge: 2023-04-19 | Disposition: A | Discharge status: Home / Self Care | Payer: PPO | Source: Ambulatory Visit | Attending: Internal Medicine | Admitting: Internal Medicine

## 2023-04-19 DIAGNOSIS — K5732 Diverticulitis of large intestine without perforation or abscess without bleeding: Secondary | ICD-10-CM | POA: Diagnosis not present

## 2023-04-19 DIAGNOSIS — K567 Ileus, unspecified: Secondary | ICD-10-CM | POA: Diagnosis not present

## 2023-04-19 DIAGNOSIS — R1084 Generalized abdominal pain: Secondary | ICD-10-CM | POA: Insufficient documentation

## 2023-04-19 DIAGNOSIS — R109 Unspecified abdominal pain: Secondary | ICD-10-CM | POA: Diagnosis not present

## 2023-04-19 DIAGNOSIS — R197 Diarrhea, unspecified: Secondary | ICD-10-CM | POA: Diagnosis not present

## 2023-04-19 DIAGNOSIS — R5383 Other fatigue: Secondary | ICD-10-CM | POA: Diagnosis not present

## 2023-04-19 MED ORDER — IOHEXOL 300 MG/ML  SOLN
100.0000 mL | Freq: Once | INTRAMUSCULAR | Status: AC | PRN
  Administered 2023-04-19: 100 mL via INTRAVENOUS

## 2023-04-19 MED ORDER — IOHEXOL 300 MG/ML  SOLN
30.0000 mL | Freq: Once | INTRAMUSCULAR | Status: AC | PRN
  Administered 2023-04-19: 30 mL via ORAL

## 2023-04-24 ENCOUNTER — Encounter: Payer: Self-pay | Admitting: Internal Medicine

## 2023-04-25 ENCOUNTER — Other Ambulatory Visit: Payer: Self-pay

## 2023-04-25 MED ORDER — COLESTIPOL HCL 1 G PO TABS: 2.0000 g | ORAL_TABLET | Freq: Every day | ORAL | 1 refills | Status: DC

## 2023-04-28 ENCOUNTER — Telehealth: Payer: Self-pay | Admitting: Gastroenterology

## 2023-04-28 NOTE — Telephone Encounter (Signed)
Received a call from the patient's wife.  Spoke with her on 11/28, Thanksgiving day around 11 AM.  Patient has a history of pancreatitis with associated ileus and then has been evaluated more recently by Dr. Leonides Schanz for lower abdominal pain, diarrhea, etc.  Apparently patient has continue to have ongoing diarrhea.  They called though because overnight he was having pain throughout his abdomen.  He felt like his abdomen was bloated and distended and he felt like he could not pass gas.  Some nausea, but no vomiting.  They wanted to know what he could do for the pain.  Patient did not sound in any significant distress over the phone at the time of my call.  They did have Zofran for nausea at home and oxycodone at home.  Advised that if he has recurrent pancreatitis or an ileus then he would need to back off on his diet to at least clear liquids for the next day or 2.  He can try Gas-X throughout the day.  Can use Tylenol.  Okay to use some oxycodone, but will use sparingly especially if this is the setting of recurrent ileus.  Obviously if symptoms progress he was advised to be evaluated in the emergency department.  CT scan just 3 days prior on 11/25 looked okay.

## 2023-05-03 ENCOUNTER — Encounter: Payer: Self-pay | Admitting: Internal Medicine

## 2023-05-03 ENCOUNTER — Ambulatory Visit: Payer: PPO | Admitting: Internal Medicine

## 2023-05-03 VITALS — BP 141/63 | HR 48 | Temp 97.7°F | Resp 10 | Ht 74.0 in | Wt 185.0 lb

## 2023-05-03 DIAGNOSIS — Z860101 Personal history of adenomatous and serrated colon polyps: Secondary | ICD-10-CM

## 2023-05-03 DIAGNOSIS — R197 Diarrhea, unspecified: Secondary | ICD-10-CM | POA: Diagnosis not present

## 2023-05-03 DIAGNOSIS — K648 Other hemorrhoids: Secondary | ICD-10-CM

## 2023-05-03 DIAGNOSIS — K5730 Diverticulosis of large intestine without perforation or abscess without bleeding: Secondary | ICD-10-CM

## 2023-05-03 DIAGNOSIS — I10 Essential (primary) hypertension: Secondary | ICD-10-CM | POA: Diagnosis not present

## 2023-05-03 DIAGNOSIS — Z860100 Personal history of colon polyps, unspecified: Secondary | ICD-10-CM

## 2023-05-03 DIAGNOSIS — I2510 Atherosclerotic heart disease of native coronary artery without angina pectoris: Secondary | ICD-10-CM | POA: Diagnosis not present

## 2023-05-03 DIAGNOSIS — K6389 Other specified diseases of intestine: Secondary | ICD-10-CM | POA: Diagnosis not present

## 2023-05-03 DIAGNOSIS — Z8719 Personal history of other diseases of the digestive system: Secondary | ICD-10-CM | POA: Diagnosis not present

## 2023-05-03 DIAGNOSIS — E785 Hyperlipidemia, unspecified: Secondary | ICD-10-CM | POA: Diagnosis not present

## 2023-05-03 MED ORDER — DICYCLOMINE HCL 10 MG PO CAPS: 10.0000 mg | ORAL_CAPSULE | Freq: Three times a day (TID) | ORAL | 0 refills | Status: DC | PRN

## 2023-05-03 MED ORDER — SODIUM CHLORIDE 0.9 % IV SOLN: 500.0000 mL | Freq: Once | INTRAVENOUS | Status: DC

## 2023-05-03 NOTE — Progress Notes (Signed)
GASTROENTEROLOGY PROCEDURE H&P NOTE   Primary Care Physician: Sharlene Dory, DO    Reason for Procedure:   Colonoscopy  Plan:    Diarrhea, history of diverticulitis  Patient is appropriate for endoscopic procedure(s) in the ambulatory ([facility]) setting.  The nature of the procedure, as well as the risks, benefits, and alternatives were carefully and thoroughly reviewed with the patient. Ample time for discussion and questions allowed. The patient understood, was satisfied, and agreed to proceed.     HPI: Dean Guzman is a 72 y.o. male who presents for colonoscopy for evaluation of diarrhea, history of diverticulitis.  Patient was most recently seen in the Gastroenterology Clinic on 02/28/23.  No interval change in medical history since that appointment. Please refer to that note for full details regarding GI history and clinical presentation.   Past Medical History:  Diagnosis Date   Barrett's esophagus    BPH (benign prostatic hyperplasia)    Colon polyps    Complication of anesthesia    during biopsy, felt hot and sweating   Coronary artery disease    stent 4 2023   FH: factor V Leiden mutation    pt tested negatiove    niece died of leiden factor five complications, sister has   Frequent headaches    GERD (gastroesophageal reflux disease)    History of hiatal hernia    History of kidney stones    Hyperlipidemia    Hypertension    Migraines     no recent migraines had visual aura   Neuromuscular disorder (HCC)    neuropathy feet   Pancreatitis    Pneumonia    Prostate cancer (HCC)    Sleep apnea    no current issues no longer CPAP machine    Past Surgical History:  Procedure Laterality Date   colonscopy     CORONARY ANGIOGRAPHY N/A 09/08/2021   Procedure: CORONARY ANGIOGRAPHY;  Surgeon: Tonny Bollman, MD;  Location: [facility] INVASIVE CV LAB;  Service: Cardiovascular;  Laterality: N/A;   CORONARY PRESSURE/FFR STUDY N/A 09/08/2021   Procedure:  INTRAVASCULAR PRESSURE WIRE/FFR STUDY;  Surgeon: Tonny Bollman, MD;  Location: [facility] INVASIVE CV LAB;  Service: Cardiovascular;  Laterality: N/A;   CORONARY STENT INTERVENTION N/A 09/08/2021   Procedure: CORONARY STENT INTERVENTION;  Surgeon: Tonny Bollman, MD;  Location: [facility] INVASIVE CV LAB;  Service: Cardiovascular;  Laterality: N/A;   CYSTOSCOPY N/A 01/13/2020   Procedure: CYSTOSCOPY FLEXIBLE;  Surgeon: Crista Elliot, MD;  Location: [facility];  Service: Urology;  Laterality: N/A;  NO SEEDS FOUND IN BLADDER   CYSTOSCOPY/URETEROSCOPY/HOLMIUM LASER/STENT PLACEMENT Left 06/15/2022   Procedure: CYSTOSCOPY LEFT URETEROSCOPY/HOLMIUM LASER/STENT PLACEMENT;  Surgeon: Crista Elliot, MD;  Location: [facility];  Service: Urology;  Laterality: Left;  1 HR FOR CASE   ESOPHAGEAL MANOMETRY N/A 04/28/2022   Procedure: ESOPHAGEAL MANOMETRY (EM);  Surgeon: Charlott Rakes, MD;  Location: [facility];  Service: Gastroenterology;  Laterality: N/A;   EXTRACORPOREAL SHOCK WAVE LITHOTRIPSY Left 06/10/2022   Procedure: LEFT EXTRACORPOREAL SHOCK WAVE LITHOTRIPSY (ESWL);  Surgeon: Belva Agee, MD;  Location: [facility];  Service: Urology;  Laterality: Left;   EYE SURGERY     Lasik   MASS EXCISION Left 02/08/2021   Procedure: EXCISION MASS OF MUCOID CYST WITH DISTAL INTERPHANGEAL JOINT ARTHROTOMY LEFT MIDDLE FINGER;  Surgeon: Betha Loa, MD;  Location: [facility];  Service: Orthopedics;  Laterality: Left;   NASAL ENDOSCOPY     PROSTATE BIOPSY  may  2021 and 2020   RADIOACTIVE SEED IMPLANT N/A 01/13/2020   Procedure: RADIOACTIVE SEED IMPLANT/BRACHYTHERAPY IMPLANT;  Surgeon: Crista Elliot, MD;  Location: [facility];  Service: Urology;  Laterality: N/A;    73 SEEDS IMPLANTED   SPACE OAR INSTILLATION N/A 01/13/2020   Procedure: SPACE OAR INSTILLATION;  Surgeon: Crista Elliot, MD;  Location: [facility];  Service: Urology;   Laterality: N/A;   XI ROBOTIC ASSISTED HIATAL HERNIA REPAIR N/A 05/19/2022   Procedure: XI ROBOTIC ASSISTED REPAIR OF PARAESOPHAGEAL HIATAL HERNIA WITH FUNDOPLICATION;  Surgeon: Karie Soda, MD;  Location: [facility];  Service: General;  Laterality: N/A;    Prior to Admission medications   Medication Sig Start Date End Date Taking? Authorizing Provider  acetaminophen (TYLENOL) 325 MG tablet Take 325 mg by mouth every 6 (six) hours as needed for mild pain (pain score 1-3) or moderate pain (pain score 4-6). 02/05/23  Yes [provider]  aspirin EC 81 MG tablet Take 81 mg by mouth daily. Swallow whole.   Yes [provider]  cholecalciferol (VITAMIN D3) 25 MCG (1000 UT) tablet Take 1,000 Units by mouth daily.   Yes [provider]  colestipol (COLESTID) 1 g tablet Take 2 tablets (2 g total) by mouth daily. 04/25/23  Yes Imogene Burn, MD  diltiazem (CARDIZEM CD) 360 MG 24 hr capsule Take 1 capsule (360 mg total) by mouth daily. 08/31/22  Yes Georgeanna Lea, MD  ezetimibe (ZETIA) 10 MG tablet Take 1 tablet (10 mg total) by mouth daily. Patient taking differently: Take 10 mg by mouth every evening. 06/09/22  Yes Georgeanna Lea, MD  ipratropium (ATROVENT) 0.06 % nasal spray Place 2 sprays into both nostrils 3 (three) times daily as needed for rhinitis. 07/18/22  Yes Sharlene Dory, DO  lisinopril (ZESTRIL) 5 MG tablet Take 1 tablet (5 mg total) by mouth daily. 04/11/23 07/10/23 Yes Georgeanna Lea, MD  ondansetron (ZOFRAN) 4 MG tablet Take 4 mg by mouth every 8 (eight) hours as needed for nausea or vomiting. 10/07/22  Yes [provider]  rosuvastatin (CRESTOR) 20 MG tablet Take 1 tablet (20 mg total) by mouth daily. Patient taking differently: Take 20 mg by mouth every evening. 06/09/22  Yes Georgeanna Lea, MD  Vibegron (GEMTESA) 75 MG TABS Take 75 mg by mouth daily.   Yes [provider]  polyethylene glycol (MIRALAX / GLYCOLAX) 17 g  packet Take 17 g by mouth daily as needed for mild constipation or moderate constipation. 02/05/23   [provider]    Current Outpatient Medications  Medication Sig Dispense Refill   acetaminophen (TYLENOL) 325 MG tablet Take 325 mg by mouth every 6 (six) hours as needed for mild pain (pain score 1-3) or moderate pain (pain score 4-6).     aspirin EC 81 MG tablet Take 81 mg by mouth daily. Swallow whole.     cholecalciferol (VITAMIN D3) 25 MCG (1000 UT) tablet Take 1,000 Units by mouth daily.     colestipol (COLESTID) 1 g tablet Take 2 tablets (2 g total) by mouth daily. 60 tablet 1   diltiazem (CARDIZEM CD) 360 MG 24 hr capsule Take 1 capsule (360 mg total) by mouth daily. 100 capsule 3   ezetimibe (ZETIA) 10 MG tablet Take 1 tablet (10 mg total) by mouth daily. (Patient taking differently: Take 10 mg by mouth every evening.) 100 tablet 3   ipratropium (ATROVENT) 0.06 % nasal spray Place  2 sprays into both nostrils 3 (three) times daily as needed for rhinitis. 45 mL 3   lisinopril (ZESTRIL) 5 MG tablet Take 1 tablet (5 mg total) by mouth daily. 90 tablet 3   ondansetron (ZOFRAN) 4 MG tablet Take 4 mg by mouth every 8 (eight) hours as needed for nausea or vomiting.     rosuvastatin (CRESTOR) 20 MG tablet Take 1 tablet (20 mg total) by mouth daily. (Patient taking differently: Take 20 mg by mouth every evening.) 100 tablet 3   Vibegron (GEMTESA) 75 MG TABS Take 75 mg by mouth daily.     polyethylene glycol (MIRALAX / GLYCOLAX) 17 g packet Take 17 g by mouth daily as needed for mild constipation or moderate constipation.     Current Facility-Administered Medications  Medication Dose Route Frequency Provider Last Rate Last Admin   0.9 %  sodium chloride infusion  500 mL Intravenous Once Imogene Burn, MD        Allergies as of 05/03/2023   (No Known Allergies)    Family History  Problem Relation Age of Onset   Arthritis Mother    Heart disease Father    Hyperlipidemia Father     Hypertension Father    Breast cancer Sister    Diabetes Maternal Grandmother    Hearing loss Paternal Grandmother    Early death Paternal Grandmother    Hearing loss Paternal Grandfather    Early death Paternal Grandfather    Allergic rhinitis Neg Hx    Asthma Neg Hx    Eczema Neg Hx    Urticaria Neg Hx    Prostate cancer Neg Hx    Colon cancer Neg Hx    Pancreatic cancer Neg Hx    Esophageal cancer Neg Hx     Social History   Socioeconomic History   Marital status: Married    Spouse name: Not on file   Number of children: 3   Years of education: Not on file   Highest education level: Not on file  Occupational History    Comment: full time  Tobacco Use   Smoking status: Former    Current packs/day: 0.00    Average packs/day: 1 pack/day for 12.0 years (12.0 ttl pk-yrs)    Types: Cigarettes    Start date: 05/30/1966    Quit date: 05/30/1978    Years since quitting: 44.9   Smokeless tobacco: Never  Vaping Use   Vaping status: Never Used  Substance and Sexual Activity   Alcohol use: Yes    Comment: rarely   Drug use: Not Currently   Sexual activity: Not Currently  Other Topics Concern   Not on file  Social History Narrative   Not on file   Social Determinants of Health   Financial Resource Strain: Low Risk  (07/11/2022)   Overall Financial Resource Strain (CARDIA)    Difficulty of Paying Living Expenses: Not hard at all  Food Insecurity: No Food Insecurity (02/07/2023)   Hunger Vital Sign    Worried About Running Out of Food in the Last Year: Never true    Ran Out of Food in the Last Year: Never true  Transportation Needs: No Transportation Needs (07/11/2022)   PRAPARE - Administrator, Civil Service (Medical): No    Lack of Transportation (Non-Medical): No  Physical Activity: Sufficiently Active (07/11/2022)   Exercise Vital Sign    Days of Exercise per Week: 5 days    Minutes of Exercise per Session: 40 min  Stress: No Stress Concern Present  (07/11/2022)   [employer] of Occupational Health - Occupational Stress Questionnaire    Feeling of Stress : Not at all  Social Connections: Socially Isolated (07/11/2022)   Social Connection and Isolation Panel [NHANES]    Frequency of Communication with Friends and Family: Once a week    Frequency of Social Gatherings with Friends and Family: Once a week    Attends Religious Services: Never    Database administrator or Organizations: No    Attends Banker Meetings: Never    Marital Status: Married  Catering manager Violence: Not At Risk (07/18/2022)   Humiliation, Afraid, Rape, and Kick questionnaire    Fear of Current or Ex-Partner: No    Emotionally Abused: No    Physically Abused: No    Sexually Abused: No    Physical Exam: Vital signs in last 24 hours: BP (!) 148/76   Pulse 61   Temp 97.7 F (36.5 C)   Ht 6\' 2"  (1.88 m)   Wt 185 lb (83.9 kg)   SpO2 99%   BMI 23.75 kg/m  GEN: NAD EYE: Sclerae anicteric ENT: MMM CV: Non-tachycardic Pulm: No increased WOB GI: Soft NEURO:  Alert & Oriented   Eulah Pont, MD [facility]   05/03/2023 12:53 PM

## 2023-05-03 NOTE — Progress Notes (Signed)
Called to room to assist during endoscopic procedure.  Patient ID and intended procedure confirmed with present staff. Received instructions for my participation in the procedure from the performing physician.  

## 2023-05-03 NOTE — Patient Instructions (Signed)
Discharge instructions given. Biopsies taken. Handouts on Diverticulosis and Hemorrhoids. Prescription sent to pharmacy. YOU HAD AN ENDOSCOPIC PROCEDURE TODAY AT THE [facility]:   Refer to the procedure report that was given to you for any specific questions about what was found during the examination.  If the procedure report does not answer your questions, please call your gastroenterologist to clarify.  If you requested that your care partner not be given the details of your procedure findings, then the procedure report has been included in a sealed envelope for you to review at your convenience later.  YOU SHOULD EXPECT: Some feelings of bloating in the abdomen. Passage of more gas than usual.  Walking can help get rid of the air that was put into your GI tract during the procedure and reduce the bloating. If you had a lower endoscopy (such as a colonoscopy or flexible sigmoidoscopy) you may notice spotting of blood in your stool or on the toilet paper. If you underwent a bowel prep for your procedure, you may not have a normal bowel movement for a few days.  Please Note:  You might notice some irritation and congestion in your nose or some drainage.  This is from the oxygen used during your procedure.  There is no need for concern and it should clear up in a day or so.  SYMPTOMS TO REPORT IMMEDIATELY:  Following lower endoscopy (colonoscopy or flexible sigmoidoscopy):  Excessive amounts of blood in the stool  Significant tenderness or worsening of abdominal pains  Swelling of the abdomen that is new, acute  Fever of 100F or higher   For urgent or emergent issues, a gastroenterologist can be reached at any hour by calling [phone]. Do not use MyChart messaging for urgent concerns.    DIET:  We do recommend a small meal at first, but then you may proceed to your regular diet.  Drink plenty of fluids but you should avoid alcoholic beverages for 24 hours.  ACTIVITY:   You should plan to take it easy for the rest of today and you should NOT DRIVE or use heavy machinery until tomorrow (because of the sedation medicines used during the test).    FOLLOW UP: Our staff will call the number listed on your records the next business day following your procedure.  We will call around 7:15- 8:00 am to check on you and address any questions or concerns that you may have regarding the information given to you following your procedure. If we do not reach you, we will leave a message.     If any biopsies were taken you will be contacted by phone or by letter within the next 1-3 weeks.  Please call us at [phone] if you have not heard about the biopsies in 3 weeks.    SIGNATURES/CONFIDENTIALITY: You and/or your care partner have signed paperwork which will be entered into your electronic medical record.  These signatures attest to the fact that that the information above on your After Visit Summary has been reviewed and is understood.  Full responsibility of the confidentiality of this discharge information lies with you and/or your care-partner.

## 2023-05-03 NOTE — Op Note (Addendum)
[facility] Patient Name: Dean Guzman Procedure Date: 05/03/2023 1:28 PM MRN: [id number] Endoscopist: Madelyn Brunner Rinard , , [id number] Age: 72 Referring MD:  Date of Birth: 1950/10/04 Gender: Male Account #: [id number] Procedure:                Colonoscopy Indications:              Diarrhea, Follow-up of diverticulitis Medicines:                Monitored Anesthesia Care Procedure:                Pre-Anesthesia Assessment:                           - Prior to the procedure, a History and Physical                            was performed, and patient medications and                            allergies were reviewed. The patient's tolerance of                            previous anesthesia was also reviewed. The risks                            and benefits of the procedure and the sedation                            options and risks were discussed with the patient.                            All questions were answered, and informed consent                            was obtained. Prior Anticoagulants: The patient has                            taken no anticoagulant or antiplatelet agents. ASA                            Grade Assessment: II - A patient with mild systemic                            disease. After reviewing the risks and benefits,                            the patient was deemed in satisfactory condition to                            undergo the procedure.                           After obtaining informed consent, the colonoscope  was passed under direct vision. Throughout the                            procedure, the patient's blood pressure, pulse, and                            oxygen saturations were monitored continuously. The                            CF [id number] #[id number] was introduced through the anus                            and advanced to the the terminal ileum. The                            colonoscopy was  performed without difficulty. The                            patient tolerated the procedure well. The quality                            of the bowel preparation was good. The terminal                            ileum, ileocecal valve, appendiceal orifice, and                            rectum were photographed. Scope In: 1:34:51 PM Scope Out: 1:59:25 PM Scope Withdrawal Time: 0 hours 15 minutes 20 seconds  Total Procedure Duration: 0 hours 24 minutes 34 seconds  Findings:                 The terminal ileum appeared normal. Was able to                            exam at least 10 cm of the terminal ileum.                           Multiple diverticula were found in the sigmoid                            colon, descending colon and ascending colon.                           Biopsies were taken with a cold forceps in the                            entire colon for histology.                           Non-bleeding internal hemorrhoids were found during                            retroflexion. Complications:  No immediate complications. Estimated Blood Loss:     Estimated blood loss was minimal. Impression:               - The examined portion of the ileum was normal.                           - Diverticulosis in the sigmoid colon, in the                            descending colon and in the ascending colon.                           - Non-bleeding internal hemorrhoids.                           - Biopsies were taken with a cold forceps for                            histology in the entire colon. Recommendation:           - Discharge patient to home (with escort).                           - Await pathology results.                           - Will presribe Bentyl 10 mg TID PRN for abdominal                            pain. Try not to use too much since this can                            sometimes slow down the bowels.                           - The findings and  recommendations were discussed                            with the patient. Dr Particia Lather [address] [place],  05/03/2023 2:06:44 PM

## 2023-05-03 NOTE — Progress Notes (Signed)
Pt's states no medical or surgical changes since previsit or office visit. 

## 2023-05-03 NOTE — Progress Notes (Signed)
Sedate, gd SR, tolerated procedure well, VSS, report to RN 

## 2023-05-04 ENCOUNTER — Telehealth: Payer: Self-pay

## 2023-05-04 NOTE — Telephone Encounter (Signed)
  Follow up Call-     05/03/2023   12:43 PM  Call back number  Post procedure Call Back phone  # [phone]  Permission to leave phone message Yes     Patient questions:  Do you have a fever, pain , or abdominal swelling? No. Pain Score  0 *  Have you tolerated food without any problems? Yes.    Have you been able to return to your normal activities? Yes.    Do you have any questions about your discharge instructions: Diet   No. Medications  No. Follow up visit  No.  Do you have questions or concerns about your Care? No.  Actions: * If pain score is 4 or above: No action needed, pain <4.

## 2023-05-09 ENCOUNTER — Encounter (HOSPITAL_COMMUNITY): Payer: Self-pay | Admitting: Gastroenterology

## 2023-05-10 LAB — SURGICAL PATHOLOGY

## 2023-05-11 ENCOUNTER — Encounter: Payer: Self-pay | Admitting: Internal Medicine

## 2023-05-11 ENCOUNTER — Ambulatory Visit: Payer: PPO | Admitting: Internal Medicine

## 2023-05-11 ENCOUNTER — Other Ambulatory Visit: Payer: PPO

## 2023-05-11 VITALS — BP 130/72 | HR 57 | Ht 74.0 in | Wt 192.6 lb

## 2023-05-11 DIAGNOSIS — R1013 Epigastric pain: Secondary | ICD-10-CM

## 2023-05-11 DIAGNOSIS — Z860100 Personal history of colon polyps, unspecified: Secondary | ICD-10-CM | POA: Diagnosis not present

## 2023-05-11 DIAGNOSIS — R197 Diarrhea, unspecified: Secondary | ICD-10-CM

## 2023-05-11 DIAGNOSIS — K2270 Barrett's esophagus without dysplasia: Secondary | ICD-10-CM | POA: Diagnosis not present

## 2023-05-11 DIAGNOSIS — R768 Other specified abnormal immunological findings in serum: Secondary | ICD-10-CM | POA: Diagnosis not present

## 2023-05-11 DIAGNOSIS — R1030 Lower abdominal pain, unspecified: Secondary | ICD-10-CM

## 2023-05-11 DIAGNOSIS — Z8719 Personal history of other diseases of the digestive system: Secondary | ICD-10-CM

## 2023-05-11 DIAGNOSIS — K580 Irritable bowel syndrome with diarrhea: Secondary | ICD-10-CM

## 2023-05-11 DIAGNOSIS — R1084 Generalized abdominal pain: Secondary | ICD-10-CM | POA: Diagnosis not present

## 2023-05-11 LAB — CBC WITH DIFFERENTIAL/PLATELET
Basophils Absolute: 0 10*3/uL (ref 0.0–0.1)
Basophils Relative: 0.7 % (ref 0.0–3.0)
Eosinophils Absolute: 0.2 10*3/uL (ref 0.0–0.7)
Eosinophils Relative: 3.4 % (ref 0.0–5.0)
HCT: 41.8 % (ref 39.0–52.0)
Hemoglobin: 14 g/dL (ref 13.0–17.0)
Lymphocytes Relative: 17 % (ref 12.0–46.0)
Lymphs Abs: 1.1 10*3/uL (ref 0.7–4.0)
MCHC: 33.5 g/dL (ref 30.0–36.0)
MCV: 90.1 fL (ref 78.0–100.0)
Monocytes Absolute: 0.5 10*3/uL (ref 0.1–1.0)
Monocytes Relative: 8 % (ref 3.0–12.0)
Neutro Abs: 4.4 10*3/uL (ref 1.4–7.7)
Neutrophils Relative %: 70.9 % (ref 43.0–77.0)
Platelets: 181 10*3/uL (ref 150.0–400.0)
RBC: 4.63 Mil/uL (ref 4.22–5.81)
RDW: 14.6 % (ref 11.5–15.5)
WBC: 6.2 10*3/uL (ref 4.0–10.5)

## 2023-05-11 LAB — C-REACTIVE PROTEIN: CRP: 1.6 mg/dL (ref 0.5–20.0)

## 2023-05-11 LAB — COMPREHENSIVE METABOLIC PANEL
ALT: 16 U/L (ref 0–53)
AST: 16 U/L (ref 0–37)
Albumin: 4.2 g/dL (ref 3.5–5.2)
Alkaline Phosphatase: 69 U/L (ref 39–117)
BUN: 16 mg/dL (ref 6–23)
CO2: 32 meq/L (ref 19–32)
Calcium: 9.8 mg/dL (ref 8.4–10.5)
Chloride: 100 meq/L (ref 96–112)
Creatinine, Ser: 0.95 mg/dL (ref 0.40–1.50)
GFR: 79.88 mL/min (ref >60.00)
Glucose, Bld: 123 mg/dL — ABNORMAL HIGH (ref 70–99)
Potassium: 4.2 meq/L (ref 3.5–5.1)
Sodium: 138 meq/L (ref 135–145)
Total Bilirubin: 0.5 mg/dL (ref 0.2–1.2)
Total Protein: 7 g/dL (ref 6.0–8.3)

## 2023-05-11 LAB — LIPASE: Lipase: 44 U/L (ref 11.0–59.0)

## 2023-05-11 MED ORDER — RIFAXIMIN 550 MG PO TABS: 550.0000 mg | ORAL_TABLET | Freq: Three times a day (TID) | ORAL | 0 refills | Status: AC

## 2023-05-11 NOTE — Patient Instructions (Addendum)
Your provider has requested that you go to the basement level for lab work before leaving today. Press "B" on the elevator. The lab is located at the first door on the left as you exit the elevator.  We have sent the following medications to your pharmacy for you to pick up at your convenience: Rifaximin   Okay to use Imodium 2 mg four time daily or as needed   You are scheduled for a follow up visit on 07/04/23 10:30 am with Dr Leonides Schanz  If your blood pressure at your visit was 140/90 or greater, please contact your primary care physician to follow up on this.  _______________________________________________________  If you are age 86 or older, your body mass index should be between 23-30. Your Body mass index is 24.73 kg/m. If this is out of the aforementioned range listed, please consider follow up with your Primary Care Provider.  If you are age 22 or younger, your body mass index should be between 19-25. Your Body mass index is 24.73 kg/m. If this is out of the aformentioned range listed, please consider follow up with your Primary Care Provider.   ________________________________________________________  The [facility] providers would like to encourage you to use [facility] to communicate with providers for non-urgent requests or questions.  Due to long hold times on the telephone, sending your provider a message by [employer] may be a faster and more efficient way to get a response.  Please allow 48 business hours for a response.  Please remember that this is for non-urgent requests.  _______________________________________________________  Thank you for entrusting me with your care and for choosing [facility],  Dr. Eulah Pont

## 2023-05-11 NOTE — Progress Notes (Signed)
Chief Complaint: Abdominal pain, diarrhea  HPI : 72 year old male with history of paraesophageal hiatal hernia s/p hiatal hernia surgery and Toupet fundoplication, prostate cancer s/p radiation, CAD s/p PCI, prior pancreatitis, and Barrett's esophagus presents for follow up of abdominal pain and diarrhea  He was admitted at [facility] 9/5 to 02/05/2023 for ileus and pancreatitis, which developed while he was on a cruise on his way to [place].  Patient was treated with supportive care and temporary NG tube placement.   Interval History: Two nights ago he woke up in excruciating pain. He took one Bentyl and then 15 min later and the pain started to subside.  He does think that the Bentyl seems to be helping to control his abdominal pain.  He is taking the Bentyl usually about once every 2 to 3 days.  Ab pain mostly occurs in the epigastric area and lower abdomen. He is still having diarrhea all night so he is not sleeping well. He is still having 10-20 stools per day on average. Colestipol was not helping at all.  In the past Creon did help temporarily but this effect seemed to wane over time.  He is planning to go on a family trip in 04/2023 but he thinks it would be impossible to do so with having so many stools. When he was on Augmentin for diverticulitis, he had no diarrhea at all.  Wt Readings from Last 3 Encounters:  05/11/23 192 lb 9.6 oz (87.4 kg)  05/03/23 185 lb (83.9 kg)  04/11/23 192 lb (87.1 kg)   Past Medical History:  Diagnosis Date   Barrett's esophagus    BPH (benign prostatic hyperplasia)    Colon polyps    Complication of anesthesia    during biopsy, felt hot and sweating   Coronary artery disease    stent 4 2023   FH: factor V Leiden mutation    pt tested negatiove    niece died of leiden factor five complications, sister has   Frequent headaches    GERD (gastroesophageal reflux disease)    History of hiatal hernia    History of kidney stones    Hyperlipidemia     Hypertension    Migraines     no recent migraines had visual aura   Neuromuscular disorder (HCC)    neuropathy feet   Pancreatitis    Pneumonia    Prostate cancer (HCC)    Sleep apnea    no current issues no longer CPAP machine     Past Surgical History:  Procedure Laterality Date   colonscopy     CORONARY ANGIOGRAPHY N/A 09/08/2021   Procedure: CORONARY ANGIOGRAPHY;  Surgeon: Tonny Bollman, MD;  Location: [facility] INVASIVE CV LAB;  Service: Cardiovascular;  Laterality: N/A;   CORONARY PRESSURE/FFR STUDY N/A 09/08/2021   Procedure: INTRAVASCULAR PRESSURE WIRE/FFR STUDY;  Surgeon: Tonny Bollman, MD;  Location: [facility] INVASIVE CV LAB;  Service: Cardiovascular;  Laterality: N/A;   CORONARY STENT INTERVENTION N/A 09/08/2021   Procedure: CORONARY STENT INTERVENTION;  Surgeon: Tonny Bollman, MD;  Location: [facility] INVASIVE CV LAB;  Service: Cardiovascular;  Laterality: N/A;   CYSTOSCOPY N/A 01/13/2020   Procedure: CYSTOSCOPY FLEXIBLE;  Surgeon: Crista Elliot, MD;  Location: [facility];  Service: Urology;  Laterality: N/A;  NO SEEDS FOUND IN BLADDER   CYSTOSCOPY/URETEROSCOPY/HOLMIUM LASER/STENT PLACEMENT Left 06/15/2022   Procedure: CYSTOSCOPY LEFT URETEROSCOPY/HOLMIUM LASER/STENT PLACEMENT;  Surgeon: Crista Elliot, MD;  Location: [facility];  Service: Urology;  Laterality:  Left;  1 HR FOR CASE   ESOPHAGEAL MANOMETRY N/A 04/28/2022   Procedure: ESOPHAGEAL MANOMETRY (EM);  Surgeon: Charlott Rakes, MD;  Location: [facility];  Service: Gastroenterology;  Laterality: N/A;   EXTRACORPOREAL SHOCK WAVE LITHOTRIPSY Left 06/10/2022   Procedure: LEFT EXTRACORPOREAL SHOCK WAVE LITHOTRIPSY (ESWL);  Surgeon: Belva Agee, MD;  Location: [facility];  Service: Urology;  Laterality: Left;   EYE SURGERY     Lasik   MASS EXCISION Left 02/08/2021   Procedure: EXCISION MASS OF MUCOID CYST WITH DISTAL INTERPHANGEAL JOINT ARTHROTOMY LEFT MIDDLE FINGER;  Surgeon: Betha Loa,  MD;  Location: [facility];  Service: Orthopedics;  Laterality: Left;   NASAL ENDOSCOPY     PROSTATE BIOPSY  may 2021 and 2020   RADIOACTIVE SEED IMPLANT N/A 01/13/2020   Procedure: RADIOACTIVE SEED IMPLANT/BRACHYTHERAPY IMPLANT;  Surgeon: Crista Elliot, MD;  Location: [facility];  Service: Urology;  Laterality: N/A;    73 SEEDS IMPLANTED   SPACE OAR INSTILLATION N/A 01/13/2020   Procedure: SPACE OAR INSTILLATION;  Surgeon: Crista Elliot, MD;  Location: [facility];  Service: Urology;  Laterality: N/A;   XI ROBOTIC ASSISTED HIATAL HERNIA REPAIR N/A 05/19/2022   Procedure: XI ROBOTIC ASSISTED REPAIR OF PARAESOPHAGEAL HIATAL HERNIA WITH FUNDOPLICATION;  Surgeon: Karie Soda, MD;  Location: [facility];  Service: General;  Laterality: N/A;   Family History  Problem Relation Age of Onset   Arthritis Mother    Heart disease Father    Hyperlipidemia Father    Hypertension Father    Breast cancer Sister    Diabetes Maternal Grandmother    Hearing loss Paternal Grandmother    Early death Paternal Grandmother    Hearing loss Paternal Grandfather    Early death Paternal Grandfather    Allergic rhinitis Neg Hx    Asthma Neg Hx    Eczema Neg Hx    Urticaria Neg Hx    Prostate cancer Neg Hx    Colon cancer Neg Hx    Pancreatic cancer Neg Hx    Esophageal cancer Neg Hx    Social History   Tobacco Use   Smoking status: Former    Current packs/day: 0.00    Average packs/day: 1 pack/day for 12.0 years (12.0 ttl pk-yrs)    Types: Cigarettes    Start date: 05/30/1966    Quit date: 05/30/1978    Years since quitting: 44.9   Smokeless tobacco: Never  Vaping Use   Vaping status: Never Used  Substance Use Topics   Alcohol use: Yes    Comment: rarely   Drug use: Not Currently   Current Outpatient Medications  Medication Sig Dispense Refill   acetaminophen (TYLENOL) 325 MG tablet Take 325 mg by mouth every 6 (six) hours as needed for mild pain  (pain score 1-3) or moderate pain (pain score 4-6).     aspirin EC 81 MG tablet Take 81 mg by mouth daily. Swallow whole.     cholecalciferol (VITAMIN D3) 25 MCG (1000 UT) tablet Take 1,000 Units by mouth daily.     dicyclomine (BENTYL) 10 MG capsule Take 1 capsule (10 mg total) by mouth 3 (three) times daily as needed for spasms. 90 capsule 0   diltiazem (CARDIZEM CD) 360 MG 24 hr capsule Take 1 capsule (360 mg total) by mouth daily. 100 capsule 3   ezetimibe (ZETIA) 10 MG tablet Take 1 tablet (10 mg total) by mouth daily. (Patient taking differently:  Take 10 mg by mouth every evening.) 100 tablet 3   ipratropium (ATROVENT) 0.06 % nasal spray Place 2 sprays into both nostrils 3 (three) times daily as needed for rhinitis. 45 mL 3   lisinopril (ZESTRIL) 5 MG tablet Take 1 tablet (5 mg total) by mouth daily. 90 tablet 3   ondansetron (ZOFRAN) 4 MG tablet Take 4 mg by mouth every 8 (eight) hours as needed for nausea or vomiting.     rosuvastatin (CRESTOR) 20 MG tablet Take 1 tablet (20 mg total) by mouth daily. (Patient taking differently: Take 20 mg by mouth every evening.) 100 tablet 3   Vibegron (GEMTESA) 75 MG TABS Take 75 mg by mouth daily.     No current facility-administered medications for this visit.   No Known Allergies  Physical Exam: BP 130/72   Pulse (!) 57   Ht 6\' 2"  (1.88 m)   Wt 192 lb 9.6 oz (87.4 kg)   SpO2 93%   BMI 24.73 kg/m  Constitutional: Pleasant,well-developed, male in no acute distress. HEENT: Normocephalic and atraumatic. Conjunctivae are normal. No scleral icterus. Cardiovascular: Normal rate, regular rhythm.  Pulmonary/chest: Effort normal and breath sounds normal. No wheezing, rales or rhonchi. Abdominal: Soft, nondistended, tender in the epigastric area and RLQ. Bowel sounds active throughout. There are no masses palpable. No hepatomegaly. Extremities: No edema Neurological: Alert and oriented to person place and time. Skin: Skin is warm and dry. No rashes  noted. Psychiatric: Normal mood and affect. Behavior is normal.  Labs 01/2023: CBC normal.  BMP normal.  Phosphorus normal.  Ferritin normal at 299.  Iron saturation slightly low at 8%.  Vitamin B12 normal.  TSH normal.  Folate normal.  Stool norovirus was negative.  Lipase elevated 849.  LFTs normal.  Lipid panel was normal.  Labs 02/2023: IgG4 elevated at 234. Lipase mildly elevated at 62. ANA negative. Vit D nml. GI profile negative. Pancreatic fecal elastase normal. Fecal calprotectin borderline at 71.  Labs 03/2023: IgG4 elevated at 196  Barium esophagram 05/20/22: IMPRESSION: Expected postoperative appearance status post fundoplication. No demonstrated leak or other complication.  CT A/P w/o contrast 06/05/22: MPRESSION: 1. Moderate left hydroureteronephrosis secondary to a 7 x 7 mm calculus at the left ureterovesical junction. 2. Sigmoid colonic diverticulosis without evidence of acute diverticulitis. 3. Postsurgical changes about the GE junction suggesting recent hernia repair. 4. Additional chronic findings as above. 5. Aortic atherosclerosis.  CT A/P w/contrast 02/02/23: 1.  Inflammatory stranding in the region of the pancreatic head and adjacent proximal duodenum, with duodenal wall thickening. In the setting of high serum lipase, these findings likely represent duodenitis/pancreatitis.  2.  Mild distention of the small bowel loops, without definite transition point, extending just proximal to the ileocecal valve, likely secondary to ileus or slow transit, although early or partial small bowel obstruction cannot be excluded with certainty.  3.  Minimally prominent appendix without other primary or secondary signs of appendicitis, as discussed.  4.  Radiation pellets/fiducial markers in the region of the prostate, with adjacent circumferential rectal wall thickening, likely secondary to prior radiation treatment for prostate cancer.  5.  Additional adjacent pelvic fluid is  nonspecific and age indeterminate, however may be secondary to some combination of the findings in impression #1, #2, and #4.  6.  Possible small side branch IPMN in the mid pancreas as described.   MRI abd/MRCP 02/16/23: IMPRESSION: Gastric distention and diffuse bowel dilatation throughout the abdomen. Pelvic bowel loops are not visualized on this exam.  Differential diagnosis includes bowel obstruction and ileus. Consider abdomen pelvis CT for further evaluation. No radiographic signs of acute pancreatitis. No evidence of biliary ductal dilatation or choledocholithiasis.  RUQ U/S 03/03/23: IMPRESSION: No cholelithiasis or sonographic evidence for acute cholecystitis.  CT A/P w/contrast 03/15/23: IMPRESSION: 1. Acute uncomplicated sigmoid diverticulitis. 2. Moderately distended stomach. 3. Aortic atherosclerosis (ICD10-I70.0).  CT A/P w/contrast 04/19/23: IMPRESSION: Resolution of sigmoid diverticulitis since prior study. No evidence of complication or other acute findings  Colonoscopy 08/07/18: Good prep. 2 small transverse and descending colon tubular adenomas (one removed with forceps, one removed with hot snare), sigmoid diverticulosis, medium sized hemorrhoids.  EGD 09/10/19: Barrett's esophagus 3 cm 36-39 cm from the incisors that was biopsied. Gastritis. Large hiatal hernia. Normal duodenum. Path: Barrett's mucosa. Negative for dysplasia.   Esophageal manometry 04/28/22: Normal and relaxation LES pressures. Intact peristalsis and complete bolus clearance on 80% of swallow. 4.6 cm hiatal hernia noted.   EGD 10/31/22: 7 cm of Barrett's esophagus in the distal esophagus that was biopsied, ulcerated congested nodular mucosa in the antrum that was biopsied, large amount of food in stomach, normal duodenum. Path: Chronic inactive gastritis. Negative for H pylori. Barrett's mucosa negative for dysplasia.  Colonoscopy 05/03/23: - The examined portion of the ileum was normal. -  Diverticulosis in the sigmoid colon, in the descending colon and in the ascending colon. - Non- bleeding internal hemorrhoids. - Biopsies were taken with a cold forceps for histology in the entire colon. Path: 1. Surgical [P], random colon bxs :       COLONIC MUCOSA WITH MILD EDEMA.       NO ACTIVE INFLAMMATION, CHRONIC CHANGES, GRANULOMAS ARE OTHER PATHOLOGIC       FEATURES.       SEE NOTE.       Diagnosis Note : The differential diagnosis includes resolving infectious       colitis but there are no specific features to allow a definitive diagnosis.       Dr. Kenard Gower reviewed this case and agrees.   ASSESSMENT AND PLAN: Diarrhea Epigastric and lower ab pain Elevated serum IgG4 History of pancreatitis History of ileus Barrett's esophagus History of colon polyps Patient presents for follow-up after an initial episode of pancreatitis and ileus in 01/2023.  Ever since that episode, patient has continued to have issues with diarrhea and abdominal pain.  Workup has been relatively unremarkable with the exception of an elevated serum IgG4 that could suggest underlying IgG4 disease and diverticulitis that was seen on a CT scan in 02/2023.  His subsequent CT scan showed that his diverticulitis had resolved.  I performed a colonoscopy on the patient recently with biopsies coming back with edema suggestive of resolving infection but no clear signs of inflammation.  I have asked our pathology colleagues to add on IgG4 stain to see if this could confirm a diagnosis of IgG4 disease.  Patient is also scheduled for an upcoming upper EUS with Dr. Meridee Score to likely obtain pancreas biopsies to evaluate for IgG4 disease as well.  His diarrhea and abdominal pain have been difficult to control.  Bentyl seems to recently be helping with his abdominal pain issues.  However, he continues to have profound diarrhea along with nocturnal stools.  He has not been responsive to colestipol or Creon in the past.  Pancreatic  insufficiency was ruled out based upon a normal fecal elastase.  I had been hesitant to start him on antidiarrheal medication due to his history of ileus.  However  due to significance of his symptoms we will have him start some Imodium to see if this helps with his symptoms.  Interestingly while the patient was on Augmentin therapy for treatment of diverticulitis, patient had no issues with diarrhea.  Thus will prescribe him a course of rifaximin to help him through an upcoming family trip.  Will also update some of his labs today. -Check CBC, CMP, lipase, CRP, TTG IgA, IgA, IgG4 -Okay to use Imodium 2 mg QID PRN. Will not go above 8 mg per day for now. -Cont Bentyl 10 mg TID PRN -Rifaximin 550 mg TID for 14 days. If not approved, then will prescribe Augmentin -Contacted pathology to get colon biopsies stained for IgG4. They stated that they would send samples out for staining. -Upper EUS is scheduled for 05/15/23 -RTC 2 months  Eulah Pont, MD  I spent 44 minutes of time, including in depth chart review, independent review of results as outlined above, communicating results with the patient directly, face-to-face time with the patient, coordinating care, ordering studies and medications as appropriate, and documentation.

## 2023-05-12 ENCOUNTER — Other Ambulatory Visit (HOSPITAL_COMMUNITY): Payer: Self-pay

## 2023-05-12 ENCOUNTER — Telehealth: Payer: Self-pay | Admitting: Pharmacy Technician

## 2023-05-12 LAB — IGA: Immunoglobulin A: 326 mg/dL — ABNORMAL HIGH (ref 70–320)

## 2023-05-12 LAB — TISSUE TRANSGLUTAMINASE, IGA: (tTG) Ab, IgA: <1 U/mL

## 2023-05-12 NOTE — Telephone Encounter (Signed)
 Pharmacy Patient Advocate Encounter   Received notification from Patient Pharmacy that prior authorization for XIFAXAN 550MG  is required/requested.   Insurance verification completed.   The patient is insured through [facility] ADVANTAGE/RX ADVANCE .   Per test claim: PA required; PA submitted to above mentioned insurance via CoverMyMeds Key/confirmation #/EOC [id number] Status is pending

## 2023-05-13 LAB — IGG 4: IgG, Subclass 4: 167 mg/dL — ABNORMAL HIGH (ref 2–96)

## 2023-05-13 NOTE — Anesthesia Preprocedure Evaluation (Signed)
Anesthesia Evaluation  Patient identified by MRN, date of birth, ID band Patient awake    Reviewed: Allergy & Precautions, NPO status , Patient's Chart, lab work & pertinent test results  Airway Mallampati: II  TM Distance: >3 FB Neck ROM: Full    Dental no notable dental hx.    Pulmonary sleep apnea , former smoker   Pulmonary exam normal        Cardiovascular hypertension, Pt. on medications + CAD   Rhythm:Regular Rate:Normal  IMPRESSIONS      1. Left ventricular ejection fraction, by estimation, is 60 to 65%. Left ventricular ejection fraction by 3D volume is 57 %. The left ventricle has normal function. The left ventricle has no regional wall motion abnormalities. Left ventricular diastolic  parameters are indeterminate. The average left ventricular global longitudinal strain is -23.3 %. The global longitudinal strain is normal.  2. Right ventricular systolic function is normal. The right ventricular size is normal.  3. Left atrial size was mildly dilated.  4. The mitral valve is normal in structure. No evidence of mitral valve regurgitation. No evidence of mitral stenosis.  5. The aortic valve is normal in structure. Aortic valve regurgitation is not visualized. No aortic stenosis is present.  6. The inferior vena cava is normal in size with greater than 50% respiratory variability, suggesting right atrial pressure of 3 mmHg.   Comparison(s): No significant change from prior study. Prior images reviewed side by side.    Neuro/Psych  Headaches  negative psych ROS   GI/Hepatic Neg liver ROS, hiatal hernia,GERD  ,,Pancreatitis    Endo/Other  negative endocrine ROS    Renal/GU negative Renal ROS  negative genitourinary   Musculoskeletal negative musculoskeletal ROS (+)    Abdominal Normal abdominal exam  (+)   Peds  Hematology  (+) Blood dyscrasia, anemia   Anesthesia Other Findings    Reproductive/Obstetrics                             Anesthesia Physical Anesthesia Plan  ASA: 3  Anesthesia Plan: MAC   Post-op Pain Management:    Induction: Intravenous  PONV Risk Score and Plan: 1 and Propofol infusion and Treatment may vary due to age or medical condition  Airway Management Planned: Simple Face Mask and Nasal Cannula  Additional Equipment: None  Intra-op Plan:   Post-operative Plan:   Informed Consent: I have reviewed the patients History and Physical, chart, labs and discussed the procedure including the risks, benefits and alternatives for the proposed anesthesia with the patient or authorized representative who has indicated his/her understanding and acceptance.     Dental advisory given  Plan Discussed with: CRNA  Anesthesia Plan Comments:        Anesthesia Quick Evaluation

## 2023-05-15 ENCOUNTER — Ambulatory Visit (HOSPITAL_BASED_OUTPATIENT_CLINIC_OR_DEPARTMENT_OTHER): Payer: PPO | Admitting: Anesthesiology

## 2023-05-15 ENCOUNTER — Ambulatory Visit (HOSPITAL_COMMUNITY): Payer: Self-pay | Admitting: Anesthesiology

## 2023-05-15 ENCOUNTER — Other Ambulatory Visit: Payer: Self-pay

## 2023-05-15 ENCOUNTER — Encounter (HOSPITAL_COMMUNITY): Payer: Self-pay | Admitting: Gastroenterology

## 2023-05-15 ENCOUNTER — Telehealth: Payer: Self-pay

## 2023-05-15 ENCOUNTER — Ambulatory Visit (HOSPITAL_COMMUNITY)
Admission: RE | Admit: 2023-05-15 | Discharge: 2023-05-15 | Disposition: A | Discharge status: Home / Self Care | Payer: PPO | Attending: Gastroenterology | Admitting: Gastroenterology

## 2023-05-15 ENCOUNTER — Encounter (HOSPITAL_COMMUNITY)
Admission: RE | Disposition: A | Discharge status: Home / Self Care | Payer: Self-pay | Source: Home / Self Care | Attending: Gastroenterology

## 2023-05-15 DIAGNOSIS — K862 Cyst of pancreas: Secondary | ICD-10-CM | POA: Insufficient documentation

## 2023-05-15 DIAGNOSIS — K859 Acute pancreatitis without necrosis or infection, unspecified: Secondary | ICD-10-CM | POA: Insufficient documentation

## 2023-05-15 DIAGNOSIS — K31A14 Gastric intestinal metaplasia without dysplasia, involving the cardia: Secondary | ICD-10-CM | POA: Insufficient documentation

## 2023-05-15 DIAGNOSIS — Z87891 Personal history of nicotine dependence: Secondary | ICD-10-CM | POA: Diagnosis not present

## 2023-05-15 DIAGNOSIS — G4730 Sleep apnea, unspecified: Secondary | ICD-10-CM | POA: Insufficient documentation

## 2023-05-15 DIAGNOSIS — I899 Noninfective disorder of lymphatic vessels and lymph nodes, unspecified: Secondary | ICD-10-CM | POA: Insufficient documentation

## 2023-05-15 DIAGNOSIS — T182XXA Foreign body in stomach, initial encounter: Secondary | ICD-10-CM | POA: Diagnosis not present

## 2023-05-15 DIAGNOSIS — R897 Abnormal histological findings in specimens from other organs, systems and tissues: Secondary | ICD-10-CM | POA: Diagnosis not present

## 2023-05-15 DIAGNOSIS — Z8719 Personal history of other diseases of the digestive system: Secondary | ICD-10-CM

## 2023-05-15 DIAGNOSIS — K259 Gastric ulcer, unspecified as acute or chronic, without hemorrhage or perforation: Secondary | ICD-10-CM

## 2023-05-15 DIAGNOSIS — K2970 Gastritis, unspecified, without bleeding: Secondary | ICD-10-CM

## 2023-05-15 DIAGNOSIS — R197 Diarrhea, unspecified: Secondary | ICD-10-CM | POA: Diagnosis not present

## 2023-05-15 DIAGNOSIS — K3189 Other diseases of stomach and duodenum: Secondary | ICD-10-CM | POA: Insufficient documentation

## 2023-05-15 DIAGNOSIS — K319 Disease of stomach and duodenum, unspecified: Secondary | ICD-10-CM | POA: Diagnosis not present

## 2023-05-15 DIAGNOSIS — K2950 Unspecified chronic gastritis without bleeding: Secondary | ICD-10-CM | POA: Insufficient documentation

## 2023-05-15 DIAGNOSIS — K2270 Barrett's esophagus without dysplasia: Secondary | ICD-10-CM | POA: Diagnosis not present

## 2023-05-15 DIAGNOSIS — R768 Other specified abnormal immunological findings in serum: Secondary | ICD-10-CM

## 2023-05-15 DIAGNOSIS — I2510 Atherosclerotic heart disease of native coronary artery without angina pectoris: Secondary | ICD-10-CM | POA: Diagnosis not present

## 2023-05-15 DIAGNOSIS — K208 Other esophagitis without bleeding: Secondary | ICD-10-CM | POA: Diagnosis not present

## 2023-05-15 DIAGNOSIS — I10 Essential (primary) hypertension: Secondary | ICD-10-CM | POA: Diagnosis not present

## 2023-05-15 DIAGNOSIS — K449 Diaphragmatic hernia without obstruction or gangrene: Secondary | ICD-10-CM | POA: Diagnosis not present

## 2023-05-15 DIAGNOSIS — K869 Disease of pancreas, unspecified: Secondary | ICD-10-CM | POA: Diagnosis not present

## 2023-05-15 DIAGNOSIS — R1084 Generalized abdominal pain: Secondary | ICD-10-CM

## 2023-05-15 HISTORY — DX: Gastritis, unspecified, without bleeding: K29.70

## 2023-05-15 HISTORY — PX: ESOPHAGOGASTRODUODENOSCOPY (EGD) WITH PROPOFOL: SHX5813

## 2023-05-15 HISTORY — DX: Other specified abnormal immunological findings in serum: R76.8

## 2023-05-15 HISTORY — DX: Gastric ulcer, unspecified as acute or chronic, without hemorrhage or perforation: K25.9

## 2023-05-15 HISTORY — PX: BIOPSY: SHX5522

## 2023-05-15 HISTORY — DX: Personal history of other diseases of the digestive system: Z87.19

## 2023-05-15 HISTORY — PX: EUS: SHX5427

## 2023-05-15 SURGERY — UPPER ENDOSCOPIC ULTRASOUND (EUS) RADIAL
Anesthesia: Monitor Anesthesia Care

## 2023-05-15 MED ORDER — SODIUM CHLORIDE 0.9 % IV SOLN
INTRAVENOUS | Status: DC
Start: 2023-05-15 — End: 2023-05-15

## 2023-05-15 MED ORDER — PROPOFOL 10 MG/ML IV BOLUS
INTRAVENOUS | Status: DC | PRN
Start: 2023-05-15 — End: 2023-05-15
  Administered 2023-05-15: 150 mg via INTRAVENOUS
  Administered 2023-05-15: 20 mg via INTRAVENOUS

## 2023-05-15 MED ORDER — SUCRALFATE 1 G PO TABS: 1.0000 g | ORAL_TABLET | Freq: Two times a day (BID) | ORAL | 1 refills | Status: DC

## 2023-05-15 MED ORDER — PROPOFOL 500 MG/50ML IV EMUL
INTRAVENOUS | Status: DC | PRN
  Administered 2023-05-15: 125 ug/kg/min via INTRAVENOUS

## 2023-05-15 MED ORDER — PROPOFOL 1000 MG/100ML IV EMUL
INTRAVENOUS | Status: AC
Start: 2023-05-15 — End: ?
  Filled 2023-05-15: qty 100

## 2023-05-15 MED ORDER — PANTOPRAZOLE SODIUM 40 MG PO TBEC: 40.0000 mg | DELAYED_RELEASE_TABLET | Freq: Two times a day (BID) | ORAL | 12 refills | Status: DC

## 2023-05-15 NOTE — Telephone Encounter (Signed)
-----   Message from [facility] sent at 05/15/2023 12:55 PM EST ----- Regarding: Followup CD, Please see my procedure note. Talking with the patient a bit more, he has been having and experiencing early satiety. He needs a solid food gastric emptying study just to confirm that he does not have gastroparesis. Will need to see what the biopsy shows of the stomach lining and ulcer. As long as there is no evidence of malignancy, my plan would be to repeat EGD/EUS in 6 to 8 weeks.  He will need to be on clear liquid diet the night before (if found to have gastroparesis, would need some Reglan likely). I will plan to try to take samples of the pancreas at that time as long as they are not foodstuffs again. Left some other thoughts, in regards to his chronic diarrhea for you to consider as he continues to have that and is most concerned about that. Thanks. GM  Ramona Ruark, Please move forward with EGD/EUS in 6 to 8 weeks with me as outlined. Dr. Leonides Schanz and Waynetta Sandy can decide what else they want to do with the other recommendations. Thanks. GM

## 2023-05-15 NOTE — Transfer of Care (Signed)
Immediate Anesthesia Transfer of Care Note  Patient: Jaevyn Tabacco  Procedure(s) Performed: UPPER ENDOSCOPIC ULTRASOUND (EUS) RADIAL BIOPSY ESOPHAGOGASTRODUODENOSCOPY (EGD) WITH PROPOFOL  Patient Location: PACU and Endoscopy Unit  Anesthesia Type:MAC  Level of Consciousness: drowsy and patient cooperative  Airway & Oxygen Therapy: Patient Spontanous Breathing and Patient connected to nasal cannula oxygen  Post-op Assessment: Report given to RN and Post -op Vital signs reviewed and stable  Post vital signs: Reviewed and stable  Last Vitals:  Vitals Value Taken Time  BP 113/63 05/15/23 0954  Temp    Pulse 51 05/15/23 0957  Resp 20 05/15/23 0957  SpO2 100 % 05/15/23 0957  Vitals shown include unfiled device data.  Last Pain:  Vitals:   05/15/23 0954  TempSrc:   PainSc: Asleep         Complications: No notable events documented.

## 2023-05-15 NOTE — Telephone Encounter (Signed)
Called patient to inform him of Dr. Leonides Schanz recommendations. Patient would prefer the Amoxicillin- Clev 875 mg/ 125 mg TID for 10 days. Patient states this medication works and would like to administer TID as opposed to taking the medication BID. Please advise

## 2023-05-15 NOTE — Anesthesia Procedure Notes (Signed)
Procedure Name: MAC Date/Time: 05/15/2023 8:52 AM  Performed by: Maurene Capes, CRNAPre-anesthesia Checklist: Patient identified, Emergency Drugs available, Suction available, Patient being monitored and Timeout performed Patient Re-evaluated:Patient Re-evaluated prior to induction Oxygen Delivery Method: Nasal cannula Preoxygenation: Pre-oxygenation with 100% oxygen Induction Type: IV induction Placement Confirmation: positive ETCO2 Dental Injury: Teeth and Oropharynx as per pre-operative assessment

## 2023-05-15 NOTE — Anesthesia Postprocedure Evaluation (Signed)
Anesthesia Post Note  Patient: Dean Guzman  Procedure(s) Performed: UPPER ENDOSCOPIC ULTRASOUND (EUS) RADIAL BIOPSY ESOPHAGOGASTRODUODENOSCOPY (EGD) WITH PROPOFOL     Patient location during evaluation: PACU Anesthesia Type: MAC Level of consciousness: awake and alert Pain management: pain level controlled Vital Signs Assessment: post-procedure vital signs reviewed and stable Respiratory status: spontaneous breathing, nonlabored ventilation, respiratory function stable and patient connected to nasal cannula oxygen Cardiovascular status: stable and blood pressure returned to baseline Postop Assessment: no apparent nausea or vomiting Anesthetic complications: no   No notable events documented.  Last Vitals:  Vitals:   05/15/23 1010 05/15/23 1020  BP: 122/64 127/68  Pulse: (!) 52 (!) 55  Resp: 19 17  Temp:    SpO2: 99% 97%    Last Pain:  Vitals:   05/15/23 1020  TempSrc:   PainSc: 0-No pain                 Earl Lites P Jakory Matsuo

## 2023-05-15 NOTE — Telephone Encounter (Signed)
EUS EGD has been entered for 07/20/23 at 215 pm at [facility] with GM   The pt has been advised of the appt and instructions via My Chart- his preference of communication. I have also mailed a copy of the information

## 2023-05-15 NOTE — Discharge Instructions (Signed)
YOU HAD AN ENDOSCOPIC PROCEDURE TODAY: Refer to the procedure report and other information in the discharge instructions given to you for any specific questions about what was found during the examination. If this information does not answer your questions, please call [place] office at [phone] to clarify.   YOU SHOULD EXPECT: Some feelings of bloating in the abdomen. Passage of more gas than usual. Walking can help get rid of the air that was put into your GI tract during the procedure and reduce the bloating. If you had a lower endoscopy (such as a colonoscopy or flexible sigmoidoscopy) you may notice spotting of blood in your stool or on the toilet paper. Some abdominal soreness may be present for a day or two, also.  DIET: Your first meal following the procedure should be a light meal and then it is ok to progress to your normal diet. A half-sandwich or bowl of soup is an example of a good first meal. Heavy or fried foods are harder to digest and may make you feel nauseous or bloated. Drink plenty of fluids but you should avoid alcoholic beverages for 24 hours. If you had a esophageal dilation, please see attached instructions for diet.    ACTIVITY: Your care partner should take you home directly after the procedure. You should plan to take it easy, moving slowly for the rest of the day. You can resume normal activity the day after the procedure however YOU SHOULD NOT DRIVE, use power tools, machinery or perform tasks that involve climbing or major physical exertion for 24 hours (because of the sedation medicines used during the test).   SYMPTOMS TO REPORT IMMEDIATELY: A gastroenterologist can be reached at any hour. Please call [phone]  for any of the following symptoms:  Following upper endoscopy (EGD, EUS, ERCP, esophageal dilation) Vomiting of blood or coffee ground material  New, significant abdominal pain  New, significant chest pain or pain under the shoulder blades  Painful or  persistently difficult swallowing  New shortness of breath  Black, tarry-looking or red, bloody stools  FOLLOW UP:  If any biopsies were taken you will be contacted by phone or by letter within the next 1-3 weeks. Call [phone]  if you have not heard about the biopsies in 3 weeks.  Please also call with any specific questions about appointments or follow up tests.

## 2023-05-15 NOTE — H&P (Signed)
GASTROENTEROLOGY PROCEDURE H&P NOTE   Primary Care Physician: Sharlene Dory, DO  HPI: Dean Guzman is a 72 y.o. male who presents for EGD/EUS to evaluate pancreas in setting of recurrent pancreatitis and persistently elevated IgG4 levels with possible biopsy.  Past Medical History:  Diagnosis Date   Barrett's esophagus    BPH (benign prostatic hyperplasia)    Colon polyps    Complication of anesthesia    during biopsy, felt hot and sweating   Coronary artery disease    stent 4 2023   FH: factor V Leiden mutation    pt tested negatiove    niece died of leiden factor five complications, sister has   Frequent headaches    GERD (gastroesophageal reflux disease)    History of hiatal hernia    History of kidney stones    Hyperlipidemia    Hypertension    Migraines     no recent migraines had visual aura   Neuromuscular disorder (HCC)    neuropathy feet   Pancreatitis    Pneumonia    Prostate cancer (HCC)    Sleep apnea    no current issues no longer CPAP machine   Past Surgical History:  Procedure Laterality Date   colonscopy     CORONARY ANGIOGRAPHY N/A 09/08/2021   Procedure: CORONARY ANGIOGRAPHY;  Surgeon: Tonny Bollman, MD;  Location: [facility] INVASIVE CV LAB;  Service: Cardiovascular;  Laterality: N/A;   CORONARY PRESSURE/FFR STUDY N/A 09/08/2021   Procedure: INTRAVASCULAR PRESSURE WIRE/FFR STUDY;  Surgeon: Tonny Bollman, MD;  Location: [facility] INVASIVE CV LAB;  Service: Cardiovascular;  Laterality: N/A;   CORONARY STENT INTERVENTION N/A 09/08/2021   Procedure: CORONARY STENT INTERVENTION;  Surgeon: Tonny Bollman, MD;  Location: [facility] INVASIVE CV LAB;  Service: Cardiovascular;  Laterality: N/A;   CYSTOSCOPY N/A 01/13/2020   Procedure: CYSTOSCOPY FLEXIBLE;  Surgeon: Crista Elliot, MD;  Location: [facility];  Service: Urology;  Laterality: N/A;  NO SEEDS FOUND IN BLADDER   CYSTOSCOPY/URETEROSCOPY/HOLMIUM LASER/STENT PLACEMENT Left 06/15/2022    Procedure: CYSTOSCOPY LEFT URETEROSCOPY/HOLMIUM LASER/STENT PLACEMENT;  Surgeon: Crista Elliot, MD;  Location: [facility];  Service: Urology;  Laterality: Left;  1 HR FOR CASE   ESOPHAGEAL MANOMETRY N/A 04/28/2022   Procedure: ESOPHAGEAL MANOMETRY (EM);  Surgeon: Charlott Rakes, MD;  Location: [facility];  Service: Gastroenterology;  Laterality: N/A;   EXTRACORPOREAL SHOCK WAVE LITHOTRIPSY Left 06/10/2022   Procedure: LEFT EXTRACORPOREAL SHOCK WAVE LITHOTRIPSY (ESWL);  Surgeon: Belva Agee, MD;  Location: [facility];  Service: Urology;  Laterality: Left;   EYE SURGERY     Lasik   MASS EXCISION Left 02/08/2021   Procedure: EXCISION MASS OF MUCOID CYST WITH DISTAL INTERPHANGEAL JOINT ARTHROTOMY LEFT MIDDLE FINGER;  Surgeon: Betha Loa, MD;  Location: [facility];  Service: Orthopedics;  Laterality: Left;   NASAL ENDOSCOPY     PROSTATE BIOPSY  may 2021 and 2020   RADIOACTIVE SEED IMPLANT N/A 01/13/2020   Procedure: RADIOACTIVE SEED IMPLANT/BRACHYTHERAPY IMPLANT;  Surgeon: Crista Elliot, MD;  Location: [facility];  Service: Urology;  Laterality: N/A;    73 SEEDS IMPLANTED   SPACE OAR INSTILLATION N/A 01/13/2020   Procedure: SPACE OAR INSTILLATION;  Surgeon: Crista Elliot, MD;  Location: [facility];  Service: Urology;  Laterality: N/A;   XI ROBOTIC ASSISTED HIATAL HERNIA REPAIR N/A 05/19/2022   Procedure: XI ROBOTIC ASSISTED REPAIR OF PARAESOPHAGEAL HIATAL HERNIA WITH FUNDOPLICATION;  Surgeon: Michaell Cowing,  Viviann Spare, MD;  Location: [facility];  Service: General;  Laterality: N/A;   Current Facility-Administered Medications  Medication Dose Route Frequency Provider Last Rate Last Admin   0.9 %  sodium chloride infusion   Intravenous Continuous Mansouraty, Netty Starring., MD        Current Facility-Administered Medications:    0.9 %  sodium chloride infusion, , Intravenous, Continuous, Mansouraty, Netty Starring., MD No Known  Allergies Family History  Problem Relation Age of Onset   Arthritis Mother    Heart disease Father    Hyperlipidemia Father    Hypertension Father    Breast cancer Sister    Diabetes Maternal Grandmother    Hearing loss Paternal Grandmother    Early death Paternal Grandmother    Hearing loss Paternal Grandfather    Early death Paternal Grandfather    Allergic rhinitis Neg Hx    Asthma Neg Hx    Eczema Neg Hx    Urticaria Neg Hx    Prostate cancer Neg Hx    Colon cancer Neg Hx    Pancreatic cancer Neg Hx    Esophageal cancer Neg Hx    Social History   Socioeconomic History   Marital status: Married    Spouse name: Not on file   Number of children: 3   Years of education: Not on file   Highest education level: Not on file  Occupational History    Comment: full time  Tobacco Use   Smoking status: Former    Current packs/day: 0.00    Average packs/day: 1 pack/day for 12.0 years (12.0 ttl pk-yrs)    Types: Cigarettes    Start date: 05/30/1966    Quit date: 05/30/1978    Years since quitting: 44.9   Smokeless tobacco: Never  Vaping Use   Vaping status: Never Used  Substance and Sexual Activity   Alcohol use: Yes    Comment: rarely   Drug use: Not Currently   Sexual activity: Not Currently  Other Topics Concern   Not on file  Social History Narrative   Not on file   Social Drivers of Health   Financial Resource Strain: Low Risk  (07/11/2022)   Overall Financial Resource Strain (CARDIA)    Difficulty of Paying Living Expenses: Not hard at all  Food Insecurity: No Food Insecurity (02/07/2023)   Hunger Vital Sign    Worried About Running Out of Food in the Last Year: Never true    Ran Out of Food in the Last Year: Never true  Transportation Needs: No Transportation Needs (07/11/2022)   PRAPARE - Administrator, Civil Service (Medical): No    Lack of Transportation (Non-Medical): No  Physical Activity: Sufficiently Active (07/11/2022)   Exercise Vital Sign     Days of Exercise per Week: 5 days    Minutes of Exercise per Session: 40 min  Stress: No Stress Concern Present (07/11/2022)   [employer] of Occupational Health - Occupational Stress Questionnaire    Feeling of Stress : Not at all  Social Connections: Socially Isolated (07/11/2022)   Social Connection and Isolation Panel [NHANES]    Frequency of Communication with Friends and Family: Once a week    Frequency of Social Gatherings with Friends and Family: Once a week    Attends Religious Services: Never    Database administrator or Organizations: No    Attends Banker Meetings: Never    Marital Status: Married  Catering manager Violence: Not At Risk (02/03/2023)  Received from [employer]   Intimate Partner Violence    Are you denied basic needs such as food, clothing, or medical care?: No    In the past 12 months have you been in a relationship with a person who hurts, threatens, or tries to control you?: No    Are you denied basic needs such as food, clothing, or medical care?: No    In the past 12 months have you been in a relationship with a person who hurts, threatens, or tries to control you?: No    Physical Exam: Today's Vitals   05/15/23 0734  BP: 133/66  Pulse: 62  Resp: 18  Temp: (!) 97.3 F (36.3 C)  TempSrc: Temporal  SpO2: 100%  Weight: 81.6 kg  Height: 6\' 2"  (1.88 m)  PainSc: 0-No pain   Body mass index is 23.11 kg/m. GEN: NAD EYE: Sclerae anicteric ENT: MMM CV: Non-tachycardic GI: Soft, NT/ND NEURO:  Alert & Oriented x 3  Lab Results: No results for input(s): "WBC", "HGB", "HCT", "PLT" in the last 72 hours. BMET No results for input(s): "NA", "K", "CL", "CO2", "GLUCOSE", "BUN", "CREATININE", "CALCIUM" in the last 72 hours. LFT No results for input(s): "PROT", "ALBUMIN", "AST", "ALT", "ALKPHOS", "BILITOT", "BILIDIR", "IBILI" in the last 72 hours. PT/INR No results for input(s): "LABPROT", "INR" in the last 72  hours.   Impression / Plan: This is a 72 y.o.male who presents for EGD/EUS to evaluate pancreas in setting of recurrent pancreatitis and persistently elevated IgG4 levels with possible biopsy.  The risks of an EUS including intestinal perforation, bleeding, infection, aspiration, and medication effects were discussed as was the possibility it may not give a definitive diagnosis if a biopsy is performed.  When a biopsy of the pancreas is done as part of the EUS, there is an additional risk of pancreatitis at the rate of about 1-2%.  It was explained that procedure related pancreatitis is typically mild, although it can be severe and even life threatening, which is why we do not perform random pancreatic biopsies and only biopsy a lesion/area we feel is concerning enough to warrant the risk.   The risks and benefits of endoscopic evaluation/treatment were discussed with the patient and/or family; these include but are not limited to the risk of perforation, infection, bleeding, missed lesions, lack of diagnosis, severe illness requiring hospitalization, as well as anesthesia and sedation related illnesses.  The patient's history has been reviewed, patient examined, no change in status, and deemed stable for procedure.  The patient and/or family is agreeable to proceed.    Corliss Parish, MD [facility] Office # [phone]

## 2023-05-15 NOTE — Op Note (Signed)
[facility] Patient Name: Dean Guzman Procedure Date: 05/15/2023 MRN: [id number] Attending MD: Corliss Parish , MD, [id number] Date of Birth: July 31, 1950 CSN: [id number] Age: 72 Admit Type: Outpatient Procedure:                Upper EUS Indications:              Acute recurrent pancreatitis, Follow-up of                            Barrett's esophagus, Diarrhea Providers:                Corliss Parish, MD, Fransisca Connors, Harrington Challenger, Technician Referring MD:              Medicines:                Monitored Anesthesia Care Complications:            No immediate complications. Estimated Blood Loss:     Estimated blood loss was minimal. Procedure:                Pre-Anesthesia Assessment:                           - Prior to the procedure, a History and Physical                            was performed, and patient medications and                            allergies were reviewed. The patient's tolerance of                            previous anesthesia was also reviewed. The risks                            and benefits of the procedure and the sedation                            options and risks were discussed with the patient.                            All questions were answered, and informed consent                            was obtained. Prior Anticoagulants: The patient has                            taken no anticoagulant or antiplatelet agents                            except for aspirin. ASA Grade Assessment: III - A                            patient with  severe systemic disease. After                            reviewing the risks and benefits, the patient was                            deemed in satisfactory condition to undergo the                            procedure.                           After obtaining informed consent, the endoscope was                            passed under direct vision. Throughout  the                            procedure, the patient's blood pressure, pulse, and                            oxygen saturations were monitored continuously. The                            [id number]-[id number] ([id number]) Olympus endoscope was introduced                            through the mouth, and advanced to the second part                            of duodenum. The [id number]-[id number] ([id number]) Olympus                            duodenoscope was introduced through the mouth, and                            advanced to the area of papilla. The [id number]                            ([id number]) Olympus linear ultrasound scope was                            introduced through the mouth, and advanced to the                            duodenum for ultrasound examination from the                            stomach and duodenum. The upper EUS was                            accomplished without difficulty. The patient  tolerated the procedure. Scope In: Scope Out: Findings:      ENDOSCOPIC FINDING: :      No gross lesions were noted in the proximal esophagus and in the mid       esophagus.      The esophagus and gastroesophageal junction were examined with white       light and narrow band imaging (NBI) from a forward view and retroflexed       position. There were esophageal mucosal changes secondary to established       long-segment Barrett's disease. These changes involved the mucosa along       an irregular Z-line (40 cm from the incisors). One tongue of       salmon-colored mucosa was present from 36 to 40 cm and two tongues of       salmon-colored mucosa were present from 35 to 40 cm. Biopsies were taken       with a cold forceps for histology (35 to 36 cm). Biopsies were taken       with a cold forceps for histology (37 to 38 cm). Biopsies were taken       with a cold forceps for histology (39 to 40 cm).      A 1 cm hiatal hernia was present.      A medium amount of food (residue)  was found in the entire examined       stomach. Suction via Endoscope was performed.      One non-bleeding cratered gastric ulcer was found in the gastric fundus.       The lesion was 20 mm in largest dimension. Being an atypical region for       an ulcer, biopsies were taken with a cold forceps for       histology/dysplasia rule out.      Diffuse severely erythematous mucosa without bleeding was found in the       cardia. Biopsies were taken with a cold forceps for histology.      Patchy moderately erythematous mucosa without bleeding was found in the       entire examined stomach. Biopsies were taken with a cold forceps for       histology and Helicobacter pylori testing.      No gross lesions were noted in the duodenal bulb, in the first portion       of the duodenum and in the second portion of the duodenum. Biopsies were       taken with a cold forceps for histology.      The major papilla was normal.      ENDOSONOGRAPHIC FINDING: :      Pancreatic parenchymal abnormalities were noted in the distal pancreatic       body-tail region. These consisted of cysts and hyperechoic foci with       shadowing.      There was no sign of significant parenchymal abnormality within the       head/genu/proximal body.      The diameter of the main pancreatic duct (MPD) measured:      - HOP 2.4 mm (head of pancreas)      - NOP 2.3 mm (neck of pancreas)      - BOP 1.7 mm (body of the pancreas)      - TOP 0.9 mm (tail of the pancreas).      There was no sign of significant endosonographic abnormality in the  common bile duct (3.5 mm) and in the common hepatic duct (4.5 mm). An       unremarkable gallbladder and ducts with regular contour were identified.      Endosonographic imaging of the ampulla showed no intramural       (subepithelial) lesion.      Endosonographic imaging in the visualized portion of the liver showed no       mass.      No malignant-appearing lymph nodes were visualized in  the left gastric       region (level 17), gastrohepatic ligament (level 18), celiac region       (level 20), peripancreatic region and porta hepatis region.      The celiac region visualized. Impression:               EGD impression:                           - No gross lesions in the proximal esophagus and in                            the mid esophagus.                           - Esophageal mucosal changes secondary to                            established long-segment Barrett's disease.                            Biopsied as outlined above.                           - 1 cm hiatal hernia.                           - A medium amount of food (residue) in the stomach.                            Suctioned significant amount but still solid                            foodstuffs present.                           - Non-bleeding gastric ulcer noted in the fundus.                            Biopsied.                           - Severe erythematous mucosa in the cardia.                            Biopsied.                           - Moderate erythematous mucosa in the rest of the  stomach. Biopsied.                           - No gross lesions in the duodenal bulb, in the                            first portion of the duodenum and in the second                            portion of the duodenum. Biopsied.                           - Normal major papilla.                           EUS impression:                           - Pancreatic parenchymal abnormalities consisting                            of cysts and hyperechoic foci were noted in the                            distal pancreatic body/tail.                           - There was no sign of significant parenchymal                            abnormality within the pancreatic head, genu of the                            pancreas and proximal pancreatic body.                           - Main pancreatic duct  (MPD) diameter was measured.                            Endosonographically, the MPD had a tortuous                            appearance only in the tail region                           - There was no sign of significant pathology in the                            common bile duct and in the common hepatic duct.                            The gallbladder was normal in appearance without                            cholelithiasis.                           -  No malignant-appearing lymph nodes were                            visualized in the left gastric region (level 17),                            gastrohepatic ligament (level 18), celiac region                            (level 20), peripancreatic region and porta hepatis                            region. Moderate Sedation:      Not Applicable - Patient had care per Anesthesia. Recommendation:           - The patient will be observed post-procedure,                            until all discharge criteria are met.                           - Discharge patient to home.                           - Patient has a contact number available for                            emergencies. The signs and symptoms of potential                            delayed complications were discussed with the                            patient. Return to normal activities tomorrow.                            Written discharge instructions were provided to the                            patient.                           - Resume previous diet.                           - Observe patient's clinical course.                           - Await path results.                           - Initiate PPI 40 mg twice daily.                           - Carafate 1 g twice daily.                           -  Repeat EUS/EGD in 6 to 8 weeks as able to                            reevaluate ulceration and attempt sampling of                            pancreas when there is not  significant foodstuffs                            in the stomach (will need to be on clear liquids                            day prior).                           - Consider SIBO breath testing.                           - For patient's diarrheal symptoms, consider                            empiric IBS/D Xifaxan therapy if clinically                            appropriate. Procedure Code(s):        --- Professional ---                           [phone], Esophagogastroduodenoscopy, flexible,                            transoral; with endoscopic ultrasound examination                            limited to the esophagus, stomach or duodenum, and                            adjacent structures                           43239, Esophagogastroduodenoscopy, flexible,                            transoral; with biopsy, single or multiple Diagnosis Code(s):        --- Professional ---                           K22.70, Barrett's esophagus without dysplasia                           K44.9, Diaphragmatic hernia without obstruction or                            gangrene                           K25.9, Gastric ulcer, unspecified  as acute or                            chronic, without hemorrhage or perforation                           K31.89, Other diseases of stomach and duodenum                           K86.2, Cyst of pancreas                           K86.9, Disease of pancreas, unspecified                           I89.9, Noninfective disorder of lymphatic vessels                            and lymph nodes, unspecified                           K85.90, Acute pancreatitis without necrosis or                            infection, unspecified                           R19.7, Diarrhea, unspecified                           R93.3, Abnormal findings on diagnostic imaging of                            other parts of digestive tract CPT copyright 2022 American Medical Association. All rights  reserved. The codes documented in this report are preliminary and upon coder review may  be revised to meet current compliance requirements. Corliss Parish, MD 05/15/2023 10:12:50 AM Number of Addenda: 0

## 2023-05-16 ENCOUNTER — Other Ambulatory Visit: Payer: Self-pay | Admitting: *Deleted

## 2023-05-16 ENCOUNTER — Other Ambulatory Visit: Payer: Self-pay

## 2023-05-16 DIAGNOSIS — K580 Irritable bowel syndrome with diarrhea: Secondary | ICD-10-CM

## 2023-05-16 MED ORDER — AMOXICILLIN-POT CLAVULANATE 875-125 MG PO TABS: 1.0000 | ORAL_TABLET | Freq: Three times a day (TID) | ORAL | 0 refills | Status: DC

## 2023-05-16 MED ORDER — AMOXICILLIN-POT CLAVULANATE 875-125 MG PO TABS: 1.0000 | ORAL_TABLET | Freq: Three times a day (TID) | ORAL | 0 refills | Status: AC

## 2023-05-16 NOTE — Telephone Encounter (Signed)
What is the status of this PA?    Thanks!

## 2023-05-16 NOTE — Telephone Encounter (Signed)
Received orders via Dr. Leonides Schanz stating it will be ok to order the Augmentin 875/125 mg TID for 10 days. Called patient to notify of the orders and was sent to the [facility] in [place], [place]. Patient appreciative.

## 2023-05-17 NOTE — Telephone Encounter (Signed)
Will need to call insurance to check status as it is still pending.

## 2023-05-18 ENCOUNTER — Other Ambulatory Visit: Payer: Self-pay

## 2023-05-18 ENCOUNTER — Encounter (HOSPITAL_COMMUNITY): Payer: Self-pay | Admitting: Gastroenterology

## 2023-05-18 ENCOUNTER — Encounter: Payer: Self-pay | Admitting: Internal Medicine

## 2023-05-18 DIAGNOSIS — R1084 Generalized abdominal pain: Secondary | ICD-10-CM

## 2023-05-18 DIAGNOSIS — K3189 Other diseases of stomach and duodenum: Secondary | ICD-10-CM

## 2023-05-18 LAB — SURGICAL PATHOLOGY

## 2023-05-20 ENCOUNTER — Encounter: Payer: Self-pay | Admitting: Gastroenterology

## 2023-05-25 ENCOUNTER — Other Ambulatory Visit (HOSPITAL_COMMUNITY): Payer: Self-pay

## 2023-05-25 NOTE — Telephone Encounter (Signed)
Patients plan called to check status- representative states they never received out request. Request re-submitted over the phone. Sending chart notes as requested to plan fax [phone].  Case/reference # [id number]

## 2023-05-25 NOTE — Telephone Encounter (Signed)
It has been 2 weeks. Has insurance made a decision?

## 2023-05-26 ENCOUNTER — Other Ambulatory Visit: Payer: Self-pay | Admitting: Internal Medicine

## 2023-05-26 DIAGNOSIS — R197 Diarrhea, unspecified: Secondary | ICD-10-CM

## 2023-05-27 ENCOUNTER — Encounter: Payer: Self-pay | Admitting: Internal Medicine

## 2023-05-29 ENCOUNTER — Other Ambulatory Visit (HOSPITAL_COMMUNITY): Payer: Self-pay

## 2023-05-29 NOTE — Telephone Encounter (Signed)
Pharmacy Patient Advocate Encounter  Received notification from [facility] [employer] that Prior Authorization for Xifaxan 550MG  tablets has been APPROVED from 05-26-2023 to 06-09-2023. Ran test claim, Copay is $548.27 for quantity of 42 tablets per 14 days. This test claim was processed through [facility]- copay amounts may vary at other pharmacies due to pharmacy/plan contracts, or as the patient moves through the different stages of their insurance plan.   PA #/Case ID/Reference #: [id number]

## 2023-06-05 ENCOUNTER — Encounter: Payer: Self-pay | Admitting: Internal Medicine

## 2023-06-05 ENCOUNTER — Encounter (HOSPITAL_COMMUNITY)
Admission: RE | Admit: 2023-06-05 | Discharge: 2023-06-05 | Disposition: A | Discharge status: Home / Self Care | Payer: PPO | Source: Ambulatory Visit | Attending: Internal Medicine | Admitting: Internal Medicine

## 2023-06-05 DIAGNOSIS — K3189 Other diseases of stomach and duodenum: Secondary | ICD-10-CM | POA: Diagnosis not present

## 2023-06-05 DIAGNOSIS — K30 Functional dyspepsia: Secondary | ICD-10-CM | POA: Diagnosis not present

## 2023-06-05 DIAGNOSIS — R1084 Generalized abdominal pain: Secondary | ICD-10-CM | POA: Insufficient documentation

## 2023-06-05 MED ORDER — TECHNETIUM TC 99M SULFUR COLLOID
2.1400 | Freq: Once | INTRAVENOUS | Status: AC | PRN
  Administered 2023-06-05: 2.14 via ORAL

## 2023-06-06 ENCOUNTER — Other Ambulatory Visit: Payer: Self-pay | Admitting: Gastroenterology

## 2023-06-08 ENCOUNTER — Other Ambulatory Visit: Payer: Self-pay

## 2023-06-08 MED ORDER — METOCLOPRAMIDE HCL 5 MG PO TABS: 5.0000 mg | ORAL_TABLET | Freq: Three times a day (TID) | ORAL | 2 refills | Status: DC

## 2023-07-01 ENCOUNTER — Other Ambulatory Visit: Payer: Self-pay | Admitting: Cardiology

## 2023-07-03 NOTE — Telephone Encounter (Signed)
 Rx refill sent to pharmacy.

## 2023-07-04 ENCOUNTER — Other Ambulatory Visit: Payer: Self-pay | Admitting: Cardiology

## 2023-07-04 ENCOUNTER — Ambulatory Visit: Payer: PPO | Admitting: Internal Medicine

## 2023-07-04 ENCOUNTER — Encounter: Payer: Self-pay | Admitting: Internal Medicine

## 2023-07-04 VITALS — BP 120/60 | HR 56 | Ht 74.0 in | Wt 205.0 lb

## 2023-07-04 DIAGNOSIS — Z860100 Personal history of colon polyps, unspecified: Secondary | ICD-10-CM

## 2023-07-04 DIAGNOSIS — R768 Other specified abnormal immunological findings in serum: Secondary | ICD-10-CM | POA: Diagnosis not present

## 2023-07-04 DIAGNOSIS — K259 Gastric ulcer, unspecified as acute or chronic, without hemorrhage or perforation: Secondary | ICD-10-CM

## 2023-07-04 DIAGNOSIS — Z8719 Personal history of other diseases of the digestive system: Secondary | ICD-10-CM

## 2023-07-04 DIAGNOSIS — R197 Diarrhea, unspecified: Secondary | ICD-10-CM

## 2023-07-04 DIAGNOSIS — K3184 Gastroparesis: Secondary | ICD-10-CM

## 2023-07-04 DIAGNOSIS — K2270 Barrett's esophagus without dysplasia: Secondary | ICD-10-CM

## 2023-07-04 DIAGNOSIS — R159 Full incontinence of feces: Secondary | ICD-10-CM

## 2023-07-04 DIAGNOSIS — R1030 Lower abdominal pain, unspecified: Secondary | ICD-10-CM

## 2023-07-04 DIAGNOSIS — R1013 Epigastric pain: Secondary | ICD-10-CM

## 2023-07-04 NOTE — Patient Instructions (Signed)
 Please follow up in 3 months.  _______________________________________________________  If your blood pressure at your visit was 140/90 or greater, please contact your primary care physician to follow up on this.  _______________________________________________________  If you are age 73 or older, your body mass index should be between 23-30. Your Body mass index is 26.32 kg/m. If this is out of the aforementioned range listed, please consider follow up with your Primary Care Provider.  If you are age 62 or younger, your body mass index should be between 19-25. Your Body mass index is 26.32 kg/m. If this is out of the aformentioned range listed, please consider follow up with your Primary Care Provider.   ________________________________________________________  The [facility] providers would like to encourage you to use MYCHART to communicate with providers for non-urgent requests or questions.  Due to long hold times on the telephone, sending your provider a message by [facility] may be a faster and more efficient way to get a response.  Please allow 48 business hours for a response.  Please remember that this is for non-urgent requests.  _______________________________________________________

## 2023-07-04 NOTE — Progress Notes (Signed)
 Chief Complaint: Abdominal pain, diarrhea  HPI : 73 year old male with history of paraesophageal hiatal hernia s/p hiatal hernia surgery and Toupet fundoplication, prostate cancer s/p radiation, CAD s/p PCI, prior pancreatitis, and Barrett's esophagus presents for follow up of abdominal pain and diarrhea  He was admitted at [facility] 9/5 to 02/05/2023 for ileus and pancreatitis, which developed while he was on a cruise on his way to [place].  Patient was treated with supportive care and temporary NG tube placement.   Interval History: Reglan  seems to help and has fixed his early satiety. His bowel habits have regulated. He notes an improvement in his diarrhea after his EUS and taking sucralfate /pantoprazole . Denies constipation. Endorses some fecal incontinence, which did not respond to fiber or hemorrhoidal surgery. He never took the rifaximin  because it was too expensive despite insurance approval.   Wt Readings from Last 3 Encounters:  07/04/23 205 lb (93 kg)  05/15/23 180 lb (81.6 kg)  05/11/23 192 lb 9.6 oz (87.4 kg)   Past Medical History:  Diagnosis Date   Barrett's esophagus    BPH (benign prostatic hyperplasia)    Colon polyps    Complication of anesthesia    during biopsy, felt hot and sweating   Coronary artery disease    stent 4 2023   FH: factor V Leiden mutation    pt tested negatiove    niece died of leiden factor five complications, sister has   Frequent headaches    GERD (gastroesophageal reflux disease)    History of hiatal hernia    History of kidney stones    Hyperlipidemia    Hypertension    Migraines     no recent migraines had visual aura   Neuromuscular disorder (HCC)    neuropathy feet   Pancreatitis    Pneumonia    Prostate cancer (HCC)    Sleep apnea    no current issues no longer CPAP machine     Past Surgical History:  Procedure Laterality Date   BIOPSY  05/15/2023   Procedure: BIOPSY;  Surgeon: Wilhelmenia Aloha Raddle., MD;  Location: [facility];  Service: Gastroenterology;;   colonscopy     CORONARY ANGIOGRAPHY N/A 09/08/2021   Procedure: CORONARY ANGIOGRAPHY;  Surgeon: Wonda Sharper, MD;  Location: [facility] INVASIVE CV LAB;  Service: Cardiovascular;  Laterality: N/A;   CORONARY PRESSURE/FFR STUDY N/A 09/08/2021   Procedure: INTRAVASCULAR PRESSURE WIRE/FFR STUDY;  Surgeon: Wonda Sharper, MD;  Location: [facility] INVASIVE CV LAB;  Service: Cardiovascular;  Laterality: N/A;   CORONARY STENT INTERVENTION N/A 09/08/2021   Procedure: CORONARY STENT INTERVENTION;  Surgeon: Wonda Sharper, MD;  Location: [facility] INVASIVE CV LAB;  Service: Cardiovascular;  Laterality: N/A;   CYSTOSCOPY N/A 01/13/2020   Procedure: CYSTOSCOPY FLEXIBLE;  Surgeon: Carolee Sherwood JONETTA DOUGLAS, MD;  Location: [facility];  Service: Urology;  Laterality: N/A;  NO SEEDS FOUND IN BLADDER   CYSTOSCOPY/URETEROSCOPY/HOLMIUM LASER/STENT PLACEMENT Left 06/15/2022   Procedure: CYSTOSCOPY LEFT URETEROSCOPY/HOLMIUM LASER/STENT PLACEMENT;  Surgeon: Carolee Sherwood JONETTA DOUGLAS, MD;  Location: [facility];  Service: Urology;  Laterality: Left;  1 HR FOR CASE   ESOPHAGEAL MANOMETRY N/A 04/28/2022   Procedure: ESOPHAGEAL MANOMETRY (EM);  Surgeon: Dianna Specking, MD;  Location: [facility];  Service: Gastroenterology;  Laterality: N/A;   ESOPHAGOGASTRODUODENOSCOPY (EGD) WITH PROPOFOL  N/A 05/15/2023   Procedure: ESOPHAGOGASTRODUODENOSCOPY (EGD) WITH PROPOFOL ;  Surgeon: Wilhelmenia Aloha Raddle., MD;  Location: [facility];  Service: Gastroenterology;  Laterality: N/A;   EUS N/A 05/15/2023   Procedure:  UPPER ENDOSCOPIC ULTRASOUND (EUS) RADIAL;  Surgeon: Wilhelmenia Aloha Raddle., MD;  Location: [facility];  Service: Gastroenterology;  Laterality: N/A;   EXTRACORPOREAL SHOCK WAVE LITHOTRIPSY Left 06/10/2022   Procedure: LEFT EXTRACORPOREAL SHOCK WAVE LITHOTRIPSY (ESWL);  Surgeon: Rosalind Zachary NOVAK, MD;  Location: [facility];  Service: Urology;  Laterality: Left;   EYE SURGERY      Lasik   MASS EXCISION Left 02/08/2021   Procedure: EXCISION MASS OF MUCOID CYST WITH DISTAL INTERPHANGEAL JOINT ARTHROTOMY LEFT MIDDLE FINGER;  Surgeon: Murrell Drivers, MD;  Location: [facility];  Service: Orthopedics;  Laterality: Left;   NASAL ENDOSCOPY     PROSTATE BIOPSY  may 2021 and 2020   RADIOACTIVE SEED IMPLANT N/A 01/13/2020   Procedure: RADIOACTIVE SEED IMPLANT/BRACHYTHERAPY IMPLANT;  Surgeon: Carolee Sherwood JONETTA DOUGLAS, MD;  Location: [facility];  Service: Urology;  Laterality: N/A;    73 SEEDS IMPLANTED   SPACE OAR INSTILLATION N/A 01/13/2020   Procedure: SPACE OAR INSTILLATION;  Surgeon: Carolee Sherwood JONETTA DOUGLAS, MD;  Location: [facility];  Service: Urology;  Laterality: N/A;   XI ROBOTIC ASSISTED HIATAL HERNIA REPAIR N/A 05/19/2022   Procedure: XI ROBOTIC ASSISTED REPAIR OF PARAESOPHAGEAL HIATAL HERNIA WITH FUNDOPLICATION;  Surgeon: Sheldon Standing, MD;  Location: [facility];  Service: General;  Laterality: N/A;   Family History  Problem Relation Age of Onset   Arthritis Mother    Heart disease Father    Hyperlipidemia Father    Hypertension Father    Breast cancer Sister    Diabetes Maternal Grandmother    Hearing loss Paternal Grandmother    Early death Paternal Grandmother    Hearing loss Paternal Grandfather    Early death Paternal Grandfather    Allergic rhinitis Neg Hx    Asthma Neg Hx    Eczema Neg Hx    Urticaria Neg Hx    Prostate cancer Neg Hx    Colon cancer Neg Hx    Pancreatic cancer Neg Hx    Esophageal cancer Neg Hx    Social History   Tobacco Use   Smoking status: Former    Current packs/day: 0.00    Average packs/day: 1 pack/day for 12.0 years (12.0 ttl pk-yrs)    Types: Cigarettes    Start date: 05/30/1966    Quit date: 05/30/1978    Years since quitting: 45.1   Smokeless tobacco: Never  Vaping Use   Vaping status: Never Used  Substance Use Topics   Alcohol use: Yes    Comment: rarely   Drug use: Not Currently    Current Outpatient Medications  Medication Sig Dispense Refill   acetaminophen  (TYLENOL ) 325 MG tablet Take 325 mg by mouth every 6 (six) hours as needed for mild pain (pain score 1-3) or moderate pain (pain score 4-6).     aspirin  EC 81 MG tablet Take 81 mg by mouth daily. Swallow whole.     cholecalciferol  (VITAMIN D3) 25 MCG (1000 UT) tablet Take 1,000 Units by mouth daily.     dicyclomine  (BENTYL ) 10 MG capsule TAKE 1 CAPSULE (10 MG TOTAL) BY MOUTH 3 (THREE) TIMES DAILY AS NEEDED FOR SPASMS. 270 capsule 1   diltiazem  (CARDIZEM  CD) 360 MG 24 hr capsule Take 1 capsule (360 mg total) by mouth daily. 100 capsule 3   ezetimibe  (ZETIA ) 10 MG tablet TAKE 1 TABLET BY MOUTH EVERY DAY 100 tablet 3   ipratropium (ATROVENT ) 0.06 % nasal spray Place 2 sprays into both nostrils  3 (three) times daily as needed for rhinitis. 45 mL 3   lisinopril  (ZESTRIL ) 5 MG tablet Take 1 tablet (5 mg total) by mouth daily. 90 tablet 3   metoCLOPramide  (REGLAN ) 5 MG tablet Take 1 tablet (5 mg total) by mouth 3 (three) times daily before meals. 90 tablet 2   ondansetron  (ZOFRAN ) 4 MG tablet Take 4 mg by mouth every 8 (eight) hours as needed for nausea or vomiting.     pantoprazole  (PROTONIX ) 40 MG tablet Take 1 tablet (40 mg total) by mouth 2 (two) times daily before a meal. 60 tablet 12   rosuvastatin  (CRESTOR ) 20 MG tablet Take 1 tablet (20 mg total) by mouth daily. 100 tablet 2   sucralfate  (CARAFATE ) 1 g tablet Take 1 tablet (1 g total) by mouth 2 (two) times daily. 60 tablet 1   Vibegron (GEMTESA) 75 MG TABS Take 75 mg by mouth daily.     No current facility-administered medications for this visit.   No Known Allergies  Physical Exam: BP 120/60   Pulse (!) 56   Ht 6' 2 (1.88 m)   Wt 205 lb (93 kg)   BMI 26.32 kg/m  Constitutional: Pleasant,well-developed, male in no acute distress. HEENT: Normocephalic and atraumatic. Conjunctivae are normal. No scleral icterus. Cardiovascular: Normal rate, regular  rhythm.  Pulmonary/chest: Effort normal and breath sounds normal. No wheezing, rales or rhonchi. Abdominal: Soft, nondistended, tender in the epigastric area and RLQ. Bowel sounds active throughout. There are no masses palpable. No hepatomegaly. Extremities: No edema Neurological: Alert and oriented to person place and time. Skin: Skin is warm and dry. No rashes noted. Psychiatric: Normal mood and affect. Behavior is normal.  Labs 01/2023: CBC normal.  BMP normal.  Phosphorus normal.  Ferritin normal at 299.  Iron saturation slightly low at 8%.  Vitamin B12 normal.  TSH normal.  Folate normal.  Stool norovirus was negative.  Lipase elevated 849.  LFTs normal.  Lipid panel was normal.  Labs 02/2023: IgG4 elevated at 234. Lipase mildly elevated at 62. ANA negative. Vit D nml. GI profile negative. Pancreatic fecal elastase normal. Fecal calprotectin borderline at 71.  Labs 03/2023: IgG4 elevated at 196  Labs 04/2023: CBC nml. CMP nml. Lipase nml. CRP nml. TTG IgA negative. IgA nml. IgG4 mildly elevated at 167.  Barium esophagram 05/20/22: IMPRESSION: Expected postoperative appearance status post fundoplication. No demonstrated leak or other complication.  CT A/P w/o contrast 06/05/22: MPRESSION: 1. Moderate left hydroureteronephrosis secondary to a 7 x 7 mm calculus at the left ureterovesical junction. 2. Sigmoid colonic diverticulosis without evidence of acute diverticulitis. 3. Postsurgical changes about the GE junction suggesting recent hernia repair. 4. Additional chronic findings as above. 5. Aortic atherosclerosis.  CT A/P w/contrast 02/02/23: 1.  Inflammatory stranding in the region of the pancreatic head and adjacent proximal duodenum, with duodenal wall thickening. In the setting of high serum lipase, these findings likely represent duodenitis/pancreatitis.  2.  Mild distention of the small bowel loops, without definite transition point, extending just proximal to the ileocecal  valve, likely secondary to ileus or slow transit, although early or partial small bowel obstruction cannot be excluded with certainty.  3.  Minimally prominent appendix without other primary or secondary signs of appendicitis, as discussed.  4.  Radiation pellets/fiducial markers in the region of the prostate, with adjacent circumferential rectal wall thickening, likely secondary to prior radiation treatment for prostate cancer.  5.  Additional adjacent pelvic fluid is nonspecific and age indeterminate, however may  be secondary to some combination of the findings in impression #1, #2, and #4.  6.  Possible small side branch IPMN in the mid pancreas as described.   MRI abd/MRCP 02/16/23: IMPRESSION: Gastric distention and diffuse bowel dilatation throughout the abdomen. Pelvic bowel loops are not visualized on this exam. Differential diagnosis includes bowel obstruction and ileus. Consider abdomen pelvis CT for further evaluation. No radiographic signs of acute pancreatitis. No evidence of biliary ductal dilatation or choledocholithiasis.  RUQ U/S 03/03/23: IMPRESSION: No cholelithiasis or sonographic evidence for acute cholecystitis.  CT A/P w/contrast 03/15/23: IMPRESSION: 1. Acute uncomplicated sigmoid diverticulitis. 2. Moderately distended stomach. 3. Aortic atherosclerosis (ICD10-I70.0).  CT A/P w/contrast 04/19/23: IMPRESSION: Resolution of sigmoid diverticulitis since prior study. No evidence of complication or other acute findings  Gastric emptying study 06/05/23: IMPRESSION: Scintigraphic findings of delayed gastric emptying.  Colonoscopy 08/07/18: Good prep. 2 small transverse and descending colon tubular adenomas (one removed with forceps, one removed with hot snare), sigmoid diverticulosis, medium sized hemorrhoids.  EGD 09/10/19: Barrett's esophagus 3 cm 36-39 cm from the incisors that was biopsied. Gastritis. Large hiatal hernia. Normal duodenum. Path: Barrett's mucosa.  Negative for dysplasia.   Esophageal manometry 04/28/22: Normal and relaxation LES pressures. Intact peristalsis and complete bolus clearance on 80% of swallow. 4.6 cm hiatal hernia noted.   EGD 10/31/22: 7 cm of Barrett's esophagus in the distal esophagus that was biopsied, ulcerated congested nodular mucosa in the antrum that was biopsied, large amount of food in stomach, normal duodenum. Path: Chronic inactive gastritis. Negative for H pylori. Barrett's mucosa negative for dysplasia.  Colonoscopy 05/03/23: - The examined portion of the ileum was normal. - Diverticulosis in the sigmoid colon, in the descending colon and in the ascending colon. - Non- bleeding internal hemorrhoids. - Biopsies were taken with a cold forceps for histology in the entire colon. Path: 1. Surgical [P], random colon bxs :       COLONIC MUCOSA WITH MILD EDEMA.       NO ACTIVE INFLAMMATION, CHRONIC CHANGES, GRANULOMAS ARE OTHER PATHOLOGIC       FEATURES.       SEE NOTE.       Diagnosis Note : The differential diagnosis includes resolving infectious       colitis but there are no specific features to allow a definitive diagnosis.       Dr. Rebbecca reviewed this case and agrees.   EUS 05/15/23: EGD impression: - No gross lesions in the proximal esophagus and in the mid esophagus. - Esophageal mucosal changes secondary to established long- segment Barrett' s disease. Biopsied as outlined above. - 1 cm hiatal hernia. - A medium amount of food ( residue) in the stomach. Suctioned significant amount but still solid foodstuffs present. - Non- bleeding gastric ulcer noted in the fundus. Biopsied. - Severe erythematous mucosa in the cardia. Biopsied. - Moderate erythematous mucosa in the rest of the stomach. Biopsied. - No gross lesions in the duodenal bulb, in the first portion of the duodenum and in the second portion of the duodenum. Biopsied. - Normal major papilla. EUS impression: - Pancreatic parenchymal abnormalities  consisting of cysts and hyperechoic foci were noted in the distal pancreatic body/ tail. - There was no sign of significant parenchymal abnormality within the pancreatic head, genu of the pancreas and proximal pancreatic body. - Main pancreatic duct ( MPD) diameter was measured. Endosonographically, the MPD had a tortuous appearance only in the tail region - There was no sign of significant pathology  in the common bile duct and in the common hepatic duct. The gallbladder was normal in appearance without cholelithiasis. - No malignant- appearing lymph nodes were visualized in the left gastric region ( level 17) , gastrohepatic ligament ( level 18) , celiac region ( level 20) , peripancreatic region and porta hepatis region.  ASSESSMENT AND PLAN: Gastroparesis Diarrhea - resolved Epigastric and lower abdominal pain Fecal incontinence Elevated serum IgG4 History of pancreatitis History of ileus Barrett's esophagus History of colon polyps Patient was recently diagnosed with gastroparesis based upon a gastric emptying study.  He was started on Reglan  and has had some improvement in his symptoms.  Since he has only been on Reglan  for 1 month, I am inclined to continue this for now. We can readdress whether or not to continue Reglan  during his next clinic visit. Unfortunately the rifaximin  was too expensive for him to be able to take, but his bowel habits have regulated themselves at this time. Patient is scheduled for a repeat EUS later this month to attempt pancreas biopsies. Patient does note some fecal incontinence that has not responded to fiber in the past. Thus will refer him to pelvic floor physical therapy to see if this helps with his symptoms. -Continue Reglan  for now -Continue pantoprazole  and sucralfate  at least until next EUS -Upper EUS is scheduled for 07/20/23 -Referral to pelvic floor PT with Channing Ferrari -RTC 3 months  Estefana Kidney, MD  I spent 34 minutes of time, including in depth  chart review, independent review of results as outlined above, communicating results with the patient directly, face-to-face time with the patient, coordinating care, ordering studies and medications as appropriate, and documentation.

## 2023-07-05 DIAGNOSIS — C61 Malignant neoplasm of prostate: Secondary | ICD-10-CM | POA: Diagnosis not present

## 2023-07-06 ENCOUNTER — Telehealth: Payer: Self-pay | Admitting: Family Medicine

## 2023-07-06 NOTE — Telephone Encounter (Signed)
 Copied from CRM [id number]. Topic: Medicare AWV >> Jul 06, 2023  9:05 AM Nathanel DEL wrote: Reason for CRM: Called LVM 07/05/2023 to schedule AWV. Please schedule Virtual or Telehealth visits ONLY.   Nathanel Paschal; Care Guide Ambulatory Clinical Support [place] [facility] Direct Dial: [phone]

## 2023-07-10 ENCOUNTER — Encounter: Payer: Self-pay | Admitting: Cardiology

## 2023-07-11 ENCOUNTER — Encounter: Payer: Self-pay | Admitting: Cardiology

## 2023-07-11 ENCOUNTER — Ambulatory Visit: Payer: PPO | Attending: Cardiology | Admitting: Cardiology

## 2023-07-11 VITALS — BP 130/68 | HR 102 | Ht 74.0 in | Wt 207.0 lb

## 2023-07-11 DIAGNOSIS — R0609 Other forms of dyspnea: Secondary | ICD-10-CM | POA: Diagnosis not present

## 2023-07-11 DIAGNOSIS — I2510 Atherosclerotic heart disease of native coronary artery without angina pectoris: Secondary | ICD-10-CM

## 2023-07-11 DIAGNOSIS — E782 Mixed hyperlipidemia: Secondary | ICD-10-CM

## 2023-07-11 DIAGNOSIS — I10 Essential (primary) hypertension: Secondary | ICD-10-CM

## 2023-07-11 NOTE — Progress Notes (Unsigned)
Cardiology Office Note:    Date:  07/11/2023   ID:  Clarisa Fling, DOB 1950-06-02, MRN [id number]  PCP:  Sharlene Dory, DO  Cardiologist:  Gypsy Balsam, MD    Referring MD: Sharlene Dory*   Chief Complaint  Patient presents with   Follow-up    History of Present Illness:    Dean Guzman is a 73 y.o. male past medical history significant for factor V Leiden mutation, dyslipidemia, essential hypertension, coronary artery disease.  In 2024 he did have cardiac catheterization done he required stent to mid LAD, since that time cardiac wise doing well however a few months ago he ended up being on the cruise developed severe abdominal aches was airlifted via [employer] he was found to have pancreatitis after the diverticulosis diverticulitis since that time he lost significant amount of weight, last time I seen him my office to cut down his antihypertensives medication secondary to 5 his blood pressure was low.  Comes today doing well appetite is back started eating better walk on the regular basis have no difficulty doing it overall seems to like he recovered from all event quite nicely.  Past Medical History:  Diagnosis Date   Barrett's esophagus    BPH (benign prostatic hyperplasia)    Colon polyps    Complication of anesthesia    during biopsy, felt hot and sweating   Coronary artery disease    stent 4 2023   FH: factor V Leiden mutation    pt tested negatiove    niece died of leiden factor five complications, sister has   Frequent headaches    GERD (gastroesophageal reflux disease)    History of hiatal hernia    History of kidney stones    Hyperlipidemia    Hypertension    Migraines     no recent migraines had visual aura   Neuromuscular disorder (HCC)    neuropathy feet   Pancreatitis    Pneumonia    Prostate cancer (HCC)    Sleep apnea    no current issues no longer CPAP machine    Past Surgical History:  Procedure Laterality Date   BIOPSY   05/15/2023   Procedure: BIOPSY;  Surgeon: Lemar Lofty., MD;  Location: [facility];  Service: Gastroenterology;;   colonscopy     CORONARY ANGIOGRAPHY N/A 09/08/2021   Procedure: CORONARY ANGIOGRAPHY;  Surgeon: Tonny Bollman, MD;  Location: [facility] INVASIVE CV LAB;  Service: Cardiovascular;  Laterality: N/A;   CORONARY PRESSURE/FFR STUDY N/A 09/08/2021   Procedure: INTRAVASCULAR PRESSURE WIRE/FFR STUDY;  Surgeon: Tonny Bollman, MD;  Location: [facility] INVASIVE CV LAB;  Service: Cardiovascular;  Laterality: N/A;   CORONARY STENT INTERVENTION N/A 09/08/2021   Procedure: CORONARY STENT INTERVENTION;  Surgeon: Tonny Bollman, MD;  Location: [facility] INVASIVE CV LAB;  Service: Cardiovascular;  Laterality: N/A;   CYSTOSCOPY N/A 01/13/2020   Procedure: CYSTOSCOPY FLEXIBLE;  Surgeon: Crista Elliot, MD;  Location: [facility];  Service: Urology;  Laterality: N/A;  NO SEEDS FOUND IN BLADDER   CYSTOSCOPY/URETEROSCOPY/HOLMIUM LASER/STENT PLACEMENT Left 06/15/2022   Procedure: CYSTOSCOPY LEFT URETEROSCOPY/HOLMIUM LASER/STENT PLACEMENT;  Surgeon: Crista Elliot, MD;  Location: [facility];  Service: Urology;  Laterality: Left;  1 HR FOR CASE   ESOPHAGEAL MANOMETRY N/A 04/28/2022   Procedure: ESOPHAGEAL MANOMETRY (EM);  Surgeon: Charlott Rakes, MD;  Location: [facility];  Service: Gastroenterology;  Laterality: N/A;   ESOPHAGOGASTRODUODENOSCOPY (EGD) WITH PROPOFOL N/A 05/15/2023   Procedure: ESOPHAGOGASTRODUODENOSCOPY (EGD) WITH PROPOFOL;  Surgeon:  Mansouraty, Netty Starring., MD;  Location: [facility];  Service: Gastroenterology;  Laterality: N/A;   EUS N/A 05/15/2023   Procedure: UPPER ENDOSCOPIC ULTRASOUND (EUS) RADIAL;  Surgeon: Lemar Lofty., MD;  Location: [facility];  Service: Gastroenterology;  Laterality: N/A;   EXTRACORPOREAL SHOCK WAVE LITHOTRIPSY Left 06/10/2022   Procedure: LEFT EXTRACORPOREAL SHOCK WAVE LITHOTRIPSY (ESWL);  Surgeon: Belva Agee, MD;  Location:  [facility];  Service: Urology;  Laterality: Left;   EYE SURGERY     Lasik   MASS EXCISION Left 02/08/2021   Procedure: EXCISION MASS OF MUCOID CYST WITH DISTAL INTERPHANGEAL JOINT ARTHROTOMY LEFT MIDDLE FINGER;  Surgeon: Betha Loa, MD;  Location:  [facility];  Service: Orthopedics;  Laterality: Left;   NASAL ENDOSCOPY     PROSTATE BIOPSY  may 2021 and 2020   RADIOACTIVE SEED IMPLANT N/A 01/13/2020   Procedure: RADIOACTIVE SEED IMPLANT/BRACHYTHERAPY IMPLANT;  Surgeon: Crista Elliot, MD;  Location: [facility];  Service: Urology;  Laterality: N/A;    73 SEEDS IMPLANTED   SPACE OAR INSTILLATION N/A 01/13/2020   Procedure: SPACE OAR INSTILLATION;  Surgeon: Crista Elliot, MD;  Location: [facility];  Service: Urology;  Laterality: N/A;   XI ROBOTIC ASSISTED HIATAL HERNIA REPAIR N/A 05/19/2022   Procedure: XI ROBOTIC ASSISTED REPAIR OF PARAESOPHAGEAL HIATAL HERNIA WITH FUNDOPLICATION;  Surgeon: Karie Soda, MD;  Location: [facility];  Service: General;  Laterality: N/A;    Current Medications: Current Meds  Medication Sig   acetaminophen (TYLENOL) 325 MG tablet Take 325 mg by mouth every 6 (six) hours as needed for mild pain (pain score 1-3) or moderate pain (pain score 4-6).   aspirin EC 81 MG tablet Take 81 mg by mouth daily. Swallow whole.   cholecalciferol (VITAMIN D3) 25 MCG (1000 UT) tablet Take 1,000 Units by mouth daily.   dicyclomine (BENTYL) 10 MG capsule TAKE 1 CAPSULE (10 MG TOTAL) BY MOUTH 3 (THREE) TIMES DAILY AS NEEDED FOR SPASMS.   diltiazem (CARDIZEM CD) 360 MG 24 hr capsule Take 1 capsule (360 mg total) by mouth daily.   ezetimibe (ZETIA) 10 MG tablet TAKE 1 TABLET BY MOUTH EVERY DAY   ipratropium (ATROVENT) 0.06 % nasal spray Place 2 sprays into both nostrils 3 (three) times daily as needed for rhinitis.   lisinopril (ZESTRIL) 5 MG tablet Take 1 tablet (5 mg total) by mouth daily.   metoCLOPramide (REGLAN) 5  MG tablet Take 1 tablet (5 mg total) by mouth 3 (three) times daily before meals.   ondansetron (ZOFRAN) 4 MG tablet Take 4 mg by mouth every 8 (eight) hours as needed for nausea or vomiting.   pantoprazole (PROTONIX) 40 MG tablet Take 1 tablet (40 mg total) by mouth 2 (two) times daily before a meal.   rosuvastatin (CRESTOR) 20 MG tablet Take 1 tablet (20 mg total) by mouth daily.   sucralfate (CARAFATE) 1 g tablet Take 1 tablet (1 g total) by mouth 2 (two) times daily.   Vibegron (GEMTESA) 75 MG TABS Take 75 mg by mouth daily.     Allergies:   Patient has no known allergies.   Social History   Socioeconomic History   Marital status: Married    Spouse name: Not on file   Number of children: 3   Years of education: Not on file   Highest education level: Not on file  Occupational History    Comment: full time  Tobacco Use   Smoking  status: Former    Current packs/day: 0.00    Average packs/day: 1 pack/day for 12.0 years (12.0 ttl pk-yrs)    Types: Cigarettes    Start date: 05/30/1966    Quit date: 05/30/1978    Years since quitting: 45.1   Smokeless tobacco: Never  Vaping Use   Vaping status: Never Used  Substance and Sexual Activity   Alcohol use: Yes    Comment: rarely   Drug use: Not Currently   Sexual activity: Not Currently  Other Topics Concern   Not on file  Social History Narrative   Not on file   Social Drivers of Health   Financial Resource Strain: Low Risk  (07/11/2022)   Overall Financial Resource Strain (CARDIA)    Difficulty of Paying Living Expenses: Not hard at all  Food Insecurity: No Food Insecurity (02/07/2023)   Hunger Vital Sign    Worried About Running Out of Food in the Last Year: Never true    Ran Out of Food in the Last Year: Never true  Transportation Needs: No Transportation Needs (07/11/2022)   PRAPARE - Administrator, Civil Service (Medical): No    Lack of Transportation (Non-Medical): No  Physical Activity: Sufficiently Active  (07/11/2022)   Exercise Vital Sign    Days of Exercise per Week: 5 days    Minutes of Exercise per Session: 40 min  Stress: No Stress Concern Present (07/11/2022)   [employer] of Occupational Health - Occupational Stress Questionnaire    Feeling of Stress : Not at all  Social Connections: Socially Isolated (07/11/2022)   Social Connection and Isolation Panel [NHANES]    Frequency of Communication with Friends and Family: Once a week    Frequency of Social Gatherings with Friends and Family: Once a week    Attends Religious Services: Never    Database administrator or Organizations: No    Attends Engineer, structural: Never    Marital Status: Married     Family History: The patient's family history includes Arthritis in Denard's mother; Breast cancer in Kendrick's sister; Diabetes in Mahamud's maternal grandmother; Early death in Hamdan's paternal grandfather and paternal grandmother; Hearing loss in Khaalid's paternal grandfather and paternal grandmother; Heart disease in Lola's father; Hyperlipidemia in Ladislaus's father; Hypertension in Baldomero's father. There is no history of Allergic rhinitis, Asthma, Eczema, Urticaria, Prostate cancer, Colon cancer, Pancreatic cancer, or Esophageal cancer. ROS:   Please see the history of present illness.    All 14 point review of systems negative except as described per history of present illness  EKGs/Labs/Other Studies Reviewed:         Recent Labs: 05/11/2023: ALT 16; BUN 16; Creatinine, Ser 0.95; Hemoglobin 14.0; Platelets 181.0; Potassium 4.2; Sodium 138  Recent Lipid Panel    Component Value Date/Time   CHOL 112 02/10/2023 1352   CHOL 112 08/01/2022 1057   TRIG 85 02/10/2023 1352   HDL 37 (L) 02/10/2023 1352   HDL 45 08/01/2022 1057   CHOLHDL 3.0 02/10/2023 1352   VLDL 22.0 06/29/2021 1107   LDLCALC 58 02/10/2023 1352    Physical Exam:    VS:  BP 130/68 (BP Location: Left Arm, Patient Position: Sitting)   Pulse (!) 102   Ht  6\' 2"  (1.88 m)   Wt 207 lb (93.9 kg)   SpO2 97%   BMI 26.58 kg/m     Wt Readings from Last 3 Encounters:  07/11/23 207 lb (93.9 kg)  07/04/23 205 lb (93  kg)  05/15/23 180 lb (81.6 kg)     GEN:  Well nourished, well developed in no acute distress HEENT: Normal NECK: No JVD; No carotid bruits LYMPHATICS: No lymphadenopathy CARDIAC: RRR, no murmurs, no rubs, no gallops RESPIRATORY:  Clear to auscultation without rales, wheezing or rhonchi  ABDOMEN: Soft, non-tender, non-distended MUSCULOSKELETAL:  No edema; No deformity  SKIN: Warm and dry LOWER EXTREMITIES: no swelling NEUROLOGIC:  Alert and oriented x 3 PSYCHIATRIC:  Normal affect   ASSESSMENT:    1. Coronary artery disease involving native coronary artery of native heart without angina pectoris   2. Essential hypertension   3. Dyspnea on exertion   4. Mixed hyperlipidemia    PLAN:    In order of problems listed above:  Coronary disease stable on guideline directed medical therapy asymptomatic I encouraged him to continue exercises. Essential hypertension: Blood pressure well-controlled continue present management. Dyspnea exertion denies having any. Mixed dyslipidemia I did review K PN which show LDL 50 HDL 37 we will continue present management    Medication Adjustments/Labs and Tests Ordered: Current medicines are reviewed at length with the patient today.  Concerns regarding medicines are outlined above.  No orders of the defined types were placed in this encounter.  Medication changes: No orders of the defined types were placed in this encounter.   Signed, Georgeanna Lea, MD, [facility] 07/11/2023 11:26 AM    [facility]

## 2023-07-11 NOTE — Patient Instructions (Signed)

## 2023-07-12 ENCOUNTER — Other Ambulatory Visit: Payer: Self-pay | Admitting: Family Medicine

## 2023-07-12 ENCOUNTER — Encounter: Payer: Self-pay | Admitting: Internal Medicine

## 2023-07-12 DIAGNOSIS — N200 Calculus of kidney: Secondary | ICD-10-CM | POA: Diagnosis not present

## 2023-07-12 DIAGNOSIS — C61 Malignant neoplasm of prostate: Secondary | ICD-10-CM | POA: Diagnosis not present

## 2023-07-14 ENCOUNTER — Encounter (HOSPITAL_COMMUNITY): Payer: Self-pay | Admitting: Gastroenterology

## 2023-07-17 ENCOUNTER — Encounter: Payer: Self-pay | Admitting: Internal Medicine

## 2023-07-20 ENCOUNTER — Ambulatory Visit (HOSPITAL_COMMUNITY)
Admission: RE | Admit: 2023-07-20 | Discharge: 2023-07-20 | Disposition: A | Discharge status: Home / Self Care | Payer: PPO | Attending: Gastroenterology | Admitting: Gastroenterology

## 2023-07-20 ENCOUNTER — Other Ambulatory Visit: Payer: Self-pay

## 2023-07-20 ENCOUNTER — Ambulatory Visit (HOSPITAL_COMMUNITY): Payer: PPO | Admitting: Anesthesiology

## 2023-07-20 ENCOUNTER — Ambulatory Visit (HOSPITAL_BASED_OUTPATIENT_CLINIC_OR_DEPARTMENT_OTHER): Payer: PPO | Admitting: Anesthesiology

## 2023-07-20 ENCOUNTER — Encounter: Payer: Self-pay | Admitting: Internal Medicine

## 2023-07-20 ENCOUNTER — Encounter (HOSPITAL_COMMUNITY)
Admission: RE | Disposition: A | Discharge status: Home / Self Care | Payer: Self-pay | Source: Home / Self Care | Attending: Gastroenterology

## 2023-07-20 ENCOUNTER — Encounter (HOSPITAL_COMMUNITY): Payer: Self-pay | Admitting: Gastroenterology

## 2023-07-20 DIAGNOSIS — Z8546 Personal history of malignant neoplasm of prostate: Secondary | ICD-10-CM | POA: Insufficient documentation

## 2023-07-20 DIAGNOSIS — Z955 Presence of coronary angioplasty implant and graft: Secondary | ICD-10-CM | POA: Diagnosis not present

## 2023-07-20 DIAGNOSIS — K3189 Other diseases of stomach and duodenum: Secondary | ICD-10-CM | POA: Insufficient documentation

## 2023-07-20 DIAGNOSIS — K8689 Other specified diseases of pancreas: Secondary | ICD-10-CM

## 2023-07-20 DIAGNOSIS — K259 Gastric ulcer, unspecified as acute or chronic, without hemorrhage or perforation: Secondary | ICD-10-CM

## 2023-07-20 DIAGNOSIS — I2510 Atherosclerotic heart disease of native coronary artery without angina pectoris: Secondary | ICD-10-CM | POA: Diagnosis not present

## 2023-07-20 DIAGNOSIS — Z79899 Other long term (current) drug therapy: Secondary | ICD-10-CM | POA: Insufficient documentation

## 2023-07-20 DIAGNOSIS — Z8711 Personal history of peptic ulcer disease: Secondary | ICD-10-CM | POA: Insufficient documentation

## 2023-07-20 DIAGNOSIS — I899 Noninfective disorder of lymphatic vessels and lymph nodes, unspecified: Secondary | ICD-10-CM

## 2023-07-20 DIAGNOSIS — R768 Other specified abnormal immunological findings in serum: Secondary | ICD-10-CM | POA: Diagnosis not present

## 2023-07-20 DIAGNOSIS — K2970 Gastritis, unspecified, without bleeding: Secondary | ICD-10-CM | POA: Diagnosis not present

## 2023-07-20 DIAGNOSIS — I10 Essential (primary) hypertension: Secondary | ICD-10-CM | POA: Insufficient documentation

## 2023-07-20 DIAGNOSIS — R1084 Generalized abdominal pain: Secondary | ICD-10-CM

## 2023-07-20 DIAGNOSIS — K8500 Idiopathic acute pancreatitis without necrosis or infection: Secondary | ICD-10-CM

## 2023-07-20 DIAGNOSIS — Z87891 Personal history of nicotine dependence: Secondary | ICD-10-CM | POA: Insufficient documentation

## 2023-07-20 DIAGNOSIS — G4730 Sleep apnea, unspecified: Secondary | ICD-10-CM | POA: Diagnosis not present

## 2023-07-20 DIAGNOSIS — K859 Acute pancreatitis without necrosis or infection, unspecified: Secondary | ICD-10-CM | POA: Diagnosis not present

## 2023-07-20 DIAGNOSIS — R896 Abnormal cytological findings in specimens from other organs, systems and tissues: Secondary | ICD-10-CM | POA: Diagnosis not present

## 2023-07-20 DIAGNOSIS — K449 Diaphragmatic hernia without obstruction or gangrene: Secondary | ICD-10-CM | POA: Diagnosis not present

## 2023-07-20 DIAGNOSIS — Z8719 Personal history of other diseases of the digestive system: Secondary | ICD-10-CM

## 2023-07-20 DIAGNOSIS — K862 Cyst of pancreas: Secondary | ICD-10-CM

## 2023-07-20 DIAGNOSIS — K2270 Barrett's esophagus without dysplasia: Secondary | ICD-10-CM

## 2023-07-20 DIAGNOSIS — K3184 Gastroparesis: Secondary | ICD-10-CM | POA: Insufficient documentation

## 2023-07-20 HISTORY — PX: ESOPHAGOGASTRODUODENOSCOPY (EGD) WITH PROPOFOL: SHX5813

## 2023-07-20 HISTORY — PX: EUS: SHX5427

## 2023-07-20 HISTORY — DX: Gastric ulcer, unspecified as acute or chronic, without hemorrhage or perforation: K25.9

## 2023-07-20 HISTORY — PX: FINE NEEDLE ASPIRATION: SHX5430

## 2023-07-20 HISTORY — DX: Idiopathic acute pancreatitis without necrosis or infection: K85.00

## 2023-07-20 SURGERY — ESOPHAGOGASTRODUODENOSCOPY (EGD) WITH PROPOFOL
Anesthesia: Monitor Anesthesia Care

## 2023-07-20 MED ORDER — SIMETHICONE 40 MG/0.6ML PO SUSP (UNIT DOSE)
ORAL | Status: AC
  Filled 2023-07-20: qty 1.8

## 2023-07-20 MED ORDER — SIMETHICONE 40 MG/0.6ML PO SUSP
160.0000 mg | Freq: Once | ORAL | Status: AC
  Administered 2023-07-20: 160 mg via ORAL

## 2023-07-20 MED ORDER — SODIUM CHLORIDE 0.9 % IV SOLN: INTRAVENOUS | Status: DC

## 2023-07-20 MED ORDER — PROPOFOL 500 MG/50ML IV EMUL
INTRAVENOUS | Status: DC | PRN
  Administered 2023-07-20: 150 ug/kg/min via INTRAVENOUS

## 2023-07-20 MED ORDER — PROPOFOL 500 MG/50ML IV EMUL
INTRAVENOUS | Status: AC
  Filled 2023-07-20: qty 100

## 2023-07-20 MED ORDER — SIMETHICONE 40 MG/0.6ML PO SUSP (UNIT DOSE)
ORAL | Status: AC
  Filled 2023-07-20: qty 0.6

## 2023-07-20 MED ORDER — PROPOFOL 10 MG/ML IV BOLUS
INTRAVENOUS | Status: DC | PRN
  Administered 2023-07-20: 20 mg via INTRAVENOUS
  Administered 2023-07-20: 40 mg via INTRAVENOUS

## 2023-07-20 SURGICAL SUPPLY — 14 items
BLOCK BITE 60FR ADLT L/F BLUE (MISCELLANEOUS) ×1 IMPLANT
ELECT REM PT RETURN 9FT ADLT (ELECTROSURGICAL)
ELECTRODE REM PT RTRN 9FT ADLT (ELECTROSURGICAL) IMPLANT
FORCEP RJ3 GP 1.8X160 W-NEEDLE (CUTTING FORCEPS) IMPLANT
FORCEPS BIOP RAD 4 LRG CAP 4 (CUTTING FORCEPS) IMPLANT
NDL SCLEROTHERAPY 25GX240 (NEEDLE) IMPLANT
NEEDLE SCLEROTHERAPY 25GX240 (NEEDLE)
PROBE APC STR FIRE (PROBE) IMPLANT
PROBE INJECTION GOLD 7FR (MISCELLANEOUS) IMPLANT
SNARE SHORT THROW 13M SML OVAL (MISCELLANEOUS) IMPLANT
SYR 50ML LL SCALE MARK (SYRINGE) IMPLANT
TUBING ENDO SMARTCAP PENTAX (MISCELLANEOUS) ×2 IMPLANT
TUBING IRRIGATION ENDOGATOR (MISCELLANEOUS) ×1 IMPLANT
WATER STERILE IRR 1000ML POUR (IV SOLUTION) IMPLANT

## 2023-07-20 NOTE — Anesthesia Preprocedure Evaluation (Addendum)
Anesthesia Evaluation  Patient identified by MRN, date of birth, ID band Patient awake    Reviewed: Allergy & Precautions, NPO status , Patient's Chart, lab work & pertinent test results  History of Anesthesia Complications Negative for: history of anesthetic complications  Airway Mallampati: III  TM Distance: >3 FB Neck ROM: Full    Dental  (+) Dental Advisory Given   Pulmonary neg shortness of breath, sleep apnea (patient denies) , neg COPD, neg recent URI, former smoker   Pulmonary exam normal breath sounds clear to auscultation       Cardiovascular hypertension (lisinopril), Pt. on medications (-) angina + CAD and + Cardiac Stents  (-) CABG + dysrhythmias Supra Ventricular Tachycardia and Ventricular Tachycardia  Rhythm:Regular Rate:Normal  HLD  TTE 09/25/2022: IMPRESSIONS    1. Left ventricular ejection fraction, by estimation, is 60 to 65%. Left  ventricular ejection fraction by 3D volume is 57 %. The left ventricle has  normal function. The left ventricle has no regional wall motion  abnormalities. Left ventricular diastolic   parameters are indeterminate. The average left ventricular global  longitudinal strain is -23.3 %. The global longitudinal strain is normal.   2. Right ventricular systolic function is normal. The right ventricular  size is normal.   3. Left atrial size was mildly dilated.   4. The mitral valve is normal in structure. No evidence of mitral valve  regurgitation. No evidence of mitral stenosis.   5. The aortic valve is normal in structure. Aortic valve regurgitation is  not visualized. No aortic stenosis is present.   6. The inferior vena cava is normal in size with greater than 50%  respiratory variability, suggesting right atrial pressure of 3 mmHg.   LHC 09/08/2021:   Prox RCA lesion is 40% stenosed.   1st Mrg lesion is 40% stenosed.   Mid LAD lesion is 80% stenosed.   Dist LAD lesion is  90% stenosed.   A drug-eluting stent was successfully placed using a STENT ONYX FRONTIER 3.0X30.   Post intervention, there is a 0% residual stenosis.   1.  Severe proximal to mid LAD calcific stenosis hemodynamic significance confirmed by pressure wire analysis, treated successfully with intracoronary stenting using a single 3.0 x 30 mm Onyx frontier DES 2.  Mild diffuse nonobstructive plaquing involving the left circumflex and RCA with no high-grade stenoses in those vessels. 3.  Residual severe stenosis in the apical portion of the LAD appropriate for medical therapy    Neuro/Psych  Headaches, neg Seizures  Neuromuscular disease (neuropathy)    GI/Hepatic Neg liver ROS, hiatal hernia, PUD,GERD (Barrett's esophagus)  Medicated,,Recurrent pancreatitis   Endo/Other  negative endocrine ROS    Renal/GU negative Renal ROS   BPH, h/o prostate cancer    Musculoskeletal   Abdominal   Peds  Hematology  (+) Blood dyscrasia, anemia Lab Results      Component                Value               Date                      WBC                      6.2                 05/11/2023  HGB                      14.0                05/11/2023                HCT                      41.8                05/11/2023                MCV                      90.1                05/11/2023                PLT                      181.0               05/11/2023              Anesthesia Other Findings   Reproductive/Obstetrics                             Anesthesia Physical Anesthesia Plan  ASA: 3  Anesthesia Plan: MAC   Post-op Pain Management:    Induction: Intravenous  PONV Risk Score and Plan: 1 and Propofol infusion, TIVA and Treatment may vary due to age or medical condition  Airway Management Planned: Natural Airway and Simple Face Mask  Additional Equipment:   Intra-op Plan:   Post-operative Plan:   Informed Consent: I have reviewed the  patients History and Physical, chart, labs and discussed the procedure including the risks, benefits and alternatives for the proposed anesthesia with the patient or authorized representative who has indicated his/her understanding and acceptance.     Dental advisory given  Plan Discussed with: CRNA and Anesthesiologist  Anesthesia Plan Comments: (Discussed with patient risks of MAC including, but not limited to, minor pain or discomfort, hearing people in the room, and possible need for backup general anesthesia. Risks for general anesthesia also discussed including, but not limited to, sore throat, hoarse voice, chipped/damaged teeth, injury to vocal cords, nausea and vomiting, allergic reactions, lung infection, heart attack, stroke, and death. All questions answered. )        Anesthesia Quick Evaluation

## 2023-07-20 NOTE — Anesthesia Postprocedure Evaluation (Signed)
Anesthesia Post Note  Patient: Dean Guzman  Procedure(s) Performed: ESOPHAGOGASTRODUODENOSCOPY (EGD) WITH PROPOFOL UPPER ENDOSCOPIC ULTRASOUND (EUS) RADIAL FINE NEEDLE ASPIRATION (FNA) LINEAR     Patient location during evaluation: PACU Anesthesia Type: MAC Level of consciousness: awake Pain management: pain level controlled Vital Signs Assessment: post-procedure vital signs reviewed and stable Respiratory status: spontaneous breathing, nonlabored ventilation and respiratory function stable Cardiovascular status: stable and blood pressure returned to baseline Postop Assessment: no apparent nausea or vomiting Anesthetic complications: no   No notable events documented.  Last Vitals:  Vitals:   07/20/23 1440 07/20/23 1450  BP: (!) 119/55 (!) 129/54  Pulse: (!) 57 (!) 51  Resp: 15 12  Temp:    SpO2: 99% 96%    Last Pain:  Vitals:   07/20/23 1450  TempSrc:   PainSc: 0-No pain                 Linton Rump

## 2023-07-20 NOTE — Anesthesia Procedure Notes (Signed)
Procedure Name: MAC Date/Time: 07/20/2023 1:33 PM  Performed by: Vanessa Harlem, CRNAPre-anesthesia Checklist: Patient identified, Emergency Drugs available, Suction available and Patient being monitored Patient Re-evaluated:Patient Re-evaluated prior to induction Oxygen Delivery Method: Nasal cannula

## 2023-07-20 NOTE — Discharge Instructions (Signed)

## 2023-07-20 NOTE — Op Note (Signed)
[facility] Patient Name: Dean Guzman Procedure Date: 07/20/2023 MRN: [id number] Attending MD: Corliss Parish , MD, [id number] Date of Birth: June 11, 1950 CSN: [id number] Age: 73 Admit Type: Outpatient Procedure:                Upper EUS Indications:              Abnormal lab work, Acute pancreatitis Providers:                Corliss Parish, MD, Doristine Mango, RN, Alan Ripper, Technician Referring MD:              Medicines:                Monitored Anesthesia Care Complications:            No immediate complications. Estimated Blood Loss:     Estimated blood loss was minimal. Procedure:                Pre-Anesthesia Assessment:                           - Prior to the procedure, a History and Physical                            was performed, and patient medications and                            allergies were reviewed. The patient's tolerance of                            previous anesthesia was also reviewed. The risks                            and benefits of the procedure and the sedation                            options and risks were discussed with the patient.                            All questions were answered, and informed consent                            was obtained. Prior Anticoagulants: The patient has                            taken no anticoagulant or antiplatelet agents                            except for aspirin. ASA Grade Assessment: III - A                            patient with severe systemic disease. After  reviewing the risks and benefits, the patient was                            deemed in satisfactory condition to undergo the                            procedure.                           After obtaining informed consent, the endoscope was                            passed under direct vision. Throughout the                            procedure, the patient's blood  pressure, pulse, and                            oxygen saturations were monitored continuously. The                            [id number]-[id number] ([id number]) Olympus endoscope was introduced                            through the mouth, and advanced to the second part                            of duodenum. The [id number]-[id number] ([id number]) Olympus                            duodenoscope was introduced through the mouth, and                            advanced to the area of papilla. The [id number]                            ([id number]) Olympus linear ultrasound scope was                            introduced through the mouth, and advanced to the                            duodenum for ultrasound examination from the                            stomach and duodenum. The upper EUS was                            accomplished without difficulty. The patient                            tolerated the procedure. Scope In: Scope Out: Findings:      ENDOSCOPIC FINDING: :      No gross lesions were noted in the proximal esophagus and  in the mid       esophagus.      There were esophageal mucosal changes consistent with long-segment       Barrett's esophagus present in the distal esophagus. The maximum       longitudinal extent of these mucosal changes was 5 cm in length.      A 2 cm hiatal hernia was present.      A medium healed ulcer was found in the gastric fundus.      Localized moderately erythematous mucosa was found in the cardia and in       the gastric antrum.      No other gross lesions were noted in the entire examined stomach.      No gross lesions were noted in the duodenal bulb, in the first portion       of the duodenum and in the second portion of the duodenum.      The major papilla was normal.      ENDOSONOGRAPHIC FINDING: :      Pancreatic parenchymal abnormalities were noted. The duct of the       pancreas measured the pancreatic head (PD = 2.1 mm), genu of the       pancreas (PD = 1.5 mm),  pancreatic body (PD = 1.6 mm), pancreatic tail       (PD = 1.0 mm) and uncinate process of the pancreas. These consisted of       hyperechoic foci with shadowing and cysts more so in the body/tail       region. As a result of the continued elevation of IgG4 with his prior       episodes of idiopathic pancreatitis, decision was made to proceed with       fine needle biopsy. Color Doppler imaging was utilized prior to needle       puncture to confirm a lack of significant vascular structures within the       needle path. Five passes were made with the 22 gauge Acquire biopsy       needle using a transgastric approach. A visible core of tissue was       obtained. Preliminary cytologic examination and touch preps were       performed. Final cytology results are pending.      There was no sign of significant endosonographic abnormality in the       common bile duct (3.3 mm) and in the common hepatic duct (6.4 mm). An       unremarkable gallbladder was identified.      Endosonographic imaging of the ampulla showed no intramural       (subepithelial) lesion.      Endosonographic imaging in the visualized portion of the liver showed no       mass.      No malignant-appearing lymph nodes were visualized in the celiac region       (level 20), peripancreatic region and porta hepatis region.      The celiac region was visualized. Impression:               EGD impression:                           - No gross lesions in the proximal esophagus and in  the mid esophagus.                           - Esophageal mucosal changes consistent with                            long-segment Barrett's esophagus found distally.                           - 2 cm hiatal hernia.                           - Scar in the gastric fundus.                           - Erythematous mucosa in the cardia and antrum. No                            other gross lesions in the entire stomach.                            - No gross lesions in the duodenal bulb, in the                            first portion of the duodenum and in the second                            portion of the duodenum.                           - Normal major papilla.                           EUS impression:                           - Pancreatic parenchymal abnormalities consisting                            of hyperechoic foci, cysts and hyperechoic strands                            were noted in the pancreatic head, genu of the                            pancreas, pancreatic body, pancreatic tail and                            uncinate process of the pancreas. Fine-needle                            biopsy performed to rule out autoimmune                            pancreatitis in setting of persistently elevated  IgG4 levels and and prior history of idiopathic                            pancreatitis. His changes of the pancreas are not                            consistent with overt chronic pancreatitis at this                            time.                           - There was no sign of significant pathology in the                            common bile duct and in the common hepatic duct.                           - No malignant-appearing lymph nodes were                            visualized in the celiac region (level 20),                            peripancreatic region and porta hepatis region. Moderate Sedation:      Not Applicable - Patient had care per Anesthesia. Recommendation:           - The patient will be observed post-procedure,                            until all discharge criteria are met.                           - Discharge patient to home.                           - Patient has a contact number available for                            emergencies. The signs and symptoms of potential                            delayed complications were discussed with the                             patient. Return to normal activities tomorrow.                            Written discharge instructions were provided to the                            patient.                           - Low fat diet  for next 3 to 4 days and then back                            to prior diet.                           - Follow-up cytology.                           - Continue current present medications otherwise.                           - Monitor for signs or symptoms of pancreatitis,                            perforation, bleeding, infection. Call if questions                            arise at any time.                           - The findings and recommendations were discussed                            with the patient.                           - The findings and recommendations were discussed                            with the patient's family. Procedure Code(s):        --- Professional ---                           [phone], Esophagogastroduodenoscopy, flexible,                            transoral; with transendoscopic ultrasound-guided                            intramural or transmural fine needle                            aspiration/biopsy(s), (includes endoscopic                            ultrasound examination limited to the esophagus,                            stomach or duodenum, and adjacent structures) Diagnosis Code(s):        --- Professional ---                           K22.89, Other specified disease of esophagus                           K44.9, Diaphragmatic hernia without obstruction  or                            gangrene                           K31.89, Other diseases of stomach and duodenum                           K86.2, Cyst of pancreas                           K86.9, Disease of pancreas, unspecified                           I89.9, Noninfective disorder of lymphatic vessels                            and lymph nodes, unspecified                            R79.9, Abnormal finding of blood chemistry,                            unspecified                           K85.90, Acute pancreatitis without necrosis or                            infection, unspecified CPT copyright 2022 American Medical Association. All rights reserved. The codes documented in this report are preliminary and upon coder review may  be revised to meet current compliance requirements. Corliss Parish, MD 07/20/2023 2:54:13 PM Number of Addenda: 0

## 2023-07-20 NOTE — H&P (Signed)
GASTROENTEROLOGY PROCEDURE H&P NOTE   Primary Care Physician: Sharlene Dory, DO  HPI: Dean Guzman is a 73 y.o. male who presents for EGD/EUS to follow-up previous gastric ulcer, gastroparesis, recurrent idiopathic pancreatitis with elevated IgG4 with likely pancreatic biopsies.  Past Medical History:  Diagnosis Date   Barrett's esophagus    BPH (benign prostatic hyperplasia)    Colon polyps    Complication of anesthesia    during biopsy, felt hot and sweating   Coronary artery disease    stent 4 2023   FH: factor V Leiden mutation    pt tested negatiove    niece died of leiden factor five complications, sister has   Frequent headaches    GERD (gastroesophageal reflux disease)    History of hiatal hernia    History of kidney stones    Hyperlipidemia    Hypertension    Migraines     no recent migraines had visual aura   Neuromuscular disorder (HCC)    neuropathy feet   Pancreatitis    Pneumonia    Prostate cancer (HCC)    Sleep apnea    no current issues no longer CPAP machine   Past Surgical History:  Procedure Laterality Date   BIOPSY  05/15/2023   Procedure: BIOPSY;  Surgeon: Lemar Lofty., MD;  Location: [facility];  Service: Gastroenterology;;   colonscopy     CORONARY ANGIOGRAPHY N/A 09/08/2021   Procedure: CORONARY ANGIOGRAPHY;  Surgeon: Tonny Bollman, MD;  Location: [facility]VASIVE CV LAB;  Service: Cardiovascular;  Laterality: N/A;   CORONARY PRESSURE/FFR STUDY N/A 09/08/2021   Procedure: INTRAVASCULAR PRESSURE WIRE/FFR STUDY;  Surgeon: Tonny Bollman, MD;  Location: [facility] INVASIVE CV LAB;  Service: Cardiovascular;  Laterality: N/A;   CORONARY STENT INTERVENTION N/A 09/08/2021   Procedure: CORONARY STENT INTERVENTION;  Surgeon: Tonny Bollman, MD;  Location: [facility] INVASIVE CV LAB;  Service: Cardiovascular;  Laterality: N/A;   CYSTOSCOPY N/A 01/13/2020   Procedure: CYSTOSCOPY FLEXIBLE;  Surgeon: Crista Elliot, MD;  Location: [facility];  Service: Urology;  Laterality: N/A;  NO SEEDS FOUND IN BLADDER   CYSTOSCOPY/URETEROSCOPY/HOLMIUM LASER/STENT PLACEMENT Left 06/15/2022   Procedure: CYSTOSCOPY LEFT URETEROSCOPY/HOLMIUM LASER/STENT PLACEMENT;  Surgeon: Crista Elliot, MD;  Location: [facility];  Service: Urology;  Laterality: Left;  1 HR FOR CASE   ESOPHAGEAL MANOMETRY N/A 04/28/2022   Procedure: ESOPHAGEAL MANOMETRY (EM);  Surgeon: Charlott Rakes, MD;  Location: [facility];  Service: Gastroenterology;  Laterality: N/A;   ESOPHAGOGASTRODUODENOSCOPY (EGD) WITH PROPOFOL N/A 05/15/2023   Procedure: ESOPHAGOGASTRODUODENOSCOPY (EGD) WITH PROPOFOL;  Surgeon: Meridee Score Netty Starring., MD;  Location: [facility];  Service: Gastroenterology;  Laterality: N/A;   EUS N/A 05/15/2023   Procedure: UPPER ENDOSCOPIC ULTRASOUND (EUS) RADIAL;  Surgeon: Lemar Lofty., MD;  Location: [facility];  Service: Gastroenterology;  Laterality: N/A;   EXTRACORPOREAL SHOCK WAVE LITHOTRIPSY Left 06/10/2022   Procedure: LEFT EXTRACORPOREAL SHOCK WAVE LITHOTRIPSY (ESWL);  Surgeon: Belva Agee, MD;  Location: [facility];  Service: Urology;  Laterality: Left;   EYE SURGERY     Lasik   MASS EXCISION Left 02/08/2021   Procedure: EXCISION MASS OF MUCOID CYST WITH DISTAL INTERPHANGEAL JOINT ARTHROTOMY LEFT MIDDLE FINGER;  Surgeon: Betha Loa, MD;  Location: [facility];  Service: Orthopedics;  Laterality: Left;   NASAL ENDOSCOPY     PROSTATE BIOPSY  may 2021 and 2020   RADIOACTIVE SEED IMPLANT N/A 01/13/2020   Procedure: RADIOACTIVE SEED IMPLANT/BRACHYTHERAPY IMPLANT;  Surgeon: Crista Elliot, MD;  Location: [facility];  Service: Urology;  Laterality: N/A;    73 SEEDS IMPLANTED   SPACE OAR INSTILLATION N/A 01/13/2020   Procedure: SPACE OAR INSTILLATION;  Surgeon: Crista Elliot, MD;  Location: [facility];  Service: Urology;  Laterality: N/A;   XI ROBOTIC  ASSISTED HIATAL HERNIA REPAIR N/A 05/19/2022   Procedure: XI ROBOTIC ASSISTED REPAIR OF PARAESOPHAGEAL HIATAL HERNIA WITH FUNDOPLICATION;  Surgeon: Karie Soda, MD;  Location: [facility];  Service: General;  Laterality: N/A;   Current Facility-Administered Medications  Medication Dose Route Frequency Provider Last Rate Last Admin   0.9 %  sodium chloride infusion   Intravenous Continuous Mansouraty, Netty Starring., MD   New Bag at 07/20/23 1306    Current Facility-Administered Medications:    0.9 %  sodium chloride infusion, , Intravenous, Continuous, Mansouraty, Netty Starring., MD, New Bag at 07/20/23 1306 No Known Allergies Family History  Problem Relation Age of Onset   Arthritis Mother    Heart disease Father    Hyperlipidemia Father    Hypertension Father    Breast cancer Sister    Diabetes Maternal Grandmother    Hearing loss Paternal Grandmother    Early death Paternal Grandmother    Hearing loss Paternal Grandfather    Early death Paternal Grandfather    Allergic rhinitis Neg Hx    Asthma Neg Hx    Eczema Neg Hx    Urticaria Neg Hx    Prostate cancer Neg Hx    Colon cancer Neg Hx    Pancreatic cancer Neg Hx    Esophageal cancer Neg Hx    Social History   Socioeconomic History   Marital status: Married    Spouse name: Not on file   Number of children: 3   Years of education: Not on file   Highest education level: Not on file  Occupational History    Comment: full time  Tobacco Use   Smoking status: Former    Current packs/day: 0.00    Average packs/day: 1 pack/day for 12.0 years (12.0 ttl pk-yrs)    Types: Cigarettes    Start date: 05/30/1966    Quit date: 05/30/1978    Years since quitting: 45.1   Smokeless tobacco: Never  Vaping Use   Vaping status: Never Used  Substance and Sexual Activity   Alcohol use: Yes    Comment: rarely   Drug use: Not Currently   Sexual activity: Not Currently  Other Topics Concern   Not on file  Social History Narrative   Not on  file   Social Drivers of Health   Financial Resource Strain: Low Risk  (07/11/2022)   Overall Financial Resource Strain (CARDIA)    Difficulty of Paying Living Expenses: Not hard at all  Food Insecurity: No Food Insecurity (02/07/2023)   Hunger Vital Sign    Worried About Running Out of Food in the Last Year: Never true    Ran Out of Food in the Last Year: Never true  Transportation Needs: No Transportation Needs (07/11/2022)   PRAPARE - Administrator, Civil Service (Medical): No    Lack of Transportation (Non-Medical): No  Physical Activity: Sufficiently Active (07/11/2022)   Exercise Vital Sign    Days of Exercise per Week: 5 days    Minutes of Exercise per Session: 40 min  Stress: No Stress Concern Present (07/11/2022)   [employer] of Occupational Health - Occupational Stress Questionnaire  Feeling of Stress : Not at all  Social Connections: Socially Isolated (07/11/2022)   Social Connection and Isolation Panel [NHANES]    Frequency of Communication with Friends and Family: Once a week    Frequency of Social Gatherings with Friends and Family: Once a week    Attends Religious Services: Never    Database administrator or Organizations: No    Attends Banker Meetings: Never    Marital Status: Married  Catering manager Violence: Not At Risk (02/03/2023)   Received from [employer]   Intimate Partner Violence    Are you denied basic needs such as food, clothing, or medical care?: No    In the past 12 months have you been in a relationship with a person who hurts, threatens, or tries to control you?: No    Are you denied basic needs such as food, clothing, or medical care?: No    In the past 12 months have you been in a relationship with a person who hurts, threatens, or tries to control you?: No    Physical Exam: Today's Vitals   07/20/23 1310  BP: (!) 158/71  Pulse: 63  Resp: 12  SpO2: 99%  Weight: 93.9 kg  Height: 6\' 2"  (1.88 m)   PainSc: 0-No pain   Body mass index is 26.58 kg/m. GEN: NAD EYE: Sclerae anicteric ENT: MMM CV: Non-tachycardic GI: Soft, NT/ND NEURO:  Alert & Oriented x 3  Lab Results: No results for input(s): "WBC", "HGB", "HCT", "PLT" in the last 72 hours. BMET No results for input(s): "NA", "K", "CL", "CO2", "GLUCOSE", "BUN", "CREATININE", "CALCIUM" in the last 72 hours. LFT No results for input(s): "PROT", "ALBUMIN", "AST", "ALT", "ALKPHOS", "BILITOT", "BILIDIR", "IBILI" in the last 72 hours. PT/INR No results for input(s): "LABPROT", "INR" in the last 72 hours.   Impression / Plan: This is a 73 y.o.male who presents for EGD/EUS to follow-up previous gastric ulcer, gastroparesis, recurrent idiopathic pancreatitis with elevated IgG4 with likely pancreatic biopsies.  The risks of an EUS including intestinal perforation, bleeding, infection, aspiration, and medication effects were discussed as was the possibility it may not give a definitive diagnosis if a biopsy is performed.  When a biopsy of the pancreas is done as part of the EUS, there is an additional risk of pancreatitis at the rate of about 1-2%.  It was explained that procedure related pancreatitis is typically mild, although it can be severe and even life threatening, which is why we do not perform random pancreatic biopsies and only biopsy a lesion/area we feel is concerning enough to warrant the risk.  The risks and benefits of endoscopic evaluation/treatment were discussed with the patient and/or family; these include but are not limited to the risk of perforation, infection, bleeding, missed lesions, lack of diagnosis, severe illness requiring hospitalization, as well as anesthesia and sedation related illnesses.  The patient's history has been reviewed, patient examined, no change in status, and deemed stable for procedure.  The patient and/or family is agreeable to proceed.    Corliss Parish, MD [facility]oscopy Office # [phone]

## 2023-07-20 NOTE — Transfer of Care (Signed)
Immediate Anesthesia Transfer of Care Note  Patient: Dean Guzman  Procedure(s) Performed: ESOPHAGOGASTRODUODENOSCOPY (EGD) WITH PROPOFOL UPPER ENDOSCOPIC ULTRASOUND (EUS) RADIAL FINE NEEDLE ASPIRATION (FNA) LINEAR  Patient Location: PACU and Endoscopy Unit  Anesthesia Type:MAC  Level of Consciousness: drowsy  Airway & Oxygen Therapy: Patient Spontanous Breathing and Patient connected to face mask oxygen  Post-op Assessment: Report given to RN and Post -op Vital signs reviewed and stable  Post vital signs: Reviewed and stable  Last Vitals:  Vitals Value Taken Time  BP 136/57 07/20/23 1433  Temp    Pulse 52 07/20/23 1434  Resp 14 07/20/23 1433  SpO2 100 % 07/20/23 1434  Vitals shown include unfiled device data.  Last Pain:  Vitals:   07/20/23 1310  PainSc: 0-No pain         Complications: No notable events documented.

## 2023-07-23 ENCOUNTER — Encounter (HOSPITAL_COMMUNITY): Payer: Self-pay | Admitting: Gastroenterology

## 2023-07-25 ENCOUNTER — Encounter: Payer: Self-pay | Admitting: Gastroenterology

## 2023-07-25 LAB — CYTOLOGY - NON PAP

## 2023-08-11 ENCOUNTER — Ambulatory Visit: Payer: PPO | Admitting: Family Medicine

## 2023-08-15 ENCOUNTER — Ambulatory Visit: Payer: PPO | Admitting: Family Medicine

## 2023-08-22 ENCOUNTER — Encounter: Payer: Self-pay | Admitting: Family Medicine

## 2023-08-22 ENCOUNTER — Ambulatory Visit: Admitting: Family Medicine

## 2023-08-22 VITALS — BP 134/82 | HR 66 | Temp 97.5°F | Ht 74.0 in | Wt 211.8 lb

## 2023-08-22 DIAGNOSIS — R5383 Other fatigue: Secondary | ICD-10-CM

## 2023-08-22 DIAGNOSIS — R2689 Other abnormalities of gait and mobility: Secondary | ICD-10-CM

## 2023-08-22 DIAGNOSIS — I10 Essential (primary) hypertension: Secondary | ICD-10-CM | POA: Diagnosis not present

## 2023-08-22 DIAGNOSIS — E7800 Pure hypercholesterolemia, unspecified: Secondary | ICD-10-CM | POA: Diagnosis not present

## 2023-08-22 DIAGNOSIS — L570 Actinic keratosis: Secondary | ICD-10-CM

## 2023-08-22 LAB — LIPID PANEL
Cholesterol: 112 mg/dL (ref 0–200)
HDL: 49.8 mg/dL (ref >39.00)
LDL Cholesterol: 47 mg/dL (ref 0–99)
NonHDL: 62.15
Total CHOL/HDL Ratio: 2
Triglycerides: 75 mg/dL (ref 0.0–149.0)
VLDL: 15 mg/dL (ref 0.0–40.0)

## 2023-08-22 LAB — CBC
HCT: 44.5 % (ref 39.0–52.0)
Hemoglobin: 14.7 g/dL (ref 13.0–17.0)
MCHC: 33 g/dL (ref 30.0–36.0)
MCV: 90.8 fl (ref 78.0–100.0)
Platelets: 151 10*3/uL (ref 150.0–400.0)
RBC: 4.9 Mil/uL (ref 4.22–5.81)
RDW: 13.8 % (ref 11.5–15.5)
WBC: 5 10*3/uL (ref 4.0–10.5)

## 2023-08-22 LAB — COMPREHENSIVE METABOLIC PANEL
ALT: 23 U/L (ref 0–53)
AST: 20 U/L (ref 0–37)
Albumin: 4.5 g/dL (ref 3.5–5.2)
Alkaline Phosphatase: 66 U/L (ref 39–117)
BUN: 17 mg/dL (ref 6–23)
CO2: 32 meq/L (ref 19–32)
Calcium: 9.8 mg/dL (ref 8.4–10.5)
Chloride: 100 meq/L (ref 96–112)
Creatinine, Ser: 1.05 mg/dL (ref 0.40–1.50)
GFR: 70.7 mL/min (ref >60.00)
Glucose, Bld: 94 mg/dL (ref 70–99)
Potassium: 4.9 meq/L (ref 3.5–5.1)
Sodium: 139 meq/L (ref 135–145)
Total Bilirubin: 0.5 mg/dL (ref 0.2–1.2)
Total Protein: 6.7 g/dL (ref 6.0–8.3)

## 2023-08-22 LAB — TSH: TSH: 1.07 u[IU]/mL (ref 0.35–5.50)

## 2023-08-22 NOTE — Progress Notes (Signed)
 Chief Complaint  Patient presents with   Acute Visit    Patient presents today for skin check.    Subjective Dean Guzman is a 73 y.o. male who presents for hypertension follow up. Dean Guzman does monitor home blood pressures. Blood pressures ranging from 120's/70's on average. Dean Guzman is compliant with medications-lisinopril 5 mg daily, Cardizem 360 mg daily. Patient has these side effects of medication: none Dean Guzman is usually adhering to a healthy diet overall. Current exercise: walking No CP or SOB.   Balance issues several times per week. Does not have dizziness or lightheadedness. No a/w position. Eating/drinking normally. Slowly getting worse. No vision changes. Seems to deviate toward the right side a majority of the time. Has not fallen or hit his head. He feels more fatigued in later in the day.   Tremor- tremor when he holds things intermittently. Not when he is resting. Mainly his hands. No pain.   Patient has had a lesion on his right forearm that itches and hurts.  It has been there for 3 weeks.  No new lotions, soaps, topicals, or detergents.  There is no drainage.  He has not tried anything so far at home.   Past Medical History:  Diagnosis Date   Barrett's esophagus    BPH (benign prostatic hyperplasia)    Colon polyps    Complication of anesthesia    during biopsy, felt hot and sweating   Coronary artery disease    stent 4 2023   FH: factor V Leiden mutation    pt tested negatiove    niece died of leiden factor five complications, sister has   Frequent headaches    GERD (gastroesophageal reflux disease)    History of hiatal hernia    History of kidney stones    Hyperlipidemia    Hypertension    Migraines     no recent migraines had visual aura   Neuromuscular disorder (HCC)    neuropathy feet   Pancreatitis    Pneumonia    Prostate cancer (HCC)    Sleep apnea    no current issues no longer CPAP machine    Exam BP 134/82   Pulse 66   Temp (!) 97.5 F  (36.4 C)   Ht 6\' 2"  (1.88 m)   Wt 211 lb 12.8 oz (96.1 kg)   SpO2 96%   BMI 27.19 kg/m  General:  well developed, well nourished, in no apparent distress Skin: Scaly lesion slightly hyperpigmented on the dorsum of his right forearm midshaft.  There is no erythema, fluctuance, excoriation, or drainage.  Slight TTP. Neuro: Intention tremor noted of right hand.  5/5 strength in the left lower extremity, 4/5 strength with knee extension and hip flexion.  Gait is cautious. Heart: RRR, no bruits, no LE edema Lungs: clear to auscultation, no accessory muscle use Psych: well oriented with normal range of affect and appropriate judgment/insight  Procedure note: cryotherapy Verbal consent obtained 1 skin lesion treated Liquid nitrogen was applied via a thin spray creating an ice ball with 1-2 mm corona surrounding the lesion The patient tolerated the procedure well There were no immediate complications noted  Essential hypertension - Plan: CBC, Comprehensive metabolic panel, Lipid panel  Hypercholesterolemia  Fatigue, unspecified type - Plan: TSH  Balance disorder - Plan: Ambulatory referral to Physical Therapy  Actinic keratosis - Plan: PR DESTRUCTION PREMALIGNANT LESION 1ST  Chronic, stable.  Continue lisinopril 5 mg daily, Cardizem 360 mg daily.  Counseled on diet and exercise. Chronic, stable.  Continue Crestor 20 mg daily. Could be age-related changes.  Check TSH. Refer to physical therapy. Cryotherapy today.  If no improvement in the next week or so, he will let us know and we will consider shave biopsy. F/u in 6 months. The patient voiced understanding and agreement to the plan.  [name] [place], DO 08/22/23  12:19 PM

## 2023-08-22 NOTE — Patient Instructions (Addendum)
 Give [place] 2-3 business days to get the results of your labs back.   Keep the diet clean and stay active.  Aim to do some physical exertion for 150 minutes per week. This is typically divided into 5 days per week, 30 minutes per day. The activity should be enough to get your heart rate up. Anything is better than nothing if you have time constraints.  Please consider adding some weight resistance exercise to your routine. Consider yoga as well.   If you do not hear anything about your referral in the next 1-2 weeks, call our office and ask for an update.  I expect the lesion on your forearm to turn dark and peel away. If you have it next week, come see [place] for a biopsy.   Let us know if you need anything.

## 2023-08-25 ENCOUNTER — Encounter: Payer: Self-pay | Admitting: Internal Medicine

## 2023-09-01 DIAGNOSIS — M25551 Pain in right hip: Secondary | ICD-10-CM | POA: Diagnosis not present

## 2023-09-01 DIAGNOSIS — R531 Weakness: Secondary | ICD-10-CM | POA: Diagnosis not present

## 2023-09-08 DIAGNOSIS — M25551 Pain in right hip: Secondary | ICD-10-CM | POA: Diagnosis not present

## 2023-09-08 DIAGNOSIS — R531 Weakness: Secondary | ICD-10-CM | POA: Diagnosis not present

## 2023-09-13 ENCOUNTER — Other Ambulatory Visit: Payer: Self-pay | Admitting: Cardiology

## 2023-09-15 DIAGNOSIS — R531 Weakness: Secondary | ICD-10-CM | POA: Diagnosis not present

## 2023-09-15 DIAGNOSIS — M25551 Pain in right hip: Secondary | ICD-10-CM | POA: Diagnosis not present

## 2023-09-18 ENCOUNTER — Ambulatory Visit: Payer: PPO | Attending: Internal Medicine | Admitting: Physical Therapy

## 2023-09-18 ENCOUNTER — Other Ambulatory Visit: Payer: Self-pay

## 2023-09-18 DIAGNOSIS — R293 Abnormal posture: Secondary | ICD-10-CM | POA: Diagnosis not present

## 2023-09-18 DIAGNOSIS — R279 Unspecified lack of coordination: Secondary | ICD-10-CM | POA: Diagnosis not present

## 2023-09-18 DIAGNOSIS — M6281 Muscle weakness (generalized): Secondary | ICD-10-CM | POA: Insufficient documentation

## 2023-09-18 DIAGNOSIS — C61 Malignant neoplasm of prostate: Secondary | ICD-10-CM | POA: Insufficient documentation

## 2023-09-18 NOTE — Therapy (Signed)
 OUTPATIENT PHYSICAL THERAPY MALE PELVIC EVALUATION   Patient Name: Dean Guzman MRN: [id number] DOB:Jun 19, 1950, 73 y.o., male Today's Date: 09/18/2023  END OF SESSION:  PT End of Session - 09/18/23 0929     Visit Number 1    Date for PT Re-Evaluation 03/19/24    Authorization Type [employer]    PT Start Time 0930    PT Stop Time 1001    PT Time Calculation (min) 31 min    Activity Tolerance Patient tolerated treatment well    Behavior During Therapy WFL for tasks assessed/performed             Past Medical History:  Diagnosis Date   Barrett's esophagus    BPH (benign prostatic hyperplasia)    Colon polyps    Complication of anesthesia    during biopsy, felt hot and sweating   Coronary artery disease    stent 4 2023   FH: factor V Leiden mutation    pt tested negatiove    niece died of leiden factor five complications, sister has   Frequent headaches    GERD (gastroesophageal reflux disease)    History of hiatal hernia    History of kidney stones    Hyperlipidemia    Hypertension    Migraines     no recent migraines had visual aura   Neuromuscular disorder (HCC)    neuropathy feet   Pancreatitis    Pneumonia    Prostate cancer (HCC)    Sleep apnea    no current issues no longer CPAP machine   Past Surgical History:  Procedure Laterality Date   BIOPSY  05/15/2023   Procedure: BIOPSY;  Surgeon: Normie Becton., MD;  Location: [facility];  Service: Gastroenterology;;   colonscopy     CORONARY ANGIOGRAPHY N/A 09/08/2021   Procedure: CORONARY ANGIOGRAPHY;  Surgeon: Arnoldo Lapping, MD;  Location: [facility] INVASIVE CV LAB;  Service: Cardiovascular;  Laterality: N/A;   CORONARY PRESSURE/FFR STUDY N/A 09/08/2021   Procedure: INTRAVASCULAR PRESSURE WIRE/FFR STUDY;  Surgeon: Arnoldo Lapping, MD;  Location: [facility] INVASIVE CV LAB;  Service: Cardiovascular;  Laterality: N/A;   CORONARY STENT INTERVENTION N/A 09/08/2021   Procedure: CORONARY STENT  INTERVENTION;  Surgeon: Arnoldo Lapping, MD;  Location[facility] INVASIVE CV LAB;  Service: Cardiovascular;  Laterality: N/A;   CYSTOSCOPY N/A 01/13/2020   Procedure: CYSTOSCOPY FLEXIBLE;  Surgeon: Samson Croak, MD;  Location: [facility];  Service: Urology;  Laterality: N/A;  NO SEEDS FOUND IN BLADDER   CYSTOSCOPY/URETEROSCOPY/HOLMIUM LASER/STENT PLACEMENT Left 06/15/2022   Procedure: CYSTOSCOPY LEFT URETEROSCOPY/HOLMIUM LASER/STENT PLACEMENT;  Surgeon: Samson Croak, MD;  Location: [facility];  Service: Urology;  Laterality: Left;  1 HR FOR CASE   ESOPHAGEAL MANOMETRY N/A 04/28/2022   Procedure: ESOPHAGEAL MANOMETRY (EM);  Surgeon: Baldo Bonds, MD;  Location: [facility];  Service: Gastroenterology;  Laterality: N/A;   ESOPHAGOGASTRODUODENOSCOPY (EGD) WITH PROPOFOL  N/A 05/15/2023   Procedure: ESOPHAGOGASTRODUODENOSCOPY (EGD) WITH PROPOFOL ;  Surgeon: Brice Campi Albino Alu., MD;  Location: [facility];  Service: Gastroenterology;  Laterality: N/A;   ESOPHAGOGASTRODUODENOSCOPY (EGD) WITH PROPOFOL  N/A 07/20/2023   Procedure: ESOPHAGOGASTRODUODENOSCOPY (EGD) WITH PROPOFOL ;  Surgeon: Brice Campi Albino Alu., MD;  Location: [facility];  Service: Gastroenterology;  Laterality: N/A;   EUS N/A 05/15/2023   Procedure: UPPER ENDOSCOPIC ULTRASOUND (EUS) RADIAL;  Surgeon: Normie Becton., MD;  Location: [facility];  Service: Gastroenterology;  Laterality: N/A;   EUS N/A 07/20/2023   Procedure: UPPER ENDOSCOPIC ULTRASOUND (EUS) RADIAL;  Surgeon: Yong Henle  Marieta Shorten., MD;  Location: [facility];  Service: Gastroenterology;  Laterality: N/A;   EXTRACORPOREAL SHOCK WAVE LITHOTRIPSY Left 06/10/2022   Procedure: LEFT EXTRACORPOREAL SHOCK WAVE LITHOTRIPSY (ESWL);  Surgeon: Sherlyn Ditto, MD;  Location: [facility];  Service: Urology;  Laterality: Left;   EYE SURGERY     Lasik   FINE NEEDLE ASPIRATION N/A 07/20/2023   Procedure: FINE NEEDLE ASPIRATION (FNA)  LINEAR;  Surgeon: Normie Becton., MD;  Location: [facility];  Service: Gastroenterology;  Laterality: N/A;   MASS EXCISION Left 02/08/2021   Procedure: EXCISION MASS OF MUCOID CYST WITH DISTAL INTERPHANGEAL JOINT ARTHROTOMY LEFT MIDDLE FINGER;  Surgeon: Brunilda Capra, MD;  Location: [facility];  Service: Orthopedics;  Laterality: Left;   NASAL ENDOSCOPY     PROSTATE BIOPSY  may 2021 and 2020   RADIOACTIVE SEED IMPLANT N/A 01/13/2020   Procedure: RADIOACTIVE SEED IMPLANT/BRACHYTHERAPY IMPLANT;  Surgeon: Samson Croak, MD;  Location: [facility];  Service: Urology;  Laterality: N/A;    73 SEEDS IMPLANTED   SPACE OAR INSTILLATION N/A 01/13/2020   Procedure: SPACE OAR INSTILLATION;  Surgeon: Samson Croak, MD;  Location: [facility];  Service: Urology;  Laterality: N/A;   XI ROBOTIC ASSISTED HIATAL HERNIA REPAIR N/A 05/19/2022   Procedure: XI ROBOTIC ASSISTED REPAIR OF PARAESOPHAGEAL HIATAL HERNIA WITH FUNDOPLICATION;  Surgeon: Candyce Champagne, MD;  Location: [facility];  Service: General;  Laterality: N/A;   Patient Active Problem List   Diagnosis Date Noted   Idiopathic acute pancreatitis without infection or necrosis 07/20/2023   Gastric ulcer 07/20/2023   History of pancreatitis 05/15/2023   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 05/15/2023   Gastric ulcer without hemorrhage or perforation 05/15/2023   Gastritis and gastroduodenitis 05/15/2023   Anemia 02/03/2023   Ileus (HCC) 02/03/2023   History of brachytherapy 11/07/2022   Decreased anal sphincter tone 11/07/2022   Prolapsed internal hemorrhoids, grade 4 11/07/2022   Personal history of prostate cancer 11/07/2022   Dyspnea on exertion 12/21/2021   Paroxysmal ventricular tachycardia (HCC) 09/08/2021   CAD (coronary artery disease) PTCA and stenting of the mid LAD in April 2023 09/08/2021   Change in bowel habit 08/31/2021   Irritable bowel syndrome 08/31/2021   Internal hemorrhoids  08/31/2021   Imaging of gastrointestinal tract abnormal 08/31/2021   Radiation proctitis 08/31/2021   Other general symptoms and signs 08/31/2021   Mucus in stool 08/31/2021   Right lower quadrant pain 08/31/2021   Atypical chest pain 08/31/2021   Near syncope 07/21/2021   History of kidney stones 07/16/2021   Colon polyps 07/16/2021   BPH (benign prostatic hyperplasia) 07/16/2021   Barrett's esophagus without dysplasia 07/16/2021   Perennial allergic rhinitis 12/25/2018   Enteritis 08/01/2018   Ileitis 08/01/2018   Essential hypertension 04/20/2018   Hyperlipidemia 04/20/2018   Numbness of toes 04/20/2018   Cramping of feet 04/20/2018   Metabolic syndrome 11/07/2011   Sinus bradycardia 11/07/2011   H/O cluster headache 07/13/2011   Peripheral neuropathy, idiopathic 10/05/2010   Hypercholesterolemia 10/05/2010   Vitamin D  deficiency 10/05/2010    PCP: Jobe Mulder, DO   REFERRING PROVIDER: Daina Drum, MD  REFERRING DIAG: Z87.19 (ICD-10-CM) - History of pancreatitis R19.7 (ICD-10-CM) - Diarrhea, unspecified type K25.9 (ICD-10-CM) - Gastric ulcer, unspecified chronicity, unspecified whether gastric ulcer hemorrhage or perforation present K31.84 (ICD-10-CM) - Gastroparesis  THERAPY DIAG:  Prostate cancer (HCC)  Muscle weakness (generalized)  Abnormal posture  Unspecified lack of coordination  Rationale for Evaluation and Treatment: Rehabilitation  ONSET DATE: at least one year  SUBJECTIVE:                                                                                                                                                                                           SUBJECTIVE STATEMENT: Had hemorrhoid  removal and stool incontinence has improved with this but does have intermittent stool incontinence with formed small amount. Reports usually this is after having urge to empty, makes it to bathroom and has a bowel movement and then without urge  afterward will have small leakage.   Fluid intake: water - drinking throughout the day; one cup in morning, sometimes 1-2 cups daily un-sweet   PAIN:  Are you having pain? No   PRECAUTIONS: None  RED FLAGS: None   WEIGHT BEARING RESTRICTIONS: No  FALLS:  Has patient fallen in last 6 months? No  LIVING ENVIRONMENT: Lives with: lives with their family Lives in: House/apartment   OCCUPATION: semi retired - Engineer, drilling; data analyzing    PLOF: Independent  PATIENT GOALS: to have no fecal leakage  PERTINENT HISTORY:  paraesophageal hiatal hernia s/p hiatal hernia surgery and Toupet fundoplication, prostate cancer s/p radiation, CAD s/p PCI, prior pancreatitis, and Barrett's esophagus. admitted at [facility] 9/5 to 02/05/2023 for ileus and pancreatitis, NG tube (no longer in place).   Sexual abuse: NO  BOWEL MOVEMENT: Pain with bowel movement: No Type of bowel movement:Type (Bristol Stool Scale) 4, Frequency daily, and Strain no Fully empty rectum: Yes:   Leakage: Yes: intermittently but consistent  Pads: No Fiber supplement: Yes: probiotic daily, no fiber  URINATION: Pain with urination: No Fully empty bladder: Yes: most of the time, sometimes feels like I don't Stream: Strong and Weak Urgency: No Frequency: not quicker than every 2 hours; 1-2x nightly Leakage: none Pads: No  INTERCOURSE: Pain with intercourse: none Climax: not painful  Ejaculation: No   OBJECTIVE:  Note: Objective measures were completed at Evaluation unless otherwise noted.  DIAGNOSTIC FINDINGS:    COGNITION: Overall cognitive status: Within functional limits for tasks assessed     SENSATION: Light touch: Appears intact Proprioception: Appears intact  MUSCLE LENGTH: Hamstrings and adductors limited by 25% bil   FUNCTIONAL TESTS:  Squats bil valgus mildly Sit up 1/3  GAIT: WFL  POSTURE: rounded shoulders  PELVIC ALIGNMENT: WFL  LUMBARAROM/PROM:  A/PROM  A/PROM  eval  Flexion WFL  Extension WFL  Right lateral flexion Limited by 50%  Left lateral flexion Limited by 50%  Right rotation Limited by 25%  Left rotation Limited by 25%   (Blank rows =  not tested)  LOWER EXTREMITY AROM/PROM:  Hip IR/ER limited by 25% bil   LOWER EXTREMITY MMT:  Bil hip abduction 4/5; all other hips 4+/5; knees 5/5  PALPATION: GENERAL no TTP, mild lumbar paraspinal tightness. Abdominal tightness noted in lower quadrants but no TTP               External Perineal Exam deferred              Internal Pelvic Floor deferred  Patient confirms identification and approves PT to assess internal pelvic floor and treatment No May be open if needed at later appointment  PELVIC MMT:   MMT eval  Internal Anal Sphincter   External Anal Sphincter   Puborectalis   Diastasis Recti   (Blank rows = not tested)  TONE: Deferred   TODAY'S TREATMENT:                                                                                                                              DATE:   EVAL Examination completed, findings reviewed, pt educated on POC, HEP, and voiding mechanics, abdominal massage. Pt motivated to participate in PT and agreeable to attempt recommendations.     PATIENT EDUCATION:  Education details: [email or web address] Person educated: Patient Education method: Explanation, Demonstration, Tactile cues, Verbal cues, and Handouts Education comprehension: verbalized understanding, returned demonstration, verbal cues required, tactile cues required, and needs further education  HOME EXERCISE PROGRAM: Access Code: [email or web address] URL: [email or web address] Date: 09/18/2023 Prepared by: Fairy Homer  Exercises - Supine Hip Internal and External Rotation  - 1 x daily - 7 x weekly - 1 sets - 3 reps - 30s holds - Diaphragmatic Breathing in Child's Pose with Pelvic Floor Relaxation  - 1 x daily - 7 x weekly - 3 sets - 30s hold - Child's Pose with Thread the Needle  - 1  x daily - 7 x weekly - 1 sets - 3 reps - 30s holds - Seated Happy Baby With Trunk Flexion For Pelvic Relaxation  - 1 x daily - 7 x weekly - 1 sets - 3 reps - 30s holds  ASSESSMENT:  CLINICAL IMPRESSION: Patient is a 73 y.o. male  who was seen today for physical therapy evaluation and treatment for fecal incontinence. Pt has history of  prostate cancer s/p radiation, admitted at [facility] 9/5 to 02/05/2023 for ileus and pancreatitis, NG tube placed but has been removed since this. Pt reports bowels are moving more regularly now, but sometimes with urge or sometimes without will have small formed stool leakage. Pt reports he did have urinary symptoms of increased frequency, urgency but taking Gemtesa and this has resolved urine issues. Pt found to have restrictions in mobility in trunk flexibility in all directions, decreased hip strength, decreased core strength evident by squat mechanics and sit up test. Pt did have mild tension in lower abdominal quadrants but no pain. Pt educated on voiding mechanics, abdominal massage, and HEP. Pt denied  questions. Pt would benefit from additional PT to further address deficits.    OBJECTIVE IMPAIRMENTS: decreased activity tolerance, decreased coordination, decreased endurance, decreased mobility, decreased strength, increased fascial restrictions, impaired flexibility, improper body mechanics, and postural dysfunction.   ACTIVITY LIMITATIONS: continence  PARTICIPATION LIMITATIONS: community activity  PERSONAL FACTORS: Past/current experiences, Time since onset of injury/illness/exacerbation, and 1 comorbidity: medical history   are also affecting patient's functional outcome.   REHAB POTENTIAL: Good  CLINICAL DECISION MAKING: Stable/uncomplicated  EVALUATION COMPLEXITY: Low   GOALS: Goals reviewed with patient? Yes  SHORT TERM GOALS: Target date: 10/16/23  Pt to be I with HEP for carry over and continuing recommendations for improved outcomes.   Baseline: Goal status: INITIAL  2.  Pt report decreased fecal incontinence instances to no more than 1x per week for improved confidence with community outings.  Baseline:  Goal status: INITIAL  3.  Pt will be independent with use of squatty potty, relaxed toileting mechanics, and improved bowel movement techniques in order to increase ease of bowel movements and complete evacuation for decreased leakage of stool.   Baseline:  Goal status: INITIAL   LONG TERM GOALS: Target date: 03/19/24  Pt will be independent with advanced HEP for carry over and continuing recommendations for improved outcomes. , Baseline:  Goal status: INITIAL  2.  Pt to report consistent urges noticed by pt for bowel movements at least 75% of the time for improved awareness for bowel movement and emptying bowels fully for decreased fecal leakage for improved confidence with community outings. Baseline:  Goal status: INITIAL  3.  Pt to demonstrated 2/3 sit up test for improved core strength for improved pelvic stability and decreased strain at pelvic floor.  Baseline:  Goal status: INITIAL  4.  Pt report decreased fecal incontinence instances to no more than 1x per month for improved confidence with community outings.  Baseline:  Goal status: INITIAL  5.  Pt to report no urine leakage and no increased frequency of urine urges quicker than 2 hours during the day for improved confidence with community outings.  Baseline:  Goal status: INITIAL    PLAN:  PT FREQUENCY: every other week  PT DURATION:  4 sessions  PLANNED INTERVENTIONS: 97110-Therapeutic exercises, 97530- Therapeutic activity, 97112- Neuromuscular re-education, 97535- Self Care, 16109- Manual therapy, Patient/Family education, Taping, Dry Needling, Joint mobilization, Spinal mobilization, Scar mobilization, DME instructions, Cryotherapy, Moist heat, and Biofeedback  PLAN FOR NEXT SESSION: review all handouts, possible internal if needed and  pt consents.    Avie Lemme, PT, DPT 09/18/2510:04 PM

## 2023-09-18 NOTE — Patient Instructions (Signed)
   For ~3-5 mins lying down, gentle circle scoops Right to Left.

## 2023-09-22 DIAGNOSIS — M25551 Pain in right hip: Secondary | ICD-10-CM | POA: Diagnosis not present

## 2023-09-22 DIAGNOSIS — R531 Weakness: Secondary | ICD-10-CM | POA: Diagnosis not present

## 2023-10-02 ENCOUNTER — Encounter

## 2023-11-01 ENCOUNTER — Ambulatory Visit: Admitting: Internal Medicine

## 2023-11-01 ENCOUNTER — Encounter: Payer: Self-pay | Admitting: Internal Medicine

## 2023-11-01 VITALS — BP 120/70 | HR 54 | Ht 74.0 in | Wt 213.4 lb

## 2023-11-01 DIAGNOSIS — K3184 Gastroparesis: Secondary | ICD-10-CM

## 2023-11-01 DIAGNOSIS — R768 Other specified abnormal immunological findings in serum: Secondary | ICD-10-CM

## 2023-11-01 DIAGNOSIS — Z8719 Personal history of other diseases of the digestive system: Secondary | ICD-10-CM

## 2023-11-01 DIAGNOSIS — K5900 Constipation, unspecified: Secondary | ICD-10-CM | POA: Diagnosis not present

## 2023-11-01 DIAGNOSIS — R159 Full incontinence of feces: Secondary | ICD-10-CM | POA: Diagnosis not present

## 2023-11-01 DIAGNOSIS — Z860100 Personal history of colon polyps, unspecified: Secondary | ICD-10-CM

## 2023-11-01 DIAGNOSIS — K2270 Barrett's esophagus without dysplasia: Secondary | ICD-10-CM | POA: Diagnosis not present

## 2023-11-01 MED ORDER — PANTOPRAZOLE SODIUM 40 MG PO TBEC: 40.0000 mg | DELAYED_RELEASE_TABLET | Freq: Every day | ORAL | 12 refills | Status: AC

## 2023-11-01 MED ORDER — PRUCALOPRIDE SUCCINATE 2 MG PO TABS: 2.0000 mg | ORAL_TABLET | Freq: Every day | ORAL | 3 refills | Status: DC

## 2023-11-01 NOTE — Progress Notes (Signed)
 Chief Complaint: Abdominal pain, diarrhea  HPI : 73 year old male with history of paraesophageal hiatal hernia s/p hiatal hernia surgery and Toupet fundoplication, prostate cancer s/p radiation, CAD s/p PCI, prior pancreatitis, gastroparesis, and Barrett's esophagus presents for follow up of gastroparesis  He was admitted at [facility] 9/5 to 02/05/2023 for ileus and pancreatitis, which developed while he was on a cruise on his way to [place].  Patient was treated with supportive care and temporary NG tube placement.   Interval History: He stopped his Reglan . When he was on Reglan , he noticed that he was pursing his lips unintentionally. When he stopped his Reglan , the pursing of his lips stopped. He has noted that he feels like his stomach is not emptying as well off of the Reglan . Endorses nausea. Denies vomiting. His insurance no longer covers Zofran  so he stopped taking this. When he was taking Reglan , he was more regular with his BMs. He no longer has a BM every day. Abdomen gets achy every once in a while but not severe. He had benefit from pelvic floor PT and has completed his sessions  Wt Readings from Last 3 Encounters:  11/01/23 213 lb 6 oz (96.8 kg)  08/22/23 211 lb 12.8 oz (96.1 kg)  07/20/23 207 lb 0.2 oz (93.9 kg)    Past Medical History:  Diagnosis Date   Barrett's esophagus    BPH (benign prostatic hyperplasia)    Colon polyps    Complication of anesthesia    during biopsy, felt hot and sweating   Coronary artery disease    stent 4 2023   FH: factor V Leiden mutation    pt tested negatiove    niece died of leiden factor five complications, sister has   Frequent headaches    GERD (gastroesophageal reflux disease)    History of hiatal hernia    History of kidney stones    Hyperlipidemia    Hypertension    Migraines     no recent migraines had visual aura   Neuromuscular disorder (HCC)    neuropathy feet   Pancreatitis    Pneumonia    Prostate cancer (HCC)     Sleep apnea    no current issues no longer CPAP machine     Past Surgical History:  Procedure Laterality Date   BIOPSY  05/15/2023   Procedure: BIOPSY;  Surgeon: Normie Becton., MD;  Location: [facility];  Service: Gastroenterology;;   colonscopy     CORONARY ANGIOGRAPHY N/A 09/08/2021   Procedure: CORONARY ANGIOGRAPHY;  Surgeon: Arnoldo Lapping, MD;  Location: [facility] INVASIVE CV LAB;  Service: Cardiovascular;  Laterality: N/A;   CORONARY PRESSURE/FFR STUDY N/A 09/08/2021   Procedure: INTRAVASCULAR PRESSURE WIRE/FFR STUDY;  Surgeon: Arnoldo Lapping, MD;  Location: [facility] INVASIVE CV LAB;  Service: Cardiovascular;  Laterality: N/A;   CORONARY STENT INTERVENTION N/A 09/08/2021   Procedure: CORONARY STENT INTERVENTION;  Surgeon: Arnoldo Lapping, MD;  Location: [facility] INVASIVE CV LAB;  Service: Cardiovascular;  Laterality: N/A;   CYSTOSCOPY N/A 01/13/2020   Procedure: CYSTOSCOPY FLEXIBLE;  Surgeon: Samson Croak, MD;  Location: [facility];  Service: Urology;  Laterality: N/A;  NO SEEDS FOUND IN BLADDER   CYSTOSCOPY/URETEROSCOPY/HOLMIUM LASER/STENT PLACEMENT Left 06/15/2022   Procedure: CYSTOSCOPY LEFT URETEROSCOPY/HOLMIUM LASER/STENT PLACEMENT;  Surgeon: Samson Croak, MD;  Location: [facility];  Service: Urology;  Laterality: Left;  1 HR FOR CASE   ESOPHAGEAL MANOMETRY N/A 04/28/2022   Procedure: ESOPHAGEAL MANOMETRY (EM);  Surgeon: Honey Lusty,  Gayl Katos, MD;  Location: [facility];  Service: Gastroenterology;  Laterality: N/A;   ESOPHAGOGASTRODUODENOSCOPY (EGD) WITH PROPOFOL  N/A 05/15/2023   Procedure: ESOPHAGOGASTRODUODENOSCOPY (EGD) WITH PROPOFOL ;  Surgeon: Brice Campi Albino Alu., MD;  Location: [facility];  Service: Gastroenterology;  Laterality: N/A;   ESOPHAGOGASTRODUODENOSCOPY (EGD) WITH PROPOFOL  N/A 07/20/2023   Procedure: ESOPHAGOGASTRODUODENOSCOPY (EGD) WITH PROPOFOL ;  Surgeon: Brice Campi Albino Alu., MD;  Location: [facility];  Service: Gastroenterology;   Laterality: N/A;   EUS N/A 05/15/2023   Procedure: UPPER ENDOSCOPIC ULTRASOUND (EUS) RADIAL;  Surgeon: Normie Becton., MD;  Location: [facility];  Service: Gastroenterology;  Laterality: N/A;   EUS N/A 07/20/2023   Procedure: UPPER ENDOSCOPIC ULTRASOUND (EUS) RADIAL;  Surgeon: Normie Becton., MD;  Location: [facility];  Service: Gastroenterology;  Laterality: N/A;   EXTRACORPOREAL SHOCK WAVE LITHOTRIPSY Left 06/10/2022   Procedure: LEFT EXTRACORPOREAL SHOCK WAVE LITHOTRIPSY (ESWL);  Surgeon: Sherlyn Ditto, MD;  Location: [facility];  Service: Urology;  Laterality: Left;   EYE SURGERY     Lasik   FINE NEEDLE ASPIRATION N/A 07/20/2023   Procedure: FINE NEEDLE ASPIRATION (FNA) LINEAR;  Surgeon: Normie Becton., MD;  Location: [facility];  Service: Gastroenterology;  Laterality: N/A;   MASS EXCISION Left 02/08/2021   Procedure: EXCISION MASS OF MUCOID CYST WITH DISTAL INTERPHANGEAL JOINT ARTHROTOMY LEFT MIDDLE FINGER;  Surgeon: Brunilda Capra, MD;  Location: [facility];  Service: Orthopedics;  Laterality: Left;   NASAL ENDOSCOPY     PROSTATE BIOPSY  may 2021 and 2020   RADIOACTIVE SEED IMPLANT N/A 01/13/2020   Procedure: RADIOACTIVE SEED IMPLANT/BRACHYTHERAPY IMPLANT;  Surgeon: Samson Croak, MD;  Location: [facility];  Service: Urology;  Laterality: N/A;    73 SEEDS IMPLANTED   SPACE OAR INSTILLATION N/A 01/13/2020   Procedure: SPACE OAR INSTILLATION;  Surgeon: Samson Croak, MD;  Location: [facility];  Service: Urology;  Laterality: N/A;   XI ROBOTIC ASSISTED HIATAL HERNIA REPAIR N/A 05/19/2022   Procedure: XI ROBOTIC ASSISTED REPAIR OF PARAESOPHAGEAL HIATAL HERNIA WITH FUNDOPLICATION;  Surgeon: Candyce Champagne, MD;  Location: [facility];  Service: General;  Laterality: N/A;   Family History  Problem Relation Age of Onset   Arthritis Mother    Heart disease Father    Hyperlipidemia Father     Hypertension Father    Breast cancer Sister    Diabetes Maternal Grandmother    Hearing loss Paternal Grandmother    Early death Paternal Grandmother    Hearing loss Paternal Grandfather    Early death Paternal Grandfather    Allergic rhinitis Neg Hx    Asthma Neg Hx    Eczema Neg Hx    Urticaria Neg Hx    Prostate cancer Neg Hx    Colon cancer Neg Hx    Pancreatic cancer Neg Hx    Esophageal cancer Neg Hx    Social History   Tobacco Use   Smoking status: Former    Current packs/day: 0.00    Average packs/day: 1 pack/day for 12.0 years (12.0 ttl pk-yrs)    Types: Cigarettes    Start date: 05/30/1966    Quit date: 05/30/1978    Years since quitting: 45.4   Smokeless tobacco: Never  Vaping Use   Vaping status: Never Used  Substance Use Topics   Alcohol use: Yes    Comment: rarely   Drug use: Not Currently   Current Outpatient Medications  Medication Sig Dispense Refill   Prucalopride Succinate (  MOTEGRITY) 2 MG TABS Take 1 tablet (2 mg total) by mouth daily. 30 tablet 3   acetaminophen  (TYLENOL ) 325 MG tablet Take 325 mg by mouth every 6 (six) hours as needed for mild pain (pain score 1-3) or moderate pain (pain score 4-6).     aspirin  EC 81 MG tablet Take 81 mg by mouth daily. Swallow whole.     cholecalciferol  (VITAMIN D3) 25 MCG (1000 UT) tablet Take 1,000 Units by mouth daily.     diltiazem  (CARDIZEM  CD) 360 MG 24 hr capsule Take 1 capsule (360 mg total) by mouth daily. 90 capsule 2   ezetimibe  (ZETIA ) 10 MG tablet TAKE 1 TABLET BY MOUTH EVERY DAY 100 tablet 3   ipratropium (ATROVENT ) 0.06 % nasal spray SPRAY 2 SPRAYS INTO EACH NOSTRIL 3 TIMES A DAY AS NEEDED FOR RHINITIS 15 mL 3   lisinopril  (ZESTRIL ) 5 MG tablet Take 1 tablet (5 mg total) by mouth daily. 90 tablet 3   metoCLOPramide  (REGLAN ) 5 MG tablet Take 1 tablet (5 mg total) by mouth 3 (three) times daily before meals. (Patient not taking: Reported on 11/01/2023) 90 tablet 2   ondansetron  (ZOFRAN ) 4 MG tablet Take 4 mg  by mouth every 8 (eight) hours as needed for nausea or vomiting.     pantoprazole  (PROTONIX ) 40 MG tablet Take 1 tablet (40 mg total) by mouth daily. 30 tablet 12   rosuvastatin  (CRESTOR ) 20 MG tablet Take 1 tablet (20 mg total) by mouth daily. 100 tablet 2   Vibegron (GEMTESA) 75 MG TABS Take 75 mg by mouth daily.     No current facility-administered medications for this visit.   No Known Allergies  Physical Exam: BP 120/70 (BP Location: Left Arm, Patient Position: Sitting, Cuff Size: Normal)   Pulse (!) 54   Ht 6\' 2"  (1.88 m)   Wt 213 lb 6 oz (96.8 kg)   SpO2 97%   BMI 27.40 kg/m  Constitutional: Pleasant,well-developed, male in no acute distress. HEENT: Normocephalic and atraumatic. Conjunctivae are normal. No scleral icterus. Cardiovascular: Normal rate, regular rhythm.  Pulmonary/chest: Effort normal and breath sounds normal. No wheezing, rales or rhonchi. Abdominal: Soft, nondistended, non-tender. Bowel sounds active throughout. There are no masses palpable. No hepatomegaly. Extremities: No edema Neurological: Alert and oriented to person place and time. Skin: Skin is warm and dry. No rashes noted. Psychiatric: Normal mood and affect. Behavior is normal.  Labs 01/2023: CBC normal.  BMP normal.  Phosphorus normal.  Ferritin normal at 299.  Iron saturation slightly low at 8%.  Vitamin B12 normal.  TSH normal.  Folate normal.  Stool norovirus was negative.  Lipase elevated 849.  LFTs normal.  Lipid panel was normal.  Labs 02/2023: IgG4 elevated at 234. Lipase mildly elevated at 62. ANA negative. Vit D nml. GI profile negative. Pancreatic fecal elastase normal. Fecal calprotectin borderline at 71.  Labs 03/2023: IgG4 elevated at 196  Labs 04/2023: CBC nml. CMP nml. Lipase nml. CRP nml. TTG IgA negative. IgA nml. IgG4 mildly elevated at 167.  Labs 07/2023: CBC nml. CMP nml. TSH nml.   Barium esophagram 05/20/22: IMPRESSION: Expected postoperative appearance status post  fundoplication. No demonstrated leak or other complication.  CT A/P w/o contrast 06/05/22: MPRESSION: 1. Moderate left hydroureteronephrosis secondary to a 7 x 7 mm calculus at the left ureterovesical junction. 2. Sigmoid colonic diverticulosis without evidence of acute diverticulitis. 3. Postsurgical changes about the GE junction suggesting recent hernia repair. 4. Additional chronic findings as above. 5. Aortic  atherosclerosis.  CT A/P w/contrast 02/02/23: 1.  Inflammatory stranding in the region of the pancreatic head and adjacent proximal duodenum, with duodenal wall thickening. In the setting of high serum lipase, these findings likely represent duodenitis/pancreatitis.  2.  Mild distention of the small bowel loops, without definite transition point, extending just proximal to the ileocecal valve, likely secondary to ileus or slow transit, although early or partial small bowel obstruction cannot be excluded with certainty.  3.  Minimally prominent appendix without other primary or secondary signs of appendicitis, as discussed.  4.  Radiation pellets/fiducial markers in the region of the prostate, with adjacent circumferential rectal wall thickening, likely secondary to prior radiation treatment for prostate cancer.  5.  Additional adjacent pelvic fluid is nonspecific and age indeterminate, however may be secondary to some combination of the findings in impression #1, #2, and #4.  6.  Possible small side branch IPMN in the mid pancreas as described.   MRI abd/MRCP 02/16/23: IMPRESSION: Gastric distention and diffuse bowel dilatation throughout the abdomen. Pelvic bowel loops are not visualized on this exam. Differential diagnosis includes bowel obstruction and ileus. Consider abdomen pelvis CT for further evaluation. No radiographic signs of acute pancreatitis. No evidence of biliary ductal dilatation or choledocholithiasis.  RUQ U/S 03/03/23: IMPRESSION: No cholelithiasis or  sonographic evidence for acute cholecystitis.  CT A/P w/contrast 03/15/23: IMPRESSION: 1. Acute uncomplicated sigmoid diverticulitis. 2. Moderately distended stomach. 3. Aortic atherosclerosis (ICD10-I70.0).  CT A/P w/contrast 04/19/23: IMPRESSION: Resolution of sigmoid diverticulitis since prior study. No evidence of complication or other acute findings  Gastric emptying study 06/05/23: IMPRESSION: Scintigraphic findings of delayed gastric emptying.  Colonoscopy 08/07/18: Good prep. 2 small transverse and descending colon tubular adenomas (one removed with forceps, one removed with hot snare), sigmoid diverticulosis, medium sized hemorrhoids.  EGD 09/10/19: Barrett's esophagus 3 cm 36-39 cm from the incisors that was biopsied. Gastritis. Large hiatal hernia. Normal duodenum. Path: Barrett's mucosa. Negative for dysplasia.   Esophageal manometry 04/28/22: Normal and relaxation LES pressures. Intact peristalsis and complete bolus clearance on 80% of swallow. 4.6 cm hiatal hernia noted.   EGD 10/31/22: 7 cm of Barrett's esophagus in the distal esophagus that was biopsied, ulcerated congested nodular mucosa in the antrum that was biopsied, large amount of food in stomach, normal duodenum. Path: Chronic inactive gastritis. Negative for H pylori. Barrett's mucosa negative for dysplasia.  Colonoscopy 05/03/23: - The examined portion of the ileum was normal. - Diverticulosis in the sigmoid colon, in the descending colon and in the ascending colon. - Non- bleeding internal hemorrhoids. - Biopsies were taken with a cold forceps for histology in the entire colon. Path: 1. Surgical [P], random colon bxs :       COLONIC MUCOSA WITH MILD EDEMA.       NO ACTIVE INFLAMMATION, CHRONIC CHANGES, GRANULOMAS ARE OTHER PATHOLOGIC       FEATURES.       SEE NOTE.       Diagnosis Note : The differential diagnosis includes resolving infectious       colitis but there are no specific features to allow a definitive  diagnosis.       Dr. Bernetta Brilliant reviewed this case and agrees.   EUS 05/15/23: EGD impression: - No gross lesions in the proximal esophagus and in the mid esophagus. - Esophageal mucosal changes secondary to established long- segment Barrett' s disease. Biopsied as outlined above. - 1 cm hiatal hernia. - A medium amount of food ( residue) in the stomach. Suctioned  significant amount but still solid foodstuffs present. - Non- bleeding gastric ulcer noted in the fundus. Biopsied. - Severe erythematous mucosa in the cardia. Biopsied. - Moderate erythematous mucosa in the rest of the stomach. Biopsied. - No gross lesions in the duodenal bulb, in the first portion of the duodenum and in the second portion of the duodenum. Biopsied. - Normal major papilla. EUS impression: - Pancreatic parenchymal abnormalities consisting of cysts and hyperechoic foci were noted in the distal pancreatic body/ tail. - There was no sign of significant parenchymal abnormality within the pancreatic head, genu of the pancreas and proximal pancreatic body. - Main pancreatic duct ( MPD) diameter was measured. Endosonographically, the MPD had a tortuous appearance only in the tail region - There was no sign of significant pathology in the common bile duct and in the common hepatic duct. The gallbladder was normal in appearance without cholelithiasis. - No malignant- appearing lymph nodes were visualized in the left gastric region ( level 17) , gastrohepatic ligament ( level 18) , celiac region ( level 20) , peripancreatic region and porta hepatis region.  EUS 07/20/23: EGD impression: - No gross lesions in the proximal esophagus and in the mid esophagus. - Esophageal mucosal changes consistent with long- segment Barrett' s esophagus found distally. - 2 cm hiatal hernia. - Scar in the gastric fundus. - Erythematous mucosa in the cardia and antrum. No other gross lesions in the entire stomach. - No gross lesions in the duodenal bulb, in the  first portion of the duodenum and in the second portion of the duodenum. - Normal major papilla. EUS impression: - Pancreatic parenchymal abnormalities consisting of hyperechoic foci, cysts and hyperechoic strands were noted in the pancreatic head, genu of the pancreas, pancreatic body, pancreatic tail and uncinate process of the pancreas. Fine- needle biopsy performed to rule out autoimmune pancreatitis in setting of persistently elevated IgG4 levels and and prior history of idiopathic pancreatitis. His changes of the pancreas are not consistent with overt chronic pancreatitis at this time. - There was no sign of significant pathology in the common bile duct and in the common hepatic duct. - No malignant- appearing lymph nodes were visualized in the celiac region ( level 20) , peripancreatic region and porta hepatis region. Path: FINAL MICROSCOPIC DIAGNOSIS:  - No malignant cells identified   ASSESSMENT AND PLAN: Gastroparesis Diarrhea - resolved Epigastric and lower abdominal pain - resolve Constipation Fecal incontinence Elevated serum IgG4 History of pancreatitis History of ileus Barrett's esophagus History of colon polyps Patient stopped his Reglan , which was being used to treat gastroparesis, because he was starting to develop some pursing of the lips. Since this could be an extrapyramidal side effect, will go ahead and switch him to Motegrity to see if this is able to help with his constipation and gastroparesis symptoms. I asked him to use GoodRx to help with reducing the cost of Zofran  if he has any breakthrough nausea. He had an EUS with pancreas biopsies on 07/20/23 so will see if IgG4 staining can be added onto the pathology sample. -Start Motegrity 2 mg QD -Continue pantoprazole  40 mg QD -Refill Zofran . Use GoodRx -Previously completed pelvic floor PT -Messaged pathology about sending pancreas cytology from 07/20/23 for IgG4 staining -RTC 3 months. Consider erythromycin in the  future if Motegrity is not effective or is not able to be approved  Regino Caprio, MD  I spent 30 minutes of time, including in depth chart review, independent review of results as outlined above, communicating  results with the patient directly, face-to-face time with the patient, coordinating care, ordering studies and medications as appropriate, and documentation.

## 2023-11-01 NOTE — Patient Instructions (Addendum)
 We have sent the following medications to your pharmacy for you to pick up at your convenience: Pantoprazole ,Motegrity  Use Good Rx at the pharmacy to get a discount on Zofran    Follow up in 3 months  If your blood pressure at your visit was 140/90 or greater, please contact your primary care physician to follow up on this.  _______________________________________________________  If you are age 73 or older, your body mass index should be between 23-30. Your Body mass index is 27.4 kg/m. If this is out of the aforementioned range listed, please consider follow up with your Primary Care Provider.  If you are age 58 or younger, your body mass index should be between 19-25. Your Body mass index is 27.4 kg/m. If this is out of the aformentioned range listed, please consider follow up with your Primary Care Provider.   ________________________________________________________  The [facility] providers would like to encourage you to use MYCHART to communicate with providers for non-urgent requests or questions.  Due to long hold times on the telephone, sending your provider a message by [facility] may be a faster and more efficient way to get a response.  Please allow 48 business hours for a response.  Please remember that this is for non-urgent requests.  _______________________________________________________  Thank you for entrusting me with your care and for choosing [facility],  Dr. Regino Caprio

## 2023-11-03 ENCOUNTER — Encounter: Payer: Self-pay | Admitting: Internal Medicine

## 2023-11-09 ENCOUNTER — Encounter

## 2023-11-09 ENCOUNTER — Encounter (HOSPITAL_COMMUNITY): Payer: Self-pay

## 2023-11-09 ENCOUNTER — Ambulatory Visit: Payer: Self-pay | Admitting: Internal Medicine

## 2023-11-15 ENCOUNTER — Encounter: Payer: Self-pay | Admitting: Family Medicine

## 2023-11-16 ENCOUNTER — Encounter: Payer: Self-pay | Admitting: Internal Medicine

## 2023-11-16 MED ORDER — IPRATROPIUM BROMIDE 0.06 % NA SOLN: 2.0000 | Freq: Three times a day (TID) | NASAL | 1 refills | Status: DC | PRN

## 2023-11-24 ENCOUNTER — Other Ambulatory Visit: Payer: Self-pay

## 2023-11-24 ENCOUNTER — Other Ambulatory Visit: Payer: Self-pay | Admitting: Internal Medicine

## 2023-11-24 MED ORDER — AMBULATORY NON FORMULARY MEDICATION: 1 refills | Status: DC

## 2023-12-04 MED ORDER — AMBULATORY NON FORMULARY MEDICATION: 0 refills | Status: DC

## 2023-12-04 NOTE — Addendum Note (Signed)
 Addended by: Tomoya Ringwald N on: 12/04/2023 01:19 PM   Modules accepted: Orders

## 2023-12-12 DIAGNOSIS — H4321 Crystalline deposits in vitreous body, right eye: Secondary | ICD-10-CM | POA: Diagnosis not present

## 2023-12-12 DIAGNOSIS — H2513 Age-related nuclear cataract, bilateral: Secondary | ICD-10-CM | POA: Diagnosis not present

## 2023-12-15 ENCOUNTER — Other Ambulatory Visit: Payer: Self-pay | Admitting: Family Medicine

## 2023-12-19 ENCOUNTER — Ambulatory Visit (INDEPENDENT_AMBULATORY_CARE_PROVIDER_SITE_OTHER)

## 2023-12-19 VITALS — Ht 74.0 in | Wt 213.0 lb

## 2023-12-19 DIAGNOSIS — Z0000 Encounter for general adult medical examination without abnormal findings: Secondary | ICD-10-CM

## 2023-12-19 NOTE — Patient Instructions (Signed)
 Dean Guzman , Thank you for taking time out of your busy schedule to complete your Annual Wellness Visit with me. I enjoyed our conversation and look forward to speaking with you again next year. I, as well as your care team,  appreciate your ongoing commitment to your health goals. Please review the following plan we discussed and let me know if I can assist you in the future. Your Game plan/ To Do List    Follow up Visits: Next Medicare AWV with our clinical staff: In 1 year    Have you seen your provider in the last 6 months (3 months if uncontrolled diabetes)? Yes Next Office Visit with your provider: To be scheduled  Clinician Recommendations:  Aim for 30 minutes of exercise or brisk walking, 6-8 glasses of water, and 5 servings of fruits and vegetables each day.       This is a list of the screening recommended for you and due dates:  Health Maintenance  Topic Date Due   DTaP/Tdap/Td vaccine (1 - Tdap) Never done   Flu Shot  12/29/2023   Medicare Annual Wellness Visit  12/18/2024   Colon Cancer Screening  05/02/2033   Pneumococcal Vaccine for age over 31  Completed   Hepatitis C Screening  Completed   Zoster (Shingles) Vaccine  Completed   Hepatitis B Vaccine  Aged Out   HPV Vaccine  Aged Out   Meningitis B Vaccine  Aged Out   COVID-19 Vaccine  Discontinued    Advanced directives: (In Chart) A copy of your advanced directives are scanned into your chart should your provider ever need it.  Advance Care Planning is important because it:  [x]  Makes sure you receive the medical care that is consistent with your values, goals, and preferences  [x]  It provides guidance to your family and loved ones and reduces their decisional burden about whether or not they are making the right decisions based on your wishes.  Follow the link provided in your after visit summary or read over the paperwork we have mailed to you to help you started getting your Advance Directives in place. If you  need assistance in completing these, please reach out to us  so that we can help you!  See attachments for Preventive Care and Fall Prevention Tips.

## 2023-12-19 NOTE — Progress Notes (Signed)
 Subjective:   Dean Guzman is a 73 y.o. who presents for a Medicare Wellness preventive visit.  As a reminder, Annual Wellness Visits don't include a physical exam, and some assessments may be limited, especially if this visit is performed virtually. We may recommend an in-person follow-up visit with your provider if needed.  Visit Complete: Virtual I connected with  Dean Guzman on 12/19/23 by a audio enabled telemedicine application and verified that I am speaking with the correct person using two identifiers.  Patient Location: Home  Provider Location: Home Office  I discussed the limitations of evaluation and management by telemedicine. The patient expressed understanding and agreed to proceed.  Vital Signs: Because this visit was a virtual/telehealth visit, some criteria may be missing or patient reported. Any vitals not documented were not able to be obtained and vitals that have been documented are patient reported.  VideoDeclined- This patient declined Dean Guzman. Therefore the visit was completed with audio only.  Persons Participating in Visit: Patient.  AWV Questionnaire: Yes: Patient Medicare AWV questionnaire was completed by the patient on 12/12/23; I have confirmed that all information answered by patient is correct and no changes since this date.  Cardiac Risk Factors include: advanced age (>73men, >83 women);dyslipidemia;hypertension;male gender     Objective:    Today's Vitals   12/19/23 1131  Weight: 213 lb (96.6 kg)  Height: 6' 2 (1.88 m)   Body mass index is 27.35 kg/m.     12/19/2023   11:34 AM 09/18/2023    9:43 AM 07/20/2023    1:07 PM 05/15/2023    7:31 AM 07/18/2022   10:46 AM 06/15/2022   11:45 AM 06/13/2022    2:15 PM  Advanced Directives  Does Patient Have a Medical Advance Directive? Yes Yes No Yes Yes Yes Yes  Type of Estate agent of [place];Living will    Healthcare Power of  [place];Living will Healthcare Power of [place];Living will Healthcare Power of [place];Living will  Does patient want to make changes to medical advance directive? No - Patient declined No - Patient declined   No - Patient declined  No - Patient declined  Copy of Healthcare Power of Attorney in Chart? Yes - validated most recent copy scanned in chart (See row information)    Yes - validated most recent copy scanned in chart (See row information)    Would patient like information on creating a medical advance directive?   No - Patient declined        Current Medications (verified) Outpatient Encounter Medications as of 12/19/2023  Medication Sig   acetaminophen  (TYLENOL ) 325 MG tablet Take 325 mg by mouth every 6 (six) hours as needed for mild pain (pain score 1-3) or moderate pain (pain score 4-6).   AMBULATORY NON FORMULARY MEDICATION Take 10 mg (1 tablet) of domperidone by mouth three times daily.   aspirin  EC 81 MG tablet Take 81 mg by mouth daily. Swallow whole.   cholecalciferol  (VITAMIN D3) 25 MCG (1000 UT) tablet Take 1,000 Units by mouth daily.   diltiazem  (CARDIZEM  CD) 360 MG 24 hr capsule Take 1 capsule (360 mg total) by mouth daily.   ezetimibe  (ZETIA ) 10 MG tablet TAKE 1 TABLET BY MOUTH EVERY DAY   ipratropium (ATROVENT ) 0.06 % nasal spray SPRAY 2 SPRAYS INTO EACH NOSTRIL 3 TIMES A DAY AS NEEDED FOR RHINITIS   lisinopril  (ZESTRIL ) 5 MG tablet Take 1 tablet (5 mg total) by mouth daily.   ondansetron  (ZOFRAN )  4 MG tablet Take 4 mg by mouth every 8 (eight) hours as needed for nausea or vomiting.   pantoprazole  (PROTONIX ) 40 MG tablet Take 1 tablet (40 mg total) by mouth daily.   rosuvastatin  (CRESTOR ) 20 MG tablet Take 1 tablet (20 mg total) by mouth daily.   Vibegron (GEMTESA) 75 MG TABS Take 75 mg by mouth daily.   metoCLOPramide  (REGLAN ) 5 MG tablet Take 1 tablet (5 mg total) by mouth 3 (three) times daily before meals. (Patient not taking: Reported on 11/01/2023)   [DISCONTINUED]  Prucalopride Succinate  (MOTEGRITY ) 2 MG TABS Take 1 tablet (2 mg total) by mouth daily.   No facility-administered encounter medications on file as of 12/19/2023.    Allergies (verified) Patient has no known allergies.   History: Past Medical History:  Diagnosis Date   Barrett's esophagus    BPH (benign prostatic hyperplasia)    Colon polyps    Complication of anesthesia    during biopsy, felt hot and sweating   Coronary artery disease    stent 4 2023   FH: factor V Leiden mutation    pt tested negatiove    niece died of leiden factor five complications, sister has   Frequent headaches    GERD (gastroesophageal reflux disease)    History of hiatal hernia    History of kidney stones    Hyperlipidemia    Hypertension    Migraines     no recent migraines had visual aura   Neuromuscular disorder (HCC)    neuropathy feet   Pancreatitis    Pneumonia    Prostate cancer (HCC)    Sleep apnea    no current issues no longer CPAP machine   Ulcer    Past Surgical History:  Procedure Laterality Date   BIOPSY  05/15/2023   Procedure: BIOPSY;  Surgeon: Wilhelmenia Aloha Raddle., MD;  Location: [facility];  Service: Gastroenterology;;   colonscopy     CORONARY ANGIOGRAPHY N/A 09/08/2021   Procedure: CORONARY ANGIOGRAPHY;  Surgeon: Wonda Sharper, MD;  Location: [facility] INVASIVE CV LAB;  Service: Cardiovascular;  Laterality: N/A;   CORONARY ANGIOPLASTY  08/2021   CORONARY PRESSURE/FFR STUDY N/A 09/08/2021   Procedure: INTRAVASCULAR PRESSURE WIRE/FFR STUDY;  Surgeon: Wonda Sharper, MD;  Location: [facility] INVASIVE CV LAB;  Service: Cardiovascular;  Laterality: N/A;   CORONARY STENT INTERVENTION N/A 09/08/2021   Procedure: CORONARY STENT INTERVENTION;  Surgeon: Wonda Sharper, MD;  Location: [facility] INVASIVE CV LAB;  Service: Cardiovascular;  Laterality: N/A;   CYSTOSCOPY N/A 01/13/2020   Procedure: CYSTOSCOPY FLEXIBLE;  Surgeon: Carolee Sherwood JONETTA DOUGLAS, MD;  Location: [facility];   Service: Urology;  Laterality: N/A;  NO SEEDS FOUND IN BLADDER   CYSTOSCOPY/URETEROSCOPY/HOLMIUM LASER/STENT PLACEMENT Left 06/15/2022   Procedure: CYSTOSCOPY LEFT URETEROSCOPY/HOLMIUM LASER/STENT PLACEMENT;  Surgeon: Carolee Sherwood JONETTA DOUGLAS, MD;  Location: [facility];  Service: Urology;  Laterality: Left;  1 HR FOR CASE   ESOPHAGEAL MANOMETRY N/A 04/28/2022   Procedure: ESOPHAGEAL MANOMETRY (EM);  Surgeon: Dianna Specking, MD;  Location: [facility];  Service: Gastroenterology;  Laterality: N/A;   ESOPHAGOGASTRODUODENOSCOPY (EGD) WITH PROPOFOL  N/A 05/15/2023   Procedure: ESOPHAGOGASTRODUODENOSCOPY (EGD) WITH PROPOFOL ;  Surgeon: Wilhelmenia Aloha Raddle., MD;  Location: [facility];  Service: Gastroenterology;  Laterality: N/A;   ESOPHAGOGASTRODUODENOSCOPY (EGD) WITH PROPOFOL  N/A 07/20/2023   Procedure: ESOPHAGOGASTRODUODENOSCOPY (EGD) WITH PROPOFOL ;  Surgeon: Wilhelmenia Aloha Raddle., MD;  Location: [facility];  Service: Gastroenterology;  Laterality: N/A;   EUS N/A 05/15/2023   Procedure: UPPER ENDOSCOPIC ULTRASOUND (EUS)  RADIAL;  Surgeon: Wilhelmenia Aloha Raddle., MD;  Location: [facility];  Service: Gastroenterology;  Laterality: N/A;   EUS N/A 07/20/2023   Procedure: UPPER ENDOSCOPIC ULTRASOUND (EUS) RADIAL;  Surgeon: Wilhelmenia Aloha Raddle., MD;  Location: [facility];  Service: Gastroenterology;  Laterality: N/A;   EXTRACORPOREAL SHOCK WAVE LITHOTRIPSY Left 06/10/2022   Procedure: LEFT EXTRACORPOREAL SHOCK WAVE LITHOTRIPSY (ESWL);  Surgeon: Rosalind Zachary NOVAK, MD;  Location: [facility];  Service: Urology;  Laterality: Left;   EYE SURGERY     Lasik   FINE NEEDLE ASPIRATION N/A 07/20/2023   Procedure: FINE NEEDLE ASPIRATION (FNA) LINEAR;  Surgeon: Wilhelmenia Aloha Raddle., MD;  Location: [facility];  Service: Gastroenterology;  Laterality: N/A;   MASS EXCISION Left 02/08/2021   Procedure: EXCISION MASS OF MUCOID CYST WITH DISTAL INTERPHANGEAL JOINT ARTHROTOMY LEFT MIDDLE FINGER;   Surgeon: Murrell Drivers, MD;  Location: [facility];  Service: Orthopedics;  Laterality: Left;   NASAL ENDOSCOPY     PROSTATE BIOPSY  may 2021 and 2020   RADIOACTIVE SEED IMPLANT N/A 01/13/2020   Procedure: RADIOACTIVE SEED IMPLANT/BRACHYTHERAPY IMPLANT;  Surgeon: Carolee Sherwood JONETTA DOUGLAS, MD;  Location: [facility];  Service: Urology;  Laterality: N/A;    73 SEEDS IMPLANTED   SPACE OAR INSTILLATION N/A 01/13/2020   Procedure: SPACE OAR INSTILLATION;  Surgeon: Carolee Sherwood JONETTA DOUGLAS, MD;  Location: [facility];  Service: Urology;  Laterality: N/A;   XI ROBOTIC ASSISTED HIATAL HERNIA REPAIR N/A 05/19/2022   Procedure: XI ROBOTIC ASSISTED REPAIR OF PARAESOPHAGEAL HIATAL HERNIA WITH FUNDOPLICATION;  Surgeon: Sheldon Standing, MD;  Location: [facility];  Service: General;  Laterality: N/A;   Family History  Problem Relation Age of Onset   Arthritis Mother    Heart disease Father    Hyperlipidemia Father    Hypertension Father    Breast cancer Sister    Cancer Sister    Diabetes Maternal Grandmother    Hearing loss Paternal Grandmother    Early death Paternal Grandmother    Hearing loss Paternal Grandfather    Early death Paternal Grandfather    Heart attack Paternal Uncle    Allergic rhinitis Neg Hx    Asthma Neg Hx    Eczema Neg Hx    Urticaria Neg Hx    Prostate cancer Neg Hx    Colon cancer Neg Hx    Pancreatic cancer Neg Hx    Esophageal cancer Neg Hx    Social History   Socioeconomic History   Marital status: Married    Spouse name: Not on file   Number of children: 3   Years of education: Not on file   Highest education level: 12th grade  Occupational History    Comment: full time  Tobacco Use   Smoking status: Former    Current packs/day: 0.00    Average packs/day: 1 pack/day for 12.0 years (12.0 ttl pk-yrs)    Types: Cigarettes    Start date: 05/30/1966    Quit date: 05/30/1978    Years since quitting: 45.5   Smokeless tobacco: Never   Vaping Use   Vaping status: Never Used  Substance and Sexual Activity   Alcohol use: Yes    Comment: Light, occasional one ot two per month   Drug use: Not Currently   Sexual activity: Yes    Birth control/protection: None  Other Topics Concern   Not on file  Social History Narrative   Not on file   Social Drivers of Health  Financial Resource Strain: Low Risk  (12/12/2023)   Overall Financial Resource Strain (CARDIA)    Difficulty of Paying Living Expenses: Not hard at all  Food Insecurity: No Food Insecurity (12/12/2023)   Hunger Vital Sign    Worried About Running Out of Food in the Last Year: Never true    Ran Out of Food in the Last Year: Never true  Transportation Needs: No Transportation Needs (12/12/2023)   PRAPARE - Administrator, Civil Service (Medical): No    Lack of Transportation (Non-Medical): No  Physical Activity: Sufficiently Active (12/12/2023)   Exercise Vital Sign    Days of Exercise per Week: 7 days    Minutes of Exercise per Session: 30 min  Stress: No Stress Concern Present (12/12/2023)   [employer] of Occupational Health - Occupational Stress Questionnaire    Feeling of Stress: Not at all  Social Connections: Socially Isolated (12/12/2023)   Social Connection and Isolation Panel    Frequency of Communication with Friends and Family: Once a week    Frequency of Social Gatherings with Friends and Family: Once a week    Attends Religious Services: Never    Database administrator or Organizations: No    Attends Engineer, structural: Not on file    Marital Status: Married    Tobacco Counseling Counseling given: Not Answered    Clinical Intake:  Pre-visit preparation completed: Yes  Pain : No/denies pain     Diabetes: No  No results found for: HGBA1C   How often do you need to have someone help you when you read instructions, pamphlets, or other written materials from your doctor or pharmacy?: 1 -  Never  Interpreter Needed?: No  Information entered by :: Charmaine Bloodgood LPN   Activities of Daily Living     12/12/2023    9:02 AM  In your present state of health, do you have any difficulty performing the following activities:  Hearing? 0  Vision? 0  Difficulty concentrating or making decisions? 0  Walking or climbing stairs? 0  Dressing or bathing? 0  Doing errands, shopping? 0  Preparing Food and eating ? N  Using the Toilet? N  In the past six months, have you accidently leaked urine? N  Do you have problems with loss of bowel control? N  Managing your Medications? N  Managing your Finances? N  Housekeeping or managing your Housekeeping? N    Patient Care Team: Frann Mabel Mt, DO as PCP - General (Family Medicine) Sheldon Standing, MD as Consulting Physician (General Surgery) Dianna Specking, MD as Consulting Physician (Gastroenterology) Bernie Lamar PARAS, MD as Consulting Physician (Cardiology)  I have updated your Care Teams any recent Medical Services you may have received from other providers in the past year.     Assessment:   This is a routine wellness examination for [employer].  Hearing/Vision screen Hearing Screening - Comments:: Denies hearing difficulties   Vision Screening - Comments:: up to date with routine eye exams     Goals Addressed             This Visit's Progress    Maintain health and independence   On track      Depression Screen     12/19/2023   11:33 AM 02/10/2023    1:23 PM 07/18/2022   11:04 AM 12/28/2021    9:21 AM 09/14/2021    2:36 PM 06/29/2021   10:23 AM 08/17/2020    2:22 PM  PHQ  2/9 Scores  PHQ - 2 Score 0 0 0 0 0 0 0  PHQ- 9 Score  0  0       Fall Risk     12/12/2023    9:02 AM 02/10/2023    1:23 PM 07/11/2022    8:42 AM 07/01/2022    9:48 AM 12/28/2021    9:20 AM  Fall Risk   Falls in the past year? 0 0 0 0 0  Number falls in past yr: 0 0 0 0 0  Injury with Fall? 0 0 0 0 0  Risk for fall due to : No Fall  Risks No Fall Risks No Fall Risks  No Fall Risks  Follow up Falls prevention discussed;Education provided;Falls evaluation completed Falls evaluation completed Falls evaluation completed  Falls evaluation completed      Data saved with a previous flowsheet row definition    MEDICARE RISK AT HOME:  Medicare Risk at Home Any stairs in or around the home?: (Patient-Rptd) Yes If so, are there any without handrails?: (Patient-Rptd) No Home free of loose throw rugs in walkways, pet beds, electrical cords, etc?: (Patient-Rptd) No Adequate lighting in your home to reduce risk of falls?: (Patient-Rptd) Yes Life alert?: (Patient-Rptd) No Use of a cane, walker or w/c?: (Patient-Rptd) No Grab bars in the bathroom?: (Patient-Rptd) No Shower chair or bench in shower?: (Patient-Rptd) No Elevated toilet seat or a handicapped toilet?: (Patient-Rptd) No  TIMED UP AND GO:  Was the test performed?  No  Cognitive Function: Declined/Normal: No cognitive concerns noted by patient or family. Patient alert, oriented, able to answer questions appropriately and recall recent events. No signs of memory loss or confusion.        07/18/2022   11:07 AM  6CIT Screen  What Year? 0 points  What month? 0 points  What time? 0 points  Count back from 20 0 points  Months in reverse 0 points  Repeat phrase 0 points  Total Score 0 points    Immunizations Immunization History  Administered Date(s) Administered   Fluad Quad(high Dose 65+) 03/01/2020   Influenza, High Dose Seasonal PF 02/15/2021, 03/21/2022   Influenza-Unspecified 02/28/2019   PFIZER(Purple Top)SARS-COV-2 Vaccination 06/18/2019, 07/09/2019, 01/16/2020, 02/15/2021   PNEUMOCOCCAL CONJUGATE-20 06/29/2021   Pfizer(Comirnaty)Fall Seasonal Vaccine 12 years and older 02/12/2022   Pneumococcal Polysaccharide-23 12/11/2018   Zoster Recombinant(Shingrix) 08/29/2018, 10/29/2018    Screening Tests Health Maintenance  Topic Date Due   DTaP/Tdap/Td (1 -  Tdap) Never done   INFLUENZA VACCINE  12/29/2023   Medicare Annual Wellness (AWV)  12/18/2024   Colonoscopy  05/02/2033   Pneumococcal Vaccine: 50+ Years  Completed   Hepatitis C Screening  Completed   Zoster Vaccines- Shingrix  Completed   Hepatitis B Vaccines  Aged Out   HPV VACCINES  Aged Out   Meningococcal B Vaccine  Aged Out   COVID-19 Vaccine  Discontinued    Health Maintenance  Health Maintenance Due  Topic Date Due   DTaP/Tdap/Td (1 - Tdap) Never done    Additional Screening:  Vision Screening: Recommended annual ophthalmology exams for early detection of glaucoma and other disorders of the eye. Would you like a referral to an eye doctor? No    Dental Screening: Recommended annual dental exams for proper oral hygiene  Community Resource Referral / Chronic Care Management: CRR required this visit?  No   CCM required this visit?  No   Plan:    I have personally reviewed and noted the following  in the patient's chart:   Medical and social history Use of alcohol, tobacco or illicit drugs  Current medications and supplements including opioid prescriptions. Patient is not currently taking opioid prescriptions. Functional ability and status Nutritional status Physical activity Advanced directives List of other physicians Hospitalizations, surgeries, and ER visits in previous 12 months Vitals Screenings to include cognitive, depression, and falls Referrals and appointments  In addition, I have reviewed and discussed with patient certain preventive protocols, quality metrics, and best practice recommendations. A written personalized care plan for preventive services as well as general preventive health recommendations were provided to patient.   Lavelle Pfeiffer [place], [place]   [id number]/[birth year]/7974   After Visit Summary: (MyChart) Due to this being a telephonic visit, the after visit summary with patients personalized plan was offered to patient via MyChart   Notes:  Nothing significant to report at this time.

## 2023-12-25 ENCOUNTER — Other Ambulatory Visit (HOSPITAL_COMMUNITY): Payer: Self-pay

## 2023-12-25 MED ORDER — ONDANSETRON 4 MG PO TBDP: 4.0000 mg | ORAL_TABLET | Freq: Three times a day (TID) | ORAL | 0 refills | Status: AC | PRN

## 2023-12-25 NOTE — Addendum Note (Signed)
 Addended by: Winford Hehn N on: 12/25/2023 01:07 PM   Modules accepted: Orders

## 2023-12-26 ENCOUNTER — Other Ambulatory Visit (HOSPITAL_COMMUNITY): Payer: Self-pay

## 2023-12-26 NOTE — Telephone Encounter (Signed)
 ERROR

## 2024-01-03 DIAGNOSIS — C61 Malignant neoplasm of prostate: Secondary | ICD-10-CM | POA: Diagnosis not present

## 2024-01-10 DIAGNOSIS — N5201 Erectile dysfunction due to arterial insufficiency: Secondary | ICD-10-CM | POA: Diagnosis not present

## 2024-01-10 DIAGNOSIS — C61 Malignant neoplasm of prostate: Secondary | ICD-10-CM | POA: Diagnosis not present

## 2024-01-10 DIAGNOSIS — R3915 Urgency of urination: Secondary | ICD-10-CM | POA: Diagnosis not present

## 2024-01-10 DIAGNOSIS — N200 Calculus of kidney: Secondary | ICD-10-CM | POA: Diagnosis not present

## 2024-01-11 ENCOUNTER — Other Ambulatory Visit: Payer: Self-pay

## 2024-01-11 DIAGNOSIS — K859 Acute pancreatitis without necrosis or infection, unspecified: Secondary | ICD-10-CM | POA: Insufficient documentation

## 2024-01-11 DIAGNOSIS — J189 Pneumonia, unspecified organism: Secondary | ICD-10-CM | POA: Insufficient documentation

## 2024-01-11 DIAGNOSIS — I10 Essential (primary) hypertension: Secondary | ICD-10-CM | POA: Insufficient documentation

## 2024-01-11 DIAGNOSIS — Z8719 Personal history of other diseases of the digestive system: Secondary | ICD-10-CM | POA: Insufficient documentation

## 2024-01-11 DIAGNOSIS — K2270 Barrett's esophagus without dysplasia: Secondary | ICD-10-CM | POA: Insufficient documentation

## 2024-01-11 DIAGNOSIS — I2510 Atherosclerotic heart disease of native coronary artery without angina pectoris: Secondary | ICD-10-CM | POA: Insufficient documentation

## 2024-01-11 DIAGNOSIS — G709 Myoneural disorder, unspecified: Secondary | ICD-10-CM | POA: Insufficient documentation

## 2024-01-11 DIAGNOSIS — C61 Malignant neoplasm of prostate: Secondary | ICD-10-CM | POA: Insufficient documentation

## 2024-01-12 ENCOUNTER — Ambulatory Visit: Attending: Cardiology | Admitting: Cardiology

## 2024-01-12 ENCOUNTER — Encounter: Payer: Self-pay | Admitting: Cardiology

## 2024-01-12 VITALS — BP 120/60 | HR 52 | Ht 74.0 in | Wt 210.0 lb

## 2024-01-12 DIAGNOSIS — I2510 Atherosclerotic heart disease of native coronary artery without angina pectoris: Secondary | ICD-10-CM | POA: Diagnosis not present

## 2024-01-12 DIAGNOSIS — I10 Essential (primary) hypertension: Secondary | ICD-10-CM | POA: Diagnosis not present

## 2024-01-12 MED ORDER — DILTIAZEM HCL ER COATED BEADS 240 MG PO CP24: 240.0000 mg | ORAL_CAPSULE | Freq: Every day | ORAL | 3 refills | Status: DC

## 2024-01-12 NOTE — Progress Notes (Unsigned)
 Cardiology Office Note:    Date:  01/12/2024   ID:  Dean Guzman, DOB April 26, 1951, MRN [id number]  PCP:  Frann Mabel Mt, DO  Cardiologist:  Lamar Fitch, MD    Referring MD: Frann Mabel Mt*   No chief complaint on file.   History of Present Illness:    Korbin Mapps is a 73 y.o. male past medical history significant for coronary artery disease, in 2024 he did have cardiac catheterization required stent to mid LAD, also dyslipidemia, essential hypertension, factor V Leiden mutation, last year he ended up having pancreatitis while on a cruise ship required being airlifted from [employer].  After that they 12.  Now it looks like the biggest problem he has is gastroparesis.  Denies have any cardiac complaint but does have some episode that he gets dizzy momentarily.  Last time I did cut down his medication which helped somewhat.  But still this episode happened maybe every 2 weeks or so.  Past Medical History:  Diagnosis Date   Anemia 02/03/2023   Atypical chest pain 08/31/2021   Barrett's esophagus    Barrett's esophagus without dysplasia 07/16/2021   BPH (benign prostatic hyperplasia)    CAD (coronary artery disease) PTCA and stenting of the mid LAD in April 2023 09/08/2021   Change in bowel habit 08/31/2021   Colon polyps    Coronary artery disease    stent 4 2023   Cramping of feet 04/20/2018   Decreased anal sphincter tone 11/07/2022   Dyspnea on exertion 12/21/2021   Enteritis 08/01/2018   Essential hypertension 04/20/2018   Gastric ulcer 07/20/2023   Gastric ulcer without hemorrhage or perforation 05/15/2023   Gastritis and gastroduodenitis 05/15/2023   H/O cluster headache 07/13/2011   History of brachytherapy 11/07/2022   History of hiatal hernia    History of kidney stones    History of pancreatitis 05/15/2023   Hypercholesterolemia 10/05/2010   Hyperlipidemia    Hypertension    Idiopathic acute pancreatitis without infection or necrosis  07/20/2023   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 05/15/2023   Ileitis 08/01/2018   Ileus (HCC) 02/03/2023   Imaging of gastrointestinal tract abnormal 08/31/2021   Internal hemorrhoids 08/31/2021   Irritable bowel syndrome 08/31/2021   Metabolic syndrome 11/07/2011   Mucus in stool 08/31/2021   Near syncope 07/21/2021   Neuromuscular disorder (HCC)    neuropathy feet   Numbness of toes 04/20/2018   Other general symptoms and signs 08/31/2021   Pancreatitis    Paroxysmal ventricular tachycardia (HCC) 09/08/2021   Perennial allergic rhinitis 12/25/2018   Peripheral neuropathy, idiopathic 10/05/2010   Personal history of prostate cancer 11/07/2022   Pneumonia    Prolapsed internal hemorrhoids, grade 4 11/07/2022   Prostate cancer (HCC)    Radiation proctitis 08/31/2021   Right lower quadrant pain 08/31/2021   Sinus bradycardia 11/07/2011   SECONDARY TO MEDICATIONS     Vitamin D  deficiency 10/05/2010    Past Surgical History:  Procedure Laterality Date   BIOPSY  05/15/2023   Procedure: BIOPSY;  Surgeon: Wilhelmenia Aloha Raddle., MD;  Location: [facility];  Service: Gastroenterology;;   colonscopy     CORONARY ANGIOGRAPHY N/A 09/08/2021   Procedure: CORONARY ANGIOGRAPHY;  Surgeon: Wonda Sharper, MD;  Location: [facility] INVASIVE CV LAB;  Service: Cardiovascular;  Laterality: N/A;   CORONARY ANGIOPLASTY  08/2021   CORONARY PRESSURE/FFR STUDY N/A 09/08/2021   Procedure: INTRAVASCULAR PRESSURE WIRE/FFR STUDY;  Surgeon: Wonda Sharper, MD;  Location: [facility] INVASIVE CV LAB;  Service: Cardiovascular;  Laterality: N/A;   CORONARY STENT INTERVENTION N/A 09/08/2021   Procedure: CORONARY STENT INTERVENTION;  Surgeon: Wonda Sharper, MD;  Location: [facility] INVASIVE CV LAB;  Service: Cardiovascular;  Laterality: N/A;   CYSTOSCOPY N/A 01/13/2020   Procedure: CYSTOSCOPY FLEXIBLE;  Surgeon: Carolee Sherwood JONETTA DOUGLAS, MD;  Location: [facility];  Service: Urology;  Laterality: N/A;  NO SEEDS FOUND IN  BLADDER   CYSTOSCOPY/URETEROSCOPY/HOLMIUM LASER/STENT PLACEMENT Left 06/15/2022   Procedure: CYSTOSCOPY LEFT URETEROSCOPY/HOLMIUM LASER/STENT PLACEMENT;  Surgeon: Carolee Sherwood JONETTA DOUGLAS, MD;  Location: [facility];  Service: Urology;  Laterality: Left;  1 HR FOR CASE   ESOPHAGEAL MANOMETRY N/A 04/28/2022   Procedure: ESOPHAGEAL MANOMETRY (EM);  Surgeon: Dianna Specking, MD;  Location: [facility];  Service: Gastroenterology;  Laterality: N/A;   ESOPHAGOGASTRODUODENOSCOPY (EGD) WITH PROPOFOL  N/A 05/15/2023   Procedure: ESOPHAGOGASTRODUODENOSCOPY (EGD) WITH PROPOFOL ;  Surgeon: Wilhelmenia Aloha Raddle., MD;  Location: [facility];  Service: Gastroenterology;  Laterality: N/A;   ESOPHAGOGASTRODUODENOSCOPY (EGD) WITH PROPOFOL  N/A 07/20/2023   Procedure: ESOPHAGOGASTRODUODENOSCOPY (EGD) WITH PROPOFOL ;  Surgeon: Wilhelmenia Aloha Raddle., MD;  Location: [facility];  Service: Gastroenterology;  Laterality: N/A;   EUS N/A 05/15/2023   Procedure: UPPER ENDOSCOPIC ULTRASOUND (EUS) RADIAL;  Surgeon: Wilhelmenia Aloha Raddle., MD;  Location: [facility];  Service: Gastroenterology;  Laterality: N/A;   EUS N/A 07/20/2023   Procedure: UPPER ENDOSCOPIC ULTRASOUND (EUS) RADIAL;  Surgeon: Wilhelmenia Aloha Raddle., MD;  Location: [facility];  Service: Gastroenterology;  Laterality: N/A;   EXTRACORPOREAL SHOCK WAVE LITHOTRIPSY Left 06/10/2022   Procedure: LEFT EXTRACORPOREAL SHOCK WAVE LITHOTRIPSY (ESWL);  Surgeon: Rosalind Zachary NOVAK, MD;  Location: [facility];  Service: Urology;  Laterality: Left;   EYE SURGERY     Lasik   FINE NEEDLE ASPIRATION N/A 07/20/2023   Procedure: FINE NEEDLE ASPIRATION (FNA) LINEAR;  Surgeon: Wilhelmenia Aloha Raddle., MD;  Location: [facility];  Service: Gastroenterology;  Laterality: N/A;   MASS EXCISION Left 02/08/2021   Procedure: EXCISION MASS OF MUCOID CYST WITH DISTAL INTERPHANGEAL JOINT ARTHROTOMY LEFT MIDDLE FINGER;  Surgeon: Murrell Drivers, MD;  Location: [facility];  Service: Orthopedics;  Laterality: Left;   NASAL ENDOSCOPY     PROSTATE BIOPSY  may 2021 and 2020   RADIOACTIVE SEED IMPLANT N/A 01/13/2020   Procedure: RADIOACTIVE SEED IMPLANT/BRACHYTHERAPY IMPLANT;  Surgeon: Carolee Sherwood JONETTA DOUGLAS, MD;  Location: [facility] ;  Service: Urology;  Laterality: N/A;    73 SEEDS IMPLANTED   SPACE OAR INSTILLATION N/A 01/13/2020   Procedure: SPACE OAR INSTILLATION;  Surgeon: Carolee Sherwood JONETTA DOUGLAS, MD;  Location: [facility];  Service: Urology;  Laterality: N/A;   XI ROBOTIC ASSISTED HIATAL HERNIA REPAIR N/A 05/19/2022   Procedure: XI ROBOTIC ASSISTED REPAIR OF PARAESOPHAGEAL HIATAL HERNIA WITH FUNDOPLICATION;  Surgeon: Sheldon Standing, MD;  Location: [facility];  Service: General;  Laterality: N/A;    Current Medications: Current Meds  Medication Sig   AMBULATORY NON FORMULARY MEDICATION Take 10 mg (1 tablet) of domperidone by mouth three times daily.   aspirin  EC 81 MG tablet Take 81 mg by mouth daily. Swallow whole.   cholecalciferol  (VITAMIN D3) 25 MCG (1000 UT) tablet Take 1,000 Units by mouth daily.   diltiazem  (CARDIZEM  CD) 360 MG 24 hr capsule Take 1 capsule (360 mg total) by mouth daily.   ezetimibe  (ZETIA ) 10 MG tablet TAKE 1 TABLET BY MOUTH EVERY DAY   ipratropium (ATROVENT ) 0.06 % nasal spray SPRAY 2 SPRAYS INTO EACH NOSTRIL 3 TIMES A  DAY AS NEEDED FOR RHINITIS   lisinopril  (ZESTRIL ) 5 MG tablet Take 1 tablet (5 mg total) by mouth daily.   ondansetron  (ZOFRAN -ODT) 4 MG disintegrating tablet Take 1 tablet (4 mg total) by mouth every 8 (eight) hours as needed for nausea or vomiting.   pantoprazole  (PROTONIX ) 40 MG tablet Take 1 tablet (40 mg total) by mouth daily.   rosuvastatin  (CRESTOR ) 20 MG tablet Take 1 tablet (20 mg total) by mouth daily.   Vibegron (GEMTESA) 75 MG TABS Take 75 mg by mouth daily.     Allergies:   Patient has no known allergies.   Social History   Socioeconomic History   Marital status:  Married    Spouse name: Not on file   Number of children: 3   Years of education: Not on file   Highest education level: 12th grade  Occupational History    Comment: full time  Tobacco Use   Smoking status: Former    Current packs/day: 0.00    Average packs/day: 1 pack/day for 12.0 years (12.0 ttl pk-yrs)    Types: Cigarettes    Start date: 05/30/1966    Quit date: 05/30/1978    Years since quitting: 45.6   Smokeless tobacco: Never  Vaping Use   Vaping status: Never Used  Substance and Sexual Activity   Alcohol use: Yes    Comment: Light, occasional one ot two per month   Drug use: Not Currently   Sexual activity: Yes    Birth control/protection: None  Other Topics Concern   Not on file  Social History Narrative   Not on file   Social Drivers of Health   Financial Resource Strain: Low Risk  (12/12/2023)   Overall Financial Resource Strain (CARDIA)    Difficulty of Paying Living Expenses: Not hard at all  Food Insecurity: No Food Insecurity (12/12/2023)   Hunger Vital Sign    Worried About Running Out of Food in the Last Year: Never true    Ran Out of Food in the Last Year: Never true  Transportation Needs: No Transportation Needs (12/12/2023)   PRAPARE - Administrator, Civil Service (Medical): No    Lack of Transportation (Non-Medical): No  Physical Activity: Sufficiently Active (12/12/2023)   Exercise Vital Sign    Days of Exercise per Week: 7 days    Minutes of Exercise per Session: 30 min  Stress: No Stress Concern Present (12/12/2023)   [employer] of Occupational Health - Occupational Stress Questionnaire    Feeling of Stress: Not at all  Social Connections: Socially Isolated (12/12/2023)   Social Connection and Isolation Panel    Frequency of Communication with Friends and Family: Once a week    Frequency of Social Gatherings with Friends and Family: Once a week    Attends Religious Services: Never    Database administrator or Organizations: No     Attends Engineer, structural: Not on file    Marital Status: Married     Family History: The patient's family history includes Arthritis in Shameek's mother; Breast cancer in Kenard's sister; Cancer in Dmario's sister; Diabetes in Antawn's maternal grandmother; Early death in Amaad's paternal grandfather and paternal grandmother; Hearing loss in Mordche's paternal grandfather and paternal grandmother; Heart attack in Zohan's paternal uncle; Heart disease in Clayvon's father; Hyperlipidemia in Shulem's father; Hypertension in Bevin's father. There is no history of Allergic rhinitis, Asthma, Eczema, Urticaria, Prostate cancer, Colon cancer, Pancreatic cancer, or Esophageal cancer. ROS:  Please see the history of present illness.    All 14 point review of systems negative except as described per history of present illness  EKGs/Labs/Other Studies Reviewed:         Recent Labs: 08/22/2023: ALT 23; BUN 17; Creatinine, Ser 1.05; Hemoglobin 14.7; Platelets 151.0; Potassium 4.9; Sodium 139; TSH 1.07  Recent Lipid Panel    Component Value Date/Time   CHOL 112 08/22/2023 1109   CHOL 112 08/01/2022 1057   TRIG 75.0 08/22/2023 1109   HDL 49.80 08/22/2023 1109   HDL 45 08/01/2022 1057   CHOLHDL 2 08/22/2023 1109   VLDL 15.0 08/22/2023 1109   LDLCALC 47 08/22/2023 1109   LDLCALC 58 02/10/2023 1352    Physical Exam:    VS:  BP 120/60   Pulse (!) 52   Ht 6' 2 (1.88 m)   Wt 210 lb 0.6 oz (95.3 kg)   SpO2 97%   BMI 26.97 kg/m     Wt Readings from Last 3 Encounters:  01/12/24 210 lb 0.6 oz (95.3 kg)  12/19/23 213 lb (96.6 kg)  11/01/23 213 lb 6 oz (96.8 kg)     GEN:  Well nourished, well developed in no acute distress HEENT: Normal NECK: No JVD; No carotid bruits LYMPHATICS: No lymphadenopathy CARDIAC: RRR, no murmurs, no rubs, no gallops RESPIRATORY:  Clear to auscultation without rales, wheezing or rhonchi  ABDOMEN: Soft, non-tender, non-distended MUSCULOSKELETAL:  No edema; No  deformity  SKIN: Warm and dry LOWER EXTREMITIES: no swelling NEUROLOGIC:  Alert and oriented x 3 PSYCHIATRIC:  Normal affect   ASSESSMENT:    1. Coronary artery disease involving native coronary artery of native heart without angina pectoris   2. Essential hypertension    PLAN:    In order of problems listed above:  Coronary disease stable on appropriate medication which I will continue. Essential hypertension blood pressure well-controlled.  I will cut down his Cardizem  from 3 62-40. Dyslipidemia he is taking Crestor  20 I did review K PN which show me LDL 47 HDL 49 we will continue present management. Episodes of weakness and fatigue.  I did cut down medications before which helps TO BE MORE SEE IF IT HAVE ANY IMPROVEMENT   Medication Adjustments/Labs and Tests Ordered: Current medicines are reviewed at length with the patient today.  Concerns regarding medicines are outlined above.  No orders of the defined types were placed in this encounter.  Medication changes: No orders of the defined types were placed in this encounter.   Signed, Lamar DOROTHA Fitch, MD, [facility] 01/12/2024 10:43 AM    [facility]

## 2024-01-12 NOTE — Patient Instructions (Addendum)
 Medication Instructions:   DECREASE: Cardizem  CD to 240mg  daily   Lab Work: None Ordered If you have labs (blood work) drawn today and your tests are completely normal, you will receive your results only by: MyChart Message (if you have MyChart) OR A paper copy in the mail If you have any lab test that is abnormal or we need to change your treatment, we will call you to review the results.   Testing/Procedures: None Ordered   Follow-Up: At [facility], you and your health needs are our priority.  As part of our continuing mission to provide you with exceptional heart care, we have created designated Provider Care Teams.  These Care Teams include your primary Cardiologist (physician) and Advanced Practice Providers (APPs -  Physician Assistants and Nurse Practitioners) who all work together to provide you with the care you need, when you need it.  We recommend signing up for the patient portal called MyChart.  Sign up information is provided on this After Visit Summary.  MyChart is used to connect with patients for Virtual Visits (Telemedicine).  Patients are able to view lab/test results, encounter notes, upcoming appointments, etc.  Non-urgent messages can be sent to your provider as well.   To learn more about what you can do with MyChart, go to [email or web address].    Your next appointment:   6 month(s)  The format for your next appointment:   In Person  Provider:   Lamar Fitch, MD    Other Instructions NA

## 2024-01-15 ENCOUNTER — Encounter: Payer: Self-pay | Admitting: Cardiology

## 2024-01-18 ENCOUNTER — Other Ambulatory Visit: Payer: Self-pay

## 2024-01-18 ENCOUNTER — Telehealth: Payer: Self-pay | Admitting: Cardiology

## 2024-01-18 ENCOUNTER — Telehealth: Payer: Self-pay

## 2024-01-18 DIAGNOSIS — I10 Essential (primary) hypertension: Secondary | ICD-10-CM

## 2024-01-18 MED ORDER — HYDROCHLOROTHIAZIDE 12.5 MG PO CAPS: 12.5000 mg | ORAL_CAPSULE | Freq: Every day | ORAL | 3 refills | Status: DC

## 2024-01-18 NOTE — Telephone Encounter (Signed)
 Dr. Bernie said we could try you on hydrochlorothiazide  12.5mg  1 tablet daily. Then he would like to check blood work in 1 week to be sure your potassium and kidney function is ok on this medication.

## 2024-01-18 NOTE — Telephone Encounter (Signed)
 Pt c/o medication issue:  1. Name of Medication:   diltiazem  (CARDIZEM  CD) 240 MG 24 hr capsule    2. How are you currently taking this medication (dosage and times per day)?  Take 1 capsule (240 mg total) by mouth daily.      3. Are you having a reaction (difficulty breathing--STAT)? No  4. What is your medication issue? Pt is requesting a callback regarding his MyChart messages. He stated he still hasn't heard back. Please advise

## 2024-01-23 MED ORDER — ERYTHROMYCIN BASE 250 MG PO TABS: 250.0000 mg | ORAL_TABLET | Freq: Three times a day (TID) | ORAL | 1 refills | Status: DC

## 2024-01-23 NOTE — Addendum Note (Signed)
 Addended by: Kyshaun Barnette N on: 01/23/2024 09:09 AM   Modules accepted: Orders

## 2024-01-23 NOTE — Addendum Note (Signed)
 Addended by: Jonaven Hilgers N on: 01/23/2024 09:16 AM   Modules accepted: Orders

## 2024-01-24 ENCOUNTER — Telehealth: Payer: Self-pay

## 2024-01-24 NOTE — Telephone Encounter (Signed)
 See Previous note. It was decided that pt would start hydrochlorothiazide  12.5mg  daily. Pt agreed and had no further questions.

## 2024-01-31 DIAGNOSIS — I10 Essential (primary) hypertension: Secondary | ICD-10-CM | POA: Diagnosis not present

## 2024-02-01 LAB — BASIC METABOLIC PANEL WITH GFR
BUN/Creatinine Ratio: 17 (ref 10–24)
BUN: 17 mg/dL (ref 8–27)
CO2: 24 mmol/L (ref 20–29)
Calcium: 10.1 mg/dL (ref 8.6–10.2)
Chloride: 95 mmol/L — ABNORMAL LOW (ref 96–106)
Creatinine, Ser: 1.03 mg/dL (ref 0.76–1.27)
Glucose: 111 mg/dL — ABNORMAL HIGH (ref 70–99)
Potassium: 5.4 mmol/L — ABNORMAL HIGH (ref 3.5–5.2)
Sodium: 136 mmol/L (ref 134–144)
eGFR: 77 mL/min/1.73 (ref >59)

## 2024-02-02 ENCOUNTER — Ambulatory Visit: Payer: Self-pay | Admitting: Cardiology

## 2024-02-02 DIAGNOSIS — E875 Hyperkalemia: Secondary | ICD-10-CM

## 2024-02-03 ENCOUNTER — Other Ambulatory Visit: Payer: Self-pay | Admitting: Family Medicine

## 2024-02-13 MED ORDER — HYDROCHLOROTHIAZIDE 25 MG PO TABS: 25.0000 mg | ORAL_TABLET | Freq: Every day | ORAL | 3 refills | Status: DC

## 2024-02-14 ENCOUNTER — Telehealth: Payer: Self-pay

## 2024-02-14 ENCOUNTER — Other Ambulatory Visit: Payer: Self-pay

## 2024-02-14 DIAGNOSIS — K3184 Gastroparesis: Secondary | ICD-10-CM

## 2024-02-14 NOTE — Telephone Encounter (Signed)
 EGD with pylorus botox injection scheduled at [facility] on Tuesday, 03/05/24 at 9:15 am (Dr.McGreal).  Instructions sent to patient via MyChart letter.  Ambulatory referral to GI in epic.

## 2024-02-19 DIAGNOSIS — E875 Hyperkalemia: Secondary | ICD-10-CM | POA: Diagnosis not present

## 2024-02-20 LAB — BASIC METABOLIC PANEL WITH GFR
BUN/Creatinine Ratio: 16 (ref 10–24)
BUN: 16 mg/dL (ref 8–27)
CO2: 24 mmol/L (ref 20–29)
Calcium: 9.8 mg/dL (ref 8.6–10.2)
Chloride: 100 mmol/L (ref 96–106)
Creatinine, Ser: 1.02 mg/dL (ref 0.76–1.27)
Glucose: 102 mg/dL — ABNORMAL HIGH (ref 70–99)
Potassium: 5 mmol/L (ref 3.5–5.2)
Sodium: 138 mmol/L (ref 134–144)
eGFR: 78 mL/min/1.73 (ref >59)

## 2024-02-23 ENCOUNTER — Telehealth: Payer: Self-pay

## 2024-02-23 MED ORDER — HYDROCHLOROTHIAZIDE 12.5 MG PO CAPS: 12.5000 mg | ORAL_CAPSULE | Freq: Every day | ORAL | 3 refills | Status: AC

## 2024-02-23 NOTE — Telephone Encounter (Signed)
 Hydrochlorothiazide  12.5mg  daily #80 sent to pharmacy

## 2024-02-23 NOTE — Telephone Encounter (Signed)
 Left message on My Chart with lab results per Dr. Karry note. Routed to PCP

## 2024-02-26 ENCOUNTER — Telehealth: Payer: Self-pay

## 2024-02-26 NOTE — Telephone Encounter (Signed)
 Pt viewed lab results on My Chart per Dr. Karry note. Routed to PCP.

## 2024-02-27 ENCOUNTER — Encounter (HOSPITAL_COMMUNITY): Payer: Self-pay | Admitting: Pediatrics

## 2024-02-27 ENCOUNTER — Other Ambulatory Visit: Payer: Self-pay | Admitting: Family Medicine

## 2024-02-27 NOTE — Telephone Encounter (Unsigned)
 Copied from CRM [id number]. Topic: Appointments - Appointment Scheduling >> Feb 27, 2024 11:54 AM Alfonso HERO wrote: Patient needing a refill of ipratropium (ATROVENT ) 0.06 % nasal spray. It was denied because he needed and appt. One has now been made.

## 2024-03-01 ENCOUNTER — Telehealth: Payer: Self-pay

## 2024-03-01 NOTE — Telephone Encounter (Signed)
 Procedure:Botox injection-N/A EGD Procedure date: 03/05/24 Procedure location: [facility] Arrival Time: 7:45 Spoke with the patient Y/N: Y Any prep concerns? N  Has the patient obtained the prep from the pharmacy ? N Do you have a care partner and transportation: Y Any additional concerns? N

## 2024-03-05 ENCOUNTER — Encounter (HOSPITAL_COMMUNITY): Payer: Self-pay | Admitting: Pediatrics

## 2024-03-05 ENCOUNTER — Ambulatory Visit (HOSPITAL_COMMUNITY): Admitting: Anesthesiology

## 2024-03-05 ENCOUNTER — Other Ambulatory Visit: Payer: Self-pay

## 2024-03-05 ENCOUNTER — Encounter (HOSPITAL_COMMUNITY): Admission: RE | Disposition: A | Payer: Self-pay | Source: Home / Self Care | Attending: Pediatrics

## 2024-03-05 ENCOUNTER — Ambulatory Visit (HOSPITAL_COMMUNITY)
Admission: RE | Admit: 2024-03-05 | Discharge: 2024-03-05 | Disposition: A | Discharge status: Home / Self Care | Attending: Pediatrics | Admitting: Pediatrics

## 2024-03-05 DIAGNOSIS — K2270 Barrett's esophagus without dysplasia: Secondary | ICD-10-CM | POA: Diagnosis not present

## 2024-03-05 DIAGNOSIS — I2510 Atherosclerotic heart disease of native coronary artery without angina pectoris: Secondary | ICD-10-CM

## 2024-03-05 DIAGNOSIS — Z9889 Other specified postprocedural states: Secondary | ICD-10-CM

## 2024-03-05 DIAGNOSIS — R6881 Early satiety: Secondary | ICD-10-CM | POA: Diagnosis not present

## 2024-03-05 DIAGNOSIS — Z87891 Personal history of nicotine dependence: Secondary | ICD-10-CM | POA: Insufficient documentation

## 2024-03-05 DIAGNOSIS — K449 Diaphragmatic hernia without obstruction or gangrene: Secondary | ICD-10-CM | POA: Insufficient documentation

## 2024-03-05 DIAGNOSIS — I10 Essential (primary) hypertension: Secondary | ICD-10-CM | POA: Diagnosis not present

## 2024-03-05 DIAGNOSIS — N400 Enlarged prostate without lower urinary tract symptoms: Secondary | ICD-10-CM | POA: Insufficient documentation

## 2024-03-05 DIAGNOSIS — T182XXA Foreign body in stomach, initial encounter: Secondary | ICD-10-CM

## 2024-03-05 DIAGNOSIS — E7800 Pure hypercholesterolemia, unspecified: Secondary | ICD-10-CM | POA: Diagnosis not present

## 2024-03-05 DIAGNOSIS — K3184 Gastroparesis: Secondary | ICD-10-CM | POA: Insufficient documentation

## 2024-03-05 DIAGNOSIS — K2289 Other specified disease of esophagus: Secondary | ICD-10-CM | POA: Diagnosis not present

## 2024-03-05 DIAGNOSIS — Z8711 Personal history of peptic ulcer disease: Secondary | ICD-10-CM | POA: Diagnosis not present

## 2024-03-05 HISTORY — PX: ESOPHAGOGASTRODUODENOSCOPY: SHX5428

## 2024-03-05 HISTORY — PX: BOTOX INJECTION: SHX5754

## 2024-03-05 SURGERY — EGD (ESOPHAGOGASTRODUODENOSCOPY)
Anesthesia: Monitor Anesthesia Care

## 2024-03-05 MED ORDER — SODIUM CHLORIDE (PF) 0.9 % IJ SOLN
INTRAMUSCULAR | Status: DC | PRN
  Administered 2024-03-05: 4 mL via SUBMUCOSAL

## 2024-03-05 MED ORDER — PROPOFOL 500 MG/50ML IV EMUL
INTRAVENOUS | Status: DC | PRN
  Administered 2024-03-05: 100 ug/kg/min via INTRAVENOUS
  Administered 2024-03-05 (×2): 50 mg via INTRAVENOUS

## 2024-03-05 MED ORDER — SODIUM CHLORIDE 0.9 % IV SOLN
INTRAVENOUS | Status: AC | PRN
  Administered 2024-03-05: 500 mL via INTRAMUSCULAR

## 2024-03-05 MED ORDER — PROPOFOL 500 MG/50ML IV EMUL
INTRAVENOUS | Status: AC
  Filled 2024-03-05: qty 50

## 2024-03-05 MED ORDER — SODIUM CHLORIDE (PF) 0.9 % IJ SOLN
INTRAMUSCULAR | Status: AC
  Filled 2024-03-05: qty 10

## 2024-03-05 MED ORDER — PROPOFOL 10 MG/ML IV BOLUS
INTRAVENOUS | Status: AC
  Filled 2024-03-05: qty 20

## 2024-03-05 MED ORDER — PROPOFOL 1000 MG/100ML IV EMUL
INTRAVENOUS | Status: AC
  Filled 2024-03-05: qty 100

## 2024-03-05 MED ORDER — SODIUM CHLORIDE 0.9 % IV SOLN: INTRAVENOUS | Status: DC

## 2024-03-05 MED ORDER — ONABOTULINUMTOXINA 100 UNITS IJ SOLR
INTRAMUSCULAR | Status: AC
  Filled 2024-03-05: qty 100

## 2024-03-05 NOTE — Discharge Instructions (Signed)

## 2024-03-05 NOTE — H&P (Signed)
 [facility] History and Physical   Primary Care Physician:  Frann Mabel Mt, DO   Reason for Procedure:  Abdominal pain, nausea, gastroparesis, Barrett's esophagus, hiatal hernia, history of gastric ulcer  Plan:    Upper endoscopy with pyloric Botox injection     HPI: Dean Guzman is a 73 y.o. male undergoing upper endoscopy with pyloric Botox injection for symptoms of abdominal pain, nausea, gastroparesis, Barrett's esophagus and history of gastric ulcer.  Patient is followed by Dr. Federico.  He has documented gastroparesis based on gastric emptying scan.  Has not had benefit from Reglan , Motegrity , domperidone or erythromycin .  Endorses symptoms of chronic nausea and fullness.  No vomiting.  Has associated constipation.  Upper endoscopy is performed today for therapeutic purposes with pyloric Botox injection.   Past Medical History:  Diagnosis Date   Anemia 02/03/2023   Atypical chest pain 08/31/2021   Barrett's esophagus    Barrett's esophagus without dysplasia 07/16/2021   BPH (benign prostatic hyperplasia)    CAD (coronary artery disease) PTCA and stenting of the mid LAD in April 2023 09/08/2021   Change in bowel habit 08/31/2021   Colon polyps    Coronary artery disease    stent 4 2023   Cramping of feet 04/20/2018   Decreased anal sphincter tone 11/07/2022   Dyspnea on exertion 12/21/2021   Enteritis 08/01/2018   Essential hypertension 04/20/2018   Gastric ulcer 07/20/2023   Gastric ulcer without hemorrhage or perforation 05/15/2023   Gastritis and gastroduodenitis 05/15/2023   H/O cluster headache 07/13/2011   History of brachytherapy 11/07/2022   History of hiatal hernia    History of kidney stones    History of pancreatitis 05/15/2023   Hypercholesterolemia 10/05/2010   Hyperlipidemia    Hypertension    Idiopathic acute pancreatitis without infection or necrosis 07/20/2023   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 05/15/2023   Ileitis 08/01/2018    Ileus (HCC) 02/03/2023   Imaging of gastrointestinal tract abnormal 08/31/2021   Internal hemorrhoids 08/31/2021   Irritable bowel syndrome 08/31/2021   Metabolic syndrome 11/07/2011   Mucus in stool 08/31/2021   Near syncope 07/21/2021   Neuromuscular disorder (HCC)    neuropathy feet   Numbness of toes 04/20/2018   Other general symptoms and signs 08/31/2021   Pancreatitis    Paroxysmal ventricular tachycardia (HCC) 09/08/2021   Perennial allergic rhinitis 12/25/2018   Peripheral neuropathy, idiopathic 10/05/2010   Personal history of prostate cancer 11/07/2022   Pneumonia    Prolapsed internal hemorrhoids, grade 4 11/07/2022   Prostate cancer (HCC)    Radiation proctitis 08/31/2021   Right lower quadrant pain 08/31/2021   Sinus bradycardia 11/07/2011   SECONDARY TO MEDICATIONS     Vitamin D  deficiency 10/05/2010    Past Surgical History:  Procedure Laterality Date   BIOPSY  05/15/2023   Procedure: BIOPSY;  Surgeon: Wilhelmenia Aloha Raddle., MD;  Location: [facility];  Service: Gastroenterology;;   colonscopy     CORONARY ANGIOGRAPHY N/A 09/08/2021   Procedure: CORONARY ANGIOGRAPHY;  Surgeon: Wonda Sharper, MD;  Location: [facility] INVASIVE CV LAB;  Service: Cardiovascular;  Laterality: N/A;   CORONARY ANGIOPLASTY  08/2021   CORONARY PRESSURE/FFR STUDY N/A 09/08/2021   Procedure: INTRAVASCULAR PRESSURE WIRE/FFR STUDY;  Surgeon: Wonda Sharper, MD;  Location: [facility] INVASIVE CV LAB;  Service: Cardiovascular;  Laterality: N/A;   CORONARY STENT INTERVENTION N/A 09/08/2021   Procedure: CORONARY STENT INTERVENTION;  Surgeon: Wonda Sharper, MD;  Location: [facility] INVASIVE CV LAB;  Service: Cardiovascular;  Laterality: N/A;  CYSTOSCOPY N/A 01/13/2020   Procedure: CYSTOSCOPY FLEXIBLE;  Surgeon: Carolee Sherwood JONETTA DOUGLAS, MD;  Location: [facility];  Service: Urology;  Laterality: N/A;  NO SEEDS FOUND IN BLADDER   CYSTOSCOPY/URETEROSCOPY/HOLMIUM LASER/STENT PLACEMENT Left  06/15/2022   Procedure: CYSTOSCOPY LEFT URETEROSCOPY/HOLMIUM LASER/STENT PLACEMENT;  Surgeon: Carolee Sherwood JONETTA DOUGLAS, MD;  Location: [facility];  Service: Urology;  Laterality: Left;  1 HR FOR CASE   ESOPHAGEAL MANOMETRY N/A 04/28/2022   Procedure: ESOPHAGEAL MANOMETRY (EM);  Surgeon: Dianna Specking, MD;  Location: [facility];  Service: Gastroenterology;  Laterality: N/A;   ESOPHAGOGASTRODUODENOSCOPY (EGD) WITH PROPOFOL  N/A 05/15/2023   Procedure: ESOPHAGOGASTRODUODENOSCOPY (EGD) WITH PROPOFOL ;  Surgeon: Wilhelmenia Aloha Raddle., MD;  Location: [facility];  Service: Gastroenterology;  Laterality: N/A;   ESOPHAGOGASTRODUODENOSCOPY (EGD) WITH PROPOFOL  N/A 07/20/2023   Procedure: ESOPHAGOGASTRODUODENOSCOPY (EGD) WITH PROPOFOL ;  Surgeon: Wilhelmenia Aloha Raddle., MD;  Location: [facility];  Service: Gastroenterology;  Laterality: N/A;   EUS N/A 05/15/2023   Procedure: UPPER ENDOSCOPIC ULTRASOUND (EUS) RADIAL;  Surgeon: Wilhelmenia Aloha Raddle., MD;  Location: [facility];  Service: Gastroenterology;  Laterality: N/A;   EUS N/A 07/20/2023   Procedure: UPPER ENDOSCOPIC ULTRASOUND (EUS) RADIAL;  Surgeon: Wilhelmenia Aloha Raddle., MD;  Location: [facility];  Service: Gastroenterology;  Laterality: N/A;   EXTRACORPOREAL SHOCK WAVE LITHOTRIPSY Left 06/10/2022   Procedure: LEFT EXTRACORPOREAL SHOCK WAVE LITHOTRIPSY (ESWL);  Surgeon: Rosalind Zachary NOVAK, MD;  [facility];  Service: Urology;  Laterality: Left;   EYE SURGERY     Lasik   FINE NEEDLE ASPIRATION N/A 07/20/2023   Procedure: FINE NEEDLE ASPIRATION (FNA) LINEAR;  Surgeon: Wilhelmenia Aloha Raddle., MD;  Location: [facility];  Service: Gastroenterology;  Laterality: N/A;   MASS EXCISION Left 02/08/2021   Procedure: EXCISION MASS OF MUCOID CYST WITH DISTAL INTERPHANGEAL JOINT ARTHROTOMY LEFT MIDDLE FINGER;  Surgeon: Murrell Drivers, MD;  Location: [facility];  Service: Orthopedics;  Laterality: Left;   NASAL  ENDOSCOPY     PROSTATE BIOPSY  may 2021 and 2020   RADIOACTIVE SEED IMPLANT N/A 01/13/2020   Procedure: RADIOACTIVE SEED IMPLANT/BRACHYTHERAPY IMPLANT;  Surgeon: Carolee Sherwood JONETTA DOUGLAS, MD;  Location: [facility];  Service: Urology;  Laterality: N/A;    73 SEEDS IMPLANTED   SPACE OAR INSTILLATION N/A 01/13/2020   Procedure: SPACE OAR INSTILLATION;  Surgeon: Carolee Sherwood JONETTA DOUGLAS, MD;  Location: [facility];  Service: Urology;  Laterality: N/A;   XI ROBOTIC ASSISTED HIATAL HERNIA REPAIR N/A 05/19/2022   Procedure: XI ROBOTIC ASSISTED REPAIR OF PARAESOPHAGEAL HIATAL HERNIA WITH FUNDOPLICATION;  Surgeon: Sheldon Standing, MD;  Location: [facility];  Service: General;  Laterality: N/A;    Prior to Admission medications   Medication Sig Start Date End Date Taking? Authorizing Provider  aspirin  EC 81 MG tablet Take 81 mg by mouth daily. Swallow whole.   Yes [provider]  cholecalciferol  (VITAMIN D3) 25 MCG (1000 UT) tablet Take 1,000 Units by mouth daily.   Yes [provider]  diltiazem  (CARDIZEM  CD) 240 MG 24 hr capsule Take 1 capsule (240 mg total) by mouth daily. 01/12/24 04/11/24 Yes Krasowski, Robert J, MD  erythromycin  (E-MYCIN ) 250 MG tablet Take 1 tablet (250 mg total) by mouth 3 (three) times daily before meals. 01/23/24  Yes Federico Rosario BROCKS, MD  ezetimibe  (ZETIA ) 10 MG tablet TAKE 1 TABLET BY MOUTH EVERY DAY 07/04/23  Yes Krasowski, Robert J, MD  hydrochlorothiazide  (MICROZIDE ) 12.5 MG capsule Take 1 capsule (12.5 mg total)  by mouth daily. 02/23/24 05/23/24 Yes Krasowski, Robert J, MD  ipratropium (ATROVENT ) 0.06 % nasal spray SPRAY 2 SPRAYS INTO EACH NOSTRIL 3 TIMES A DAY AS NEEDED FOR RHINITIS 02/05/24  Yes Frann Mabel Mt, DO  lisinopril  (ZESTRIL ) 5 MG tablet Take 1 tablet (5 mg total) by mouth daily. 04/11/23 03/05/24 Yes Krasowski, Robert J, MD  ondansetron  (ZOFRAN -ODT) 4 MG disintegrating tablet Take 1 tablet (4 mg total) by mouth every 8 (eight)  hours as needed for nausea or vomiting. 12/25/23  Yes Federico Rosario BROCKS, MD  pantoprazole  (PROTONIX ) 40 MG tablet Take 1 tablet (40 mg total) by mouth daily. 11/01/23  Yes Federico Rosario BROCKS, MD  rosuvastatin  (CRESTOR ) 20 MG tablet Take 1 tablet (20 mg total) by mouth daily. 07/03/23  Yes Krasowski, Robert J, MD  Vibegron (GEMTESA) 75 MG TABS Take 75 mg by mouth daily.   Yes [provider]  AMBULATORY NON FORMULARY MEDICATION Take 10 mg (1 tablet) of domperidone by mouth three times daily. 12/04/23   Federico Rosario BROCKS, MD    Current Facility-Administered Medications  Medication Dose Route Frequency Provider Last Rate Last Admin   0.9 %  sodium chloride  infusion   Intravenous Continuous Kalese Ensz, Inocente HERO, MD       0.9 %  sodium chloride  infusion    Continuous PRN Suzann Inocente HERO, MD 10 mL/hr at 03/05/24 0817 500 mL at 03/05/24 0817    Allergies as of 02/14/2024   (No Known Allergies)    Family History  Problem Relation Age of Onset   Arthritis Mother    Heart disease Father    Hyperlipidemia Father    Hypertension Father    Breast cancer Sister    Cancer Sister    Diabetes Maternal Grandmother    Hearing loss Paternal Grandmother    Early death Paternal Grandmother    Hearing loss Paternal Grandfather    Early death Paternal Grandfather    Heart attack Paternal Uncle    Allergic rhinitis Neg Hx    Asthma Neg Hx    Eczema Neg Hx    Urticaria Neg Hx    Prostate cancer Neg Hx    Colon cancer Neg Hx    Pancreatic cancer Neg Hx    Esophageal cancer Neg Hx     Social History   Socioeconomic History   Marital status: Married    Spouse name: Not on file   Number of children: 3   Years of education: Not on file   Highest education level: 12th grade  Occupational History    Comment: full time  Tobacco Use   Smoking status: Former    Current packs/day: 0.00    Average packs/day: 1 pack/day for 12.0 years (12.0 ttl pk-yrs)    Types: Cigarettes    Start date: 05/30/1966    Quit  date: 05/30/1978    Years since quitting: 45.7   Smokeless tobacco: Never  Vaping Use   Vaping status: Never Used  Substance and Sexual Activity   Alcohol use: Yes    Comment: Light, occasional one ot two per month   Drug use: Not Currently   Sexual activity: Yes    Birth control/protection: None  Other Topics Concern   Not on file  Social History Narrative   Not on file   Social Drivers of Health   Financial Resource Strain: Low Risk  (12/12/2023)   Overall Financial Resource Strain (CARDIA)    Difficulty of Paying Living Expenses: Not hard at all  Food Insecurity: No Food Insecurity (12/12/2023)   Hunger Vital Sign    Worried About Running Out of Food in the Last Year: Never true    Ran Out of Food in the Last Year: Never true  Transportation Needs: No Transportation Needs (12/12/2023)   PRAPARE - Administrator, Civil Service (Medical): No    Lack of Transportation (Non-Medical): No  Physical Activity: Sufficiently Active (12/12/2023)   Exercise Vital Sign    Days of Exercise per Week: 7 days    Minutes of Exercise per Session: 30 min  Stress: No Stress Concern Present (12/12/2023)   [employer] of Occupational Health - Occupational Stress Questionnaire    Feeling of Stress: Not at all  Social Connections: Socially Isolated (12/12/2023)   Social Connection and Isolation Panel    Frequency of Communication with Friends and Family: Once a week    Frequency of Social Gatherings with Friends and Family: Once a week    Attends Religious Services: Never    Database administrator or Organizations: No    Attends Engineer, structural: Not on file    Marital Status: Married  Catering manager Violence: Not At Risk (12/19/2023)   Humiliation, Afraid, Rape, and Kick questionnaire    Fear of Current or Ex-Partner: No    Emotionally Abused: No    Physically Abused: No    Sexually Abused: No    Review of Systems:  All other review of systems negative except  as mentioned in the HPI.  Physical Exam: Vital signs BP (!) 144/73   Pulse 62   Temp 98 F (36.7 C) (Temporal)   Resp 12   Ht 6' 2 (1.88 m)   Wt 95 kg   SpO2 96%   BMI 26.89 kg/m   General:   Alert,  Well-developed, well-nourished, pleasant and cooperative in NAD Lungs:  Clear throughout to auscultation.   Heart:  Regular rate and rhythm; no murmurs, clicks, rubs,  or gallops. Abdomen:  Soft, nontender and nondistended. Normal bowel sounds.   Neuro/Psych:  Normal mood and affect. A and O x 3  Inocente Hausen, MD [facility]

## 2024-03-05 NOTE — Anesthesia Postprocedure Evaluation (Signed)
 Anesthesia Post Note  Patient: Dean Guzman  Procedure(s) Performed: EGD (ESOPHAGOGASTRODUODENOSCOPY) BOTOX INJECTION     Patient location during evaluation: PACU Anesthesia Type: MAC Level of consciousness: awake and alert Pain management: pain level controlled Vital Signs Assessment: post-procedure vital signs reviewed and stable Respiratory status: spontaneous breathing, nonlabored ventilation, respiratory function stable and patient connected to nasal cannula oxygen Cardiovascular status: stable and blood pressure returned to baseline Postop Assessment: no apparent nausea or vomiting Anesthetic complications: no   No notable events documented.  Last Vitals:  Vitals:   03/05/24 0932 03/05/24 0940  BP: (!) 122/54 124/65  Pulse:    Resp: 20 19  Temp:    SpO2: 96% 95%    Last Pain:  Vitals:   03/05/24 0940  TempSrc:   PainSc: 0-No pain                 [name]

## 2024-03-05 NOTE — Transfer of Care (Signed)
 Immediate Anesthesia Transfer of Care Note  Patient: Dean Guzman  Procedure(s) Performed: EGD (ESOPHAGOGASTRODUODENOSCOPY) BOTOX INJECTION  Patient Location: Endoscopy Unit  Anesthesia Type:MAC  Level of Consciousness: awake, alert , oriented, and patient cooperative  Airway & Oxygen Therapy: Patient Spontanous Breathing  Post-op Assessment: Report given to RN and Post -op Vital signs reviewed and stable  Post vital signs: Reviewed and stable  Last Vitals:  Vitals Value Taken Time  BP 122/54   Temp    Pulse 29 03/05/24 09:35  Resp 20 03/05/24 09:35  SpO2 96 % 03/05/24 09:35  Vitals shown include unfiled device data.  Last Pain:  Vitals:   03/05/24 0805  TempSrc: Temporal  PainSc: 0-No pain         Complications: No notable events documented.

## 2024-03-05 NOTE — Anesthesia Preprocedure Evaluation (Signed)
 Anesthesia Evaluation  Patient identified by MRN, date of birth, ID band Patient awake    Reviewed: Allergy  & Precautions, NPO status , Patient's Chart, lab work & pertinent test results  History of Anesthesia Complications Negative for: history of anesthetic complications  Airway Mallampati: III  TM Distance: >3 FB Neck ROM: Full    Dental  (+) Dental Advisory Given, Teeth Intact   Pulmonary neg shortness of breath, sleep apnea (patient denies) , neg COPD, neg recent URI, former smoker   Pulmonary exam normal breath sounds clear to auscultation       Cardiovascular hypertension, Pt. on medications (-) angina + CAD and + Cardiac Stents  (-) CABG + dysrhythmias Supra Ventricular Tachycardia and Ventricular Tachycardia  Rhythm:Regular Rate:Normal  HLD  TTE 09/25/2022: IMPRESSIONS    1. Left ventricular ejection fraction, by estimation, is 60 to 65%. Left  ventricular ejection fraction by 3D volume is 57 %. The left ventricle has  normal function. The left ventricle has no regional wall motion  abnormalities. Left ventricular diastolic   parameters are indeterminate. The average left ventricular global  longitudinal strain is -23.3 %. The global longitudinal strain is normal.   2. Right ventricular systolic function is normal. The right ventricular  size is normal.   3. Left atrial size was mildly dilated.   4. The mitral valve is normal in structure. No evidence of mitral valve  regurgitation. No evidence of mitral stenosis.   5. The aortic valve is normal in structure. Aortic valve regurgitation is  not visualized. No aortic stenosis is present.   6. The inferior vena cava is normal in size with greater than 50%  respiratory variability, suggesting right atrial pressure of 3 mmHg.   LHC 09/08/2021:   Prox RCA lesion is 40% stenosed.   1st Mrg lesion is 40% stenosed.   Mid LAD lesion is 80% stenosed.   Dist LAD lesion  is 90% stenosed.   A drug-eluting stent was successfully placed using a STENT ONYX FRONTIER 3.0X30.   Post intervention, there is a 0% residual stenosis.   1.  Severe proximal to mid LAD calcific stenosis hemodynamic significance confirmed by pressure wire analysis, treated successfully with intracoronary stenting using a single 3.0 x 30 mm Onyx frontier DES 2.  Mild diffuse nonobstructive plaquing involving the left circumflex and RCA with no high-grade stenoses in those vessels. 3.  Residual severe stenosis in the apical portion of the LAD appropriate for medical therapy    Neuro/Psych  Headaches, neg Seizures  Neuromuscular disease (neuropathy)    GI/Hepatic Neg liver ROS, hiatal hernia, PUD,GERD (Barrett's esophagus)  Medicated,,Recurrent pancreatitis Gastroparesis   Endo/Other  negative endocrine ROS    Renal/GU negative Renal ROS   BPH, h/o prostate cancer    Musculoskeletal   Abdominal   Peds  Hematology  (+) Blood dyscrasia, anemia Lab Results      Component                Value               Date                      WBC                      6.2                 05/11/2023  HGB                      14.0                05/11/2023                HCT                      41.8                05/11/2023                MCV                      90.1                05/11/2023                PLT                      181.0               05/11/2023              Anesthesia Other Findings Past Medical History: 02/03/2023: Anemia 08/31/2021: Atypical chest pain No date: Barrett's esophagus 07/16/2021: Barrett's esophagus without dysplasia No date: BPH (benign prostatic hyperplasia) 09/08/2021: CAD (coronary artery disease) PTCA and stenting of the  mid LAD in April 2023 08/31/2021: Change in bowel habit No date: Colon polyps No date: Coronary artery disease     Comment:  stent 4 2023 04/20/2018: Cramping of feet 11/07/2022: Decreased anal sphincter  tone 12/21/2021: Dyspnea on exertion 08/01/2018: Enteritis 04/20/2018: Essential hypertension 07/20/2023: Gastric ulcer 05/15/2023: Gastric ulcer without hemorrhage or perforation 05/15/2023: Gastritis and gastroduodenitis 07/13/2011: H/O cluster headache 11/07/2022: History of brachytherapy No date: History of hiatal hernia No date: History of kidney stones 05/15/2023: History of pancreatitis 10/05/2010: Hypercholesterolemia No date: Hyperlipidemia No date: Hypertension 07/20/2023: Idiopathic acute pancreatitis without infection or  necrosis 05/15/2023: 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 08/01/2018: Ileitis 02/03/2023: Ileus (HCC) 08/31/2021: Imaging of gastrointestinal tract abnormal 08/31/2021: Internal hemorrhoids 08/31/2021: Irritable bowel syndrome 11/07/2011: Metabolic syndrome 08/31/2021: Mucus in stool 07/21/2021: Near syncope No date: Neuromuscular disorder (HCC)     Comment:  neuropathy feet 04/20/2018: Numbness of toes 08/31/2021: Other general symptoms and signs No date: Pancreatitis 09/08/2021: Paroxysmal ventricular tachycardia (HCC) 12/25/2018: Perennial allergic rhinitis 10/05/2010: Peripheral neuropathy, idiopathic 11/07/2022: Personal history of prostate cancer No date: Pneumonia 11/07/2022: Prolapsed internal hemorrhoids, grade 4 No date: Prostate cancer (HCC) 08/31/2021: Radiation proctitis 08/31/2021: Right lower quadrant pain 11/07/2011: Sinus bradycardia     Comment:  SECONDARY TO MEDICATIONS   10/05/2010: Vitamin D  deficiency   Reproductive/Obstetrics                              Anesthesia Physical Anesthesia Plan  ASA: 3  Anesthesia Plan: MAC   Post-op Pain Management: Minimal or no pain anticipated   Induction: Intravenous  PONV Risk Score and Plan: 1 and Propofol  infusion, TIVA and Treatment may vary due to age or medical condition  Airway Management Planned: Natural Airway and Simple Face Mask  Additional  Equipment: None  Intra-op Plan:   Post-operative Plan:   Informed Consent: I have reviewed the patients History and Physical, chart, labs and discussed the procedure including the risks, benefits and alternatives for the proposed anesthesia  with the patient or authorized representative who has indicated his/her understanding and acceptance.     Dental advisory given  Plan Discussed with: CRNA and Anesthesiologist  Anesthesia Plan Comments: (Discussed with patient risks of MAC including, but not limited to, minor pain or discomfort, hearing people in the room, and possible need for backup general anesthesia. Risks for general anesthesia also discussed including, but not limited to, sore throat, hoarse voice, chipped/damaged teeth, injury to vocal cords, nausea and vomiting, allergic reactions, lung infection, heart attack, stroke, and death. All questions answered. )         Anesthesia Quick Evaluation

## 2024-03-05 NOTE — Op Note (Signed)
 [facility] Patient Name: Dean Guzman Procedure Date: 03/05/2024 MRN: [id number] Attending MD: Inocente Hausen , MD, [id number] Date of Birth: 30-Apr-1951 CSN: [id number] Age: 73 Admit Type: Outpatient Procedure:                Upper GI endoscopy Indications:              For therapy of gastroparesis, Early satiety, Nausea Providers:                Inocente Hausen, MD, Hoy Penner, RN, Coye Bade, Technician Referring MD:             Rosario Estefana Kidney Medicines:                Monitored Anesthesia Care Complications:            No immediate complications. Estimated blood loss:                            Minimal. Estimated Blood Loss:     Estimated blood loss was minimal. Procedure:                Pre-Anesthesia Assessment:                           - Prior to the procedure, a History and Physical                            was performed, and patient medications and                            allergies were reviewed. The patient's tolerance of                            previous anesthesia was also reviewed. The risks                            and benefits of the procedure and the sedation                            options and risks were discussed with the patient.                            All questions were answered, and informed consent                            was obtained. Prior Anticoagulants: The patient has                            taken no anticoagulant or antiplatelet agents                            except for aspirin . ASA Grade Assessment: III - A  patient with severe systemic disease. After                            reviewing the risks and benefits, the patient was                            deemed in satisfactory condition to undergo the                            procedure.                           After obtaining informed consent, the endoscope was                             passed under direct vision. Throughout the                            procedure, the patient's blood pressure, pulse, and                            oxygen saturations were monitored continuously. The                            [id number]-H190 ([id number]) Olympus endoscope was introduced                            through the mouth, and advanced to the second part                            of duodenum. The upper GI endoscopy was                            accomplished without difficulty. The patient                            tolerated the procedure well. Scope In: Scope Out: Findings:      The upper third of the esophagus and middle third of the esophagus were       normal.      There were esophageal mucosal changes consistent with long-segment       Barrett's esophagus present in the lower third of the esophagus. The       maximum longitudinal extent of these mucosal changes was 5 cm in length.       Esophageal biopsies were not performed as Barrett's surveillance       biopsies were recently completed 04/2023 and no suspicious lesions were       seen.      A small amount of food (residue) and fluid was found in the gastric body       and in the gastric antrum.      Normal mucosa was found in the gastric body, in the gastric antrum, in       the cardia (on retroflexion) and in the gastric fundus (on retroflexion).      Evidence of a previous fundoplication was seen in the gastric  cardia/fundus on retroflexion. Medium size hiatal hernia present.      An area at the pylorus was successfully injected with 100 units       botulinum toxin - 25 units of botulinum toxin were injected in 4       quadrants around the pylorus.      The duodenal bulb and second portion of the duodenum were normal. Impression:               - Normal upper third of esophagus and middle third                            of esophagus.                           - Esophageal mucosal changes consistent with                             long-segment Barrett's esophagus.                           - Evidence of previous fundoplication on                            retroflexion with medium size hiatal hernia present.                           - A small amount of food (residue) in the stomach.                           - Normal mucosa was found in the gastric body, in                            the antrum, in the cardia and in the gastric fundus.                           - Normal duodenal bulb and second portion of the                            duodenum.                           - An area in the pylorus successfully injected with                            botulinum toxin.                           - No specimens collected. Moderate Sedation:      Not Applicable - Patient had care per Anesthesia. Recommendation:           - Discharge patient to home (ambulatory).                           - Continue gastroparesis diet.                           -  The findings and recommendations were discussed                            with the patient.                           - Patient has a contact number available for                            emergencies. The signs and symptoms of potential                            delayed complications were discussed with the                            patient. Return to normal activities tomorrow.                            Written discharge instructions were provided to the                            patient. Procedure Code(s):        --- Professional ---                           [phone], Esophagogastroduodenoscopy, flexible,                            transoral; with directed submucosal injection(s),                            any substance Diagnosis Code(s):        --- Professional ---                           K22.89, Other specified disease of esophagus                           K31.84, Gastroparesis                           R68.81, Early satiety                            R11.0, Nausea CPT copyright 2022 [employer]. All rights reserved. The codes documented in this report are preliminary and upon coder review may  be revised to meet current compliance requirements. Inocente Hausen, MD 03/05/2024 9:36:16 AM This report has been signed electronically. Number of Addenda: 0

## 2024-03-05 NOTE — Anesthesia Procedure Notes (Signed)
 Procedure Name: MAC Date/Time: 03/05/2024 9:10 AM  Performed by: Nada Corean CROME, CRNAPre-anesthesia Checklist: Patient identified, Emergency Drugs available, Suction available, Patient being monitored and Timeout performed Patient Re-evaluated:Patient Re-evaluated prior to induction Oxygen Delivery Method: Simple face mask Preoxygenation: Pre-oxygenation with 100% oxygen Induction Type: IV induction Placement Confirmation: positive ETCO2 and breath sounds checked- equal and bilateral Dental Injury: Teeth and Oropharynx as per pre-operative assessment  Comments: POM mask used

## 2024-03-07 ENCOUNTER — Encounter (HOSPITAL_COMMUNITY): Payer: Self-pay | Admitting: Pediatrics

## 2024-03-22 ENCOUNTER — Encounter: Payer: Self-pay | Admitting: Family Medicine

## 2024-03-22 ENCOUNTER — Ambulatory Visit: Admitting: Family Medicine

## 2024-03-22 VITALS — BP 126/74 | HR 87 | Temp 97.6°F | Resp 16 | Ht <= 58 in | Wt 212.2 lb

## 2024-03-22 DIAGNOSIS — M542 Cervicalgia: Secondary | ICD-10-CM | POA: Diagnosis not present

## 2024-03-22 MED ORDER — IPRATROPIUM BROMIDE 0.06 % NA SOLN: 2.0000 | Freq: Three times a day (TID) | NASAL | 3 refills | Status: AC

## 2024-03-22 NOTE — Progress Notes (Signed)
 Musculoskeletal Exam  Patient: Dean Guzman DOB: 1950-12-25  DOS: 03/22/2024  SUBJECTIVE:  Chief Complaint:   Chief Complaint  Patient presents with   Follow-up    Follow Up and Right Neck Pain    Dean Guzman is a 73 y.o.  male for evaluation and treatment of neck pain.   Onset:  3 months ago. No inj or change in activity.  Location: R side of neck Character:  aching  Progression of issue:  is unchanged Associated symptoms: slightly decreased ROM w rotation to the R No bruising, redness, swelling Treatment: to date has been: none.   Neurovascular symptoms: no  Past Medical History:  Diagnosis Date   Anemia 02/03/2023   Atypical chest pain 08/31/2021   Barrett's esophagus    Barrett's esophagus without dysplasia 07/16/2021   BPH (benign prostatic hyperplasia)    CAD (coronary artery disease) PTCA and stenting of the mid LAD in April 2023 09/08/2021   Change in bowel habit 08/31/2021   Colon polyps    Coronary artery disease    stent 4 2023   Cramping of feet 04/20/2018   Decreased anal sphincter tone 11/07/2022   Dyspnea on exertion 12/21/2021   Enteritis 08/01/2018   Essential hypertension 04/20/2018   Gastric ulcer 07/20/2023   Gastric ulcer without hemorrhage or perforation 05/15/2023   Gastritis and gastroduodenitis 05/15/2023   H/O cluster headache 07/13/2011   History of brachytherapy 11/07/2022   History of hiatal hernia    History of kidney stones    History of pancreatitis 05/15/2023   Hypercholesterolemia 10/05/2010   Hyperlipidemia    Hypertension    Idiopathic acute pancreatitis without infection or necrosis 07/20/2023   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 05/15/2023   Ileitis 08/01/2018   Ileus (HCC) 02/03/2023   Imaging of gastrointestinal tract abnormal 08/31/2021   Internal hemorrhoids 08/31/2021   Irritable bowel syndrome 08/31/2021   Metabolic syndrome 11/07/2011   Mucus in stool 08/31/2021   Near syncope 07/21/2021   Neuromuscular  disorder (HCC)    neuropathy feet   Numbness of toes 04/20/2018   Other general symptoms and signs 08/31/2021   Pancreatitis    Paroxysmal ventricular tachycardia (HCC) 09/08/2021   Perennial allergic rhinitis 12/25/2018   Peripheral neuropathy, idiopathic 10/05/2010   Personal history of prostate cancer 11/07/2022   Pneumonia    Prolapsed internal hemorrhoids, grade 4 11/07/2022   Prostate cancer (HCC)    Radiation proctitis 08/31/2021   Right lower quadrant pain 08/31/2021   Sinus bradycardia 11/07/2011   SECONDARY TO MEDICATIONS     Vitamin D  deficiency 10/05/2010    Objective: VITAL SIGNS: BP 126/74 (BP Location: Left Arm, Patient Position: Sitting)   Pulse 87   Temp 97.6 F (36.4 C) (Oral)   Resp 16   Ht 1' (0.305 m)   Wt 212 lb 3.2 oz (96.3 kg)   SpO2 96%   BMI 1036.06 kg/m  Constitutional: Well formed, well developed. No acute distress. Thorax & Lungs: No accessory muscle use Musculoskeletal: neck.   Normal active range of motion: decreased R rotation.   Tenderness to palpation: yes over lateral neck and SCM on R Deformity: no Ecchymosis: no Tests positive: none Tests negative: Spurling's Neurologic: Normal sensory function. No focal deficits noted. DTR's equal and symmetric in UE's. No clonus. Psychiatric: Normal mood. Age appropriate judgment and insight. Alert & oriented x 3.    Assessment:  Neck pain on right side  Plan: Stretches/exercises, heat, ice, Tylenol . PT if no better by next mo.  F/u in 6 mo- I no longer rx him chronic meds so will likely see him 1x/yr for CPE. The patient voiced understanding and agreement to the plan.   Mabel [place], DO 03/22/24  9:50 AM

## 2024-03-22 NOTE — Patient Instructions (Signed)
 Heat (pad or rice pillow in microwave) over affected area, 10-15 minutes twice daily.   Ice/cold pack over area for 10-15 min twice daily.  OK to take Tylenol  1000 mg (2 extra strength tabs) or 975 mg (3 regular strength tabs) every 6 hours as needed.  Send me a message in 1 month if not obviously better.  Let us  know if you need anything.  EXERCISES RANGE OF MOTION (ROM) AND STRETCHING EXERCISES  These exercises may help you when beginning to rehabilitate your issue. In order to successfully resolve your symptoms, you must improve your posture. These exercises are designed to help reduce the forward-head and rounded-shoulder posture which contributes to this condition. Your symptoms may resolve with or without further involvement from your physician, physical therapist or athletic trainer. While completing these exercises, remember:  Restoring tissue flexibility helps normal motion to return to the joints. This allows healthier, less painful movement and activity. An effective stretch should be held for at least 20 seconds, although you may need to begin with shorter hold times for comfort. A stretch should never be painful. You should only feel a gentle lengthening or release in the stretched tissue. Do not do any stretch or exercise that you cannot tolerate.  STRETCH- Axial Extensors Lie on your back on the floor. You may bend your knees for comfort. Place a rolled-up hand towel or dish towel, about 2 inches in diameter, under the part of your head that makes contact with the floor. Gently tuck your chin, as if trying to make a double chin, until you feel a gentle stretch at the base of your head. Hold 15-20 seconds. Repeat 2-3 times. Complete this exercise 1 time per day.   STRETCH - Axial Extension  Stand or sit on a firm surface. Assume a good posture: chest up, shoulders drawn back, abdominal muscles slightly tense, knees unlocked (if standing) and feet hip width apart. Slowly  retract your chin so your head slides back and your chin slightly lowers. Continue to look straight ahead. You should feel a gentle stretch in the back of your head. Be certain not to feel an aggressive stretch since this can cause headaches later. Hold for 15-20 seconds. Repeat 2-3 times. Complete this exercise 1 time per day.  STRETCH - Cervical Side Bend  Stand or sit on a firm surface. Assume a good posture: chest up, shoulders drawn back, abdominal muscles slightly tense, knees unlocked (if standing) and feet hip width apart. Without letting your nose or shoulders move, slowly tip your right / left ear to your shoulder until your feel a gentle stretch in the muscles on the opposite side of your neck. Hold 15-20 seconds. Repeat 2-3 times. Complete this exercise 1-2 times per day.  STRETCH - Cervical Rotators  Stand or sit on a firm surface. Assume a good posture: chest up, shoulders drawn back, abdominal muscles slightly tense, knees unlocked (if standing) and feet hip width apart. Keeping your eyes level with the ground, slowly turn your head until you feel a gentle stretch along the back and opposite side of your neck. Hold 15-20 seconds. Repeat 2-3 times. Complete this exercise 1-2 times per day.  RANGE OF MOTION - Neck Circles  Stand or sit on a firm surface. Assume a good posture: chest up, shoulders drawn back, abdominal muscles slightly tense, knees unlocked (if standing) and feet hip width apart. Gently roll your head down and around from the back of one shoulder to the back of the  other. The motion should never be forced or painful. Repeat the motion 10-20 times, or until you feel the neck muscles relax and loosen. Repeat 2-3 times. Complete the exercise 1-2 times per day. STRENGTHENING EXERCISES - Cervical Strain and Sprain These exercises may help you when beginning to rehabilitate your injury. They may resolve your symptoms with or without further involvement from your  physician, physical therapist, or athletic trainer. While completing these exercises, remember:  Muscles can gain both the endurance and the strength needed for everyday activities through controlled exercises. Complete these exercises as instructed by your physician, physical therapist, or athletic trainer. Progress the resistance and repetitions only as guided. You may experience muscle soreness or fatigue, but the pain or discomfort you are trying to eliminate should never worsen during these exercises. If this pain does worsen, stop and make certain you are following the directions exactly. If the pain is still present after adjustments, discontinue the exercise until you can discuss the trouble with your clinician.  STRENGTH - Cervical Flexors, Isometric Face a wall, standing about 6 inches away. Place a small pillow, a ball about 6-8 inches in diameter, or a folded towel between your forehead and the wall. Slightly tuck your chin and gently push your forehead into the soft object. Push only with mild to moderate intensity, building up tension gradually. Keep your jaw and forehead relaxed. Hold 10 to 20 seconds. Keep your breathing relaxed. Release the tension slowly. Relax your neck muscles completely before you start the next repetition. Repeat 2-3 times. Complete this exercise 1 time per day.  STRENGTH- Cervical Lateral Flexors, Isometric  Stand about 6 inches away from a wall. Place a small pillow, a ball about 6-8 inches in diameter, or a folded towel between the side of your head and the wall. Slightly tuck your chin and gently tilt your head into the soft object. Push only with mild to moderate intensity, building up tension gradually. Keep your jaw and forehead relaxed. Hold 10 to 20 seconds. Keep your breathing relaxed. Release the tension slowly. Relax your neck muscles completely before you start the next repetition. Repeat 2-3 times. Complete this exercise 1 time per  day.  STRENGTH - Cervical Extensors, Isometric  Stand about 6 inches away from a wall. Place a small pillow, a ball about 6-8 inches in diameter, or a folded towel between the back of your head and the wall. Slightly tuck your chin and gently tilt your head back into the soft object. Push only with mild to moderate intensity, building up tension gradually. Keep your jaw and forehead relaxed. Hold 10 to 20 seconds. Keep your breathing relaxed. Release the tension slowly. Relax your neck muscles completely before you start the next repetition. Repeat 2-3 times. Complete this exercise 1 time per day.  POSTURE AND BODY MECHANICS CONSIDERATIONS Keeping correct posture when sitting, standing or completing your activities will reduce the stress put on different body tissues, allowing injured tissues a chance to heal and limiting painful experiences. The following are general guidelines for improved posture. Your physician or physical therapist will provide you with any instructions specific to your needs. While reading these guidelines, remember: The exercises prescribed by your provider will help you have the flexibility and strength to maintain correct postures. The correct posture provides the optimal environment for your joints to work. All of your joints have less wear and tear when properly supported by a spine with good posture. This means you will experience a healthier, less painful  body. Correct posture must be practiced with all of your activities, especially prolonged sitting and standing. Correct posture is as important when doing repetitive low-stress activities (typing) as it is when doing a single heavy-load activity (lifting).  PROLONGED STANDING WHILE SLIGHTLY LEANING FORWARD When completing a task that requires you to lean forward while standing in one place for a long time, place either foot up on a stationary 2- to 4-inch high object to help maintain the best posture. When both feet are  on the ground, the low back tends to lose its slight inward curve. If this curve flattens (or becomes too large), then the back and your other joints will experience too much stress, fatigue more quickly, and can cause pain.   RESTING POSITIONS Consider which positions are most painful for you when choosing a resting position. If you have pain with flexion-based activities (sitting, bending, stooping, squatting), choose a position that allows you to rest in a less flexed posture. You would want to avoid curling into a fetal position on your side. If your pain worsens with extension-based activities (prolonged standing, working overhead), avoid resting in an extended position such as sleeping on your stomach. Most people will find more comfort when they rest with their spine in a more neutral position, neither too rounded nor too arched. Lying on a non-sagging bed on your side with a pillow between your knees, or on your back with a pillow under your knees will often provide some relief. Keep in mind, being in any one position for a prolonged period of time, no matter how correct your posture, can still lead to stiffness.  WALKING Walk with an upright posture. Your ears, shoulders, and hips should all line up. OFFICE WORK When working at a desk, create an environment that supports good, upright posture. Without extra support, muscles fatigue and lead to excessive strain on joints and other tissues.  CHAIR: A chair should be able to slide under your desk when your back makes contact with the back of the chair. This allows you to work closely. The chair's height should allow your eyes to be level with the upper part of your monitor and your hands to be slightly lower than your elbows. Body position: Your feet should make contact with the floor. If this is not possible, use a foot rest. Keep your ears over your shoulders. This will reduce stress on your neck and low back.

## 2024-03-27 ENCOUNTER — Encounter: Payer: Self-pay | Admitting: Internal Medicine

## 2024-03-27 DIAGNOSIS — R197 Diarrhea, unspecified: Secondary | ICD-10-CM

## 2024-03-27 DIAGNOSIS — Z8719 Personal history of other diseases of the digestive system: Secondary | ICD-10-CM

## 2024-03-27 DIAGNOSIS — R109 Unspecified abdominal pain: Secondary | ICD-10-CM

## 2024-03-27 DIAGNOSIS — K5732 Diverticulitis of large intestine without perforation or abscess without bleeding: Secondary | ICD-10-CM

## 2024-03-27 NOTE — Telephone Encounter (Signed)
 Patient last seen by Dr. Federico 6//2025 for history of prior pancreatitis gastroparesis and Barrett's esophagus.  Had an ileus and pancreatitis at [facility] 9/5 through 02/05/19/2024. EGD/EUS 04/2023 1 cm hiatal hernia medium out of food nonbleeding gastric ulcer.  EUS pancreatic abnormalities consistent with cyst normal gallbladder no cholelithiasis no malignancy.  EUS without malignant cells  Patient is also had acute uncomplicated sigmoid diverticulitis 03/15/2023 with resolution 04/19/2023. 04/2023 colonoscopy colonic mucosa mild edema no active inflammation chronic changes likely from diverticulitis.  Please get a CBC c-Met sed rate lipase and amylase to evaluate for diverticulitis and pancreatitis. Please do diverticulitis or liquid diet will give information. Pending labs and response can consider repeat CT abdomen pelvis. Is the patient still on pantoprazole  40 mg daily?  Is patient on Motegrity ?  Does he have Reglan  does he needs it?   During an Acute Flare-Up (Clear Liquid to Low-Fiber Diet) The goal is to reduce irritation and let your colon rest.  Day 1-3: Clear Liquid Diet Water  Broth (chicken, beef, or vegetable)  Clear juices (apple, white grape - avoid citrus)  Ice pops without pulp or seeds  Gelatin (no fruit or seeds)  Tea or coffee (no cream or dairy)  After Symptoms Improve: Low-Fiber Diet Gradually transition to low-fiber foods for easier digestion.  Sample Foods White rice, pasta, or plain white bread  Cooked or canned vegetables without skins or seeds (e.g., carrots, green beans, potatoes)  Eggs, fish, or poultry  Low-fiber cereals (like cornflakes)  Dairy (if tolerated)  Ripe bananas, melon, or canned fruit without seeds  Long-Term Maintenance: High-Fiber Diet (Once Fully Recovered) This helps prevent future flare-ups by keeping the bowel movements soft and regular.  High-Fiber Foods Fruits: Apples (peeled), pears, berries,  prunes  Vegetables: Broccoli, spinach, zucchini, peas  Whole grains: Oatmeal, brown rice, quinoa, whole wheat bread  Legumes: Lentils, chickpeas, black beans (start slowly to avoid gas)  Nuts & seeds: Only if tolerated (research no longer restricts them, but if you feel they cause a flare do not eat them)

## 2024-03-28 ENCOUNTER — Ambulatory Visit: Payer: Self-pay | Admitting: Physician Assistant

## 2024-03-28 ENCOUNTER — Other Ambulatory Visit (INDEPENDENT_AMBULATORY_CARE_PROVIDER_SITE_OTHER)

## 2024-03-28 DIAGNOSIS — R109 Unspecified abdominal pain: Secondary | ICD-10-CM

## 2024-03-28 DIAGNOSIS — Z8719 Personal history of other diseases of the digestive system: Secondary | ICD-10-CM | POA: Diagnosis not present

## 2024-03-28 DIAGNOSIS — K5732 Diverticulitis of large intestine without perforation or abscess without bleeding: Secondary | ICD-10-CM | POA: Diagnosis not present

## 2024-03-28 DIAGNOSIS — R197 Diarrhea, unspecified: Secondary | ICD-10-CM

## 2024-03-28 LAB — COMPREHENSIVE METABOLIC PANEL WITH GFR
ALT: 33 U/L (ref 0–53)
AST: 25 U/L (ref 0–37)
Albumin: 4.4 g/dL (ref 3.5–5.2)
Alkaline Phosphatase: 54 U/L (ref 39–117)
BUN: 13 mg/dL (ref 6–23)
CO2: 33 meq/L — ABNORMAL HIGH (ref 19–32)
Calcium: 9.7 mg/dL (ref 8.4–10.5)
Chloride: 100 meq/L (ref 96–112)
Creatinine, Ser: 1.03 mg/dL (ref 0.40–1.50)
GFR: 72.05 mL/min (ref >60.00)
Glucose, Bld: 119 mg/dL — ABNORMAL HIGH (ref 70–99)
Potassium: 4.5 meq/L (ref 3.5–5.1)
Sodium: 138 meq/L (ref 135–145)
Total Bilirubin: 0.8 mg/dL (ref 0.2–1.2)
Total Protein: 7.2 g/dL (ref 6.0–8.3)

## 2024-03-28 LAB — CBC WITH DIFFERENTIAL/PLATELET
Basophils Absolute: 0 K/uL (ref 0.0–0.1)
Basophils Relative: 0.6 % (ref 0.0–3.0)
Eosinophils Absolute: 0.2 K/uL (ref 0.0–0.7)
Eosinophils Relative: 3.1 % (ref 0.0–5.0)
HCT: 46.5 % (ref 39.0–52.0)
Hemoglobin: 15.7 g/dL (ref 13.0–17.0)
Lymphocytes Relative: 13.3 % (ref 12.0–46.0)
Lymphs Abs: 0.8 K/uL (ref 0.7–4.0)
MCHC: 33.7 g/dL (ref 30.0–36.0)
MCV: 91.3 fl (ref 78.0–100.0)
Monocytes Absolute: 0.6 K/uL (ref 0.1–1.0)
Monocytes Relative: 9.5 % (ref 3.0–12.0)
Neutro Abs: 4.4 K/uL (ref 1.4–7.7)
Neutrophils Relative %: 73.5 % (ref 43.0–77.0)
Platelets: 157 K/uL (ref 150.0–400.0)
RBC: 5.09 Mil/uL (ref 4.22–5.81)
RDW: 13.6 % (ref 11.5–15.5)
WBC: 6 K/uL (ref 4.0–10.5)

## 2024-03-28 LAB — SEDIMENTATION RATE: Sed Rate: 5 mm/h (ref 0–20)

## 2024-03-28 LAB — AMYLASE: Amylase: 20 U/L — ABNORMAL LOW (ref 27–131)

## 2024-03-28 LAB — LIPASE: Lipase: 24 U/L (ref 11.0–59.0)

## 2024-03-28 NOTE — Telephone Encounter (Signed)
 CT order in epic. Urgent secure staff message sent to radiology scheduling to contact patient ASAP to schedule.

## 2024-03-29 ENCOUNTER — Other Ambulatory Visit: Payer: Self-pay | Admitting: Cardiology

## 2024-03-29 LAB — C. DIFFICILE GDH AND TOXIN A/B
GDH ANTIGEN: NOT DETECTED
MICRO NUMBER:: 17169867
SPECIMEN QUALITY:: ADEQUATE
TOXIN A AND B: NOT DETECTED

## 2024-04-01 LAB — STOOL CULTURE: E coli, Shiga toxin Assay: NEGATIVE

## 2024-04-02 ENCOUNTER — Telehealth: Payer: Self-pay

## 2024-04-02 NOTE — Telephone Encounter (Signed)
 See 11/4 TE for details.

## 2024-04-02 NOTE — Telephone Encounter (Signed)
 Referral, records, patient's demographics and insurance information faxed to [facility] in [place].    P: [phone] F: [phone]   MyChart message sent to patient with referral information

## 2024-04-02 NOTE — Telephone Encounter (Signed)
 Patient with history of gastroparesis previous paraesophageal hiatal hernia status postrepair.  Patient has tried Reglan , Motegrity , diet, erythromycin  without help. Please refer patient to [facility] motility clinic.

## 2024-04-03 ENCOUNTER — Ambulatory Visit (HOSPITAL_COMMUNITY)

## 2024-04-03 NOTE — Telephone Encounter (Signed)
 Referral, records, patient's demographics, and insurance information have been faxed to [facility] with request for motility clinic.  P: [phone]     F: [phone]

## 2024-04-03 NOTE — Telephone Encounter (Signed)
 Received a fax from [facility] that they are not currently accepting gastroparesis patients. I did call [facility] and they are accepting new referrals. Will reach out to patient to see if he is OK with referral to [facility].

## 2024-04-07 ENCOUNTER — Encounter: Payer: Self-pay | Admitting: Cardiology

## 2024-04-11 NOTE — Telephone Encounter (Signed)
 [facility] referral faxed again.

## 2024-04-18 ENCOUNTER — Telehealth: Payer: Self-pay

## 2024-04-18 NOTE — Telephone Encounter (Signed)
 Called [facility] at [phone] to follow up on referral to Motility Clinic. I spoke with Crystaline and she informed me that the referral was closed because they are not taking second opinions at this time due to capacity. This was the same issue with [facility]. Please advise, thanks.

## 2024-04-19 NOTE — Telephone Encounter (Signed)
 Pl try [facility]

## 2024-04-22 NOTE — Telephone Encounter (Signed)
 See 11/4 telephone encounter - patient is coming in for an office visit on 12/3/5 with Alan Coombs, PA to discuss next steps further.

## 2024-04-30 ENCOUNTER — Other Ambulatory Visit: Payer: Self-pay | Admitting: Cardiology

## 2024-05-01 ENCOUNTER — Encounter: Payer: Self-pay | Admitting: Physician Assistant

## 2024-05-01 ENCOUNTER — Ambulatory Visit
Admission: RE | Admit: 2024-05-01 | Discharge: 2024-05-01 | Disposition: A | Source: Ambulatory Visit | Attending: Physician Assistant | Admitting: Physician Assistant

## 2024-05-01 ENCOUNTER — Ambulatory Visit: Admitting: Physician Assistant

## 2024-05-01 VITALS — BP 130/78 | HR 87 | Ht 74.0 in | Wt 215.0 lb

## 2024-05-01 DIAGNOSIS — R634 Abnormal weight loss: Secondary | ICD-10-CM

## 2024-05-01 DIAGNOSIS — K5900 Constipation, unspecified: Secondary | ICD-10-CM | POA: Diagnosis not present

## 2024-05-01 DIAGNOSIS — R109 Unspecified abdominal pain: Secondary | ICD-10-CM

## 2024-05-01 DIAGNOSIS — K3184 Gastroparesis: Secondary | ICD-10-CM

## 2024-05-01 DIAGNOSIS — R1084 Generalized abdominal pain: Secondary | ICD-10-CM

## 2024-05-01 DIAGNOSIS — K580 Irritable bowel syndrome with diarrhea: Secondary | ICD-10-CM

## 2024-05-01 DIAGNOSIS — R197 Diarrhea, unspecified: Secondary | ICD-10-CM

## 2024-05-01 NOTE — Progress Notes (Signed)
 05/01/2024 Dean Guzman Dean Guzman Guzman [id number] January 12, 1951  Referring provider: Frann Mabel Guzman* Primary GI doctor: Dean Guzman Dean Guzman Guzman  ASSESSMENT AND PLAN:  GERD status post fundoplication 04/2022/Barrett's esophagus early satiety, nausea without vomiting, worse after eating, worse at night 05/19/2022 Dean Guzman Dean Guzman Guzman s/p fundiplication 06/21/2022 surgery Dean Guzman Dean Guzman Guzman for incarcerated hiatal hernia 05/20/2022 barium swallow expected postoperative appearance status post medication no showing leak or complication 07/20/2023 EGD with EUS esophageal mucosal changes consistent with long segment Barrett's esophagus distally 2 cm HH scar in the gastric fundus erythematous mucosa in cardia no other gross abnormalities normal duodenum, previously negative H. pylori, previous Barrett's 10/2022 negative for dysplasia - mesentary AB US , rule out MALS, consider CTA with inspiration - Get UGI to evaluate fundoplication, movement, etc - will refer back to [facility] for further evaluation  Loose stools, can have days without BM -Trial of creon  without help Trial of colestipol  without help Trial of xifaxin and augmentin  875 mg TID for possible SIBO did not help - get KUB  Gastroparesis 06/05/2023 delayed gastric emptying 6//2025 Reglan  stopped due to pursing of lips 6//2025 switch to Motegrity , states did not help 11/26/2023 attempt of domperidone without help 01/22/2024 erythromycin  250 mg 3 times daily before meals for 2 weeks and then stop for 2 weeks attempted without help- had AB pain 03/05/2024 EGD with Dean Guzman Dean Guzman Guzman in the hospitalWith Botox  injection without help  tried referred to [facility]  but no openings -UGI with contrast - consider referral to [facility] motility  Diverticulosis 03/15/2023 CTAP W uncomplicated sigmoid diverticulitis repeat 04/19/2023 resolution without any other complications  History of idiopathic pancreatitis with pancreatic cyst, elevated IgG4 05/15/2023 EUS pancreatic parenchymal  abnormalities consistent subsist and hyperechoic foci distal pancreatic body/tail no significant parenchymal abnormality had genu and proximal body main pancreatic diameter torturous appearance only in the tail region no sign of significant pathology in common bile ducts gallbladder normal without cholelithiasis no malignant appearing lymph nodes 07/20/2023 EUS with 5 and in setting of previously elevated IgG4 and idiopathic pancreatitis final microscopic diagnosis no malignant cells identified  IgG4 stain negative Creon  sample without help  Constipation/diarrhea Previously completed pelvic floor physical therapy S/p hemorrhiodectomy with Dean Guzman Dean Guzman Guzman  Personal history of colon polyps 05/03/2023 colonoscopy normal TI diverticulosis sigmoid descending ascending colon nonbleeding internal hemorrhoids biopsies negative for inflammation or chronic changes possible resolving infectious colitis  Patient Care Team: Dean Guzman Mabel Mt, DO as PCP - General (Family Medicine) Dean Guzman Dean Guzman Guzman Standing, MD as Consulting Physician (General Surgery) Dean Guzman Specking, MD as Consulting Physician (Gastroenterology) Dean Guzman Lamar PARAS, MD as Consulting Physician (Cardiology)  HISTORY OF PRESENT ILLNESS: 73 y.o. male with a past medical history listed of  73 year old male with history of paraesophageal hiatal hernia s/p hiatal hernia surgery and Toupet fundoplication, prostate cancer s/p radiation, CAD s/p PCI, prior pancreatitis, gastroparesis, and Barrett's esophagus and others below presents for evaluation of gastroparesis/Endoflip.   11/01/2023 last seen in the office by Dean Guzman Dean Guzman Guzman for abdominal pain and diarrhea.  Discussed the use of AI scribe software for clinical note transcription with the patient, who gave verbal consent to proceed.  History of Present Illness   Dean Guzman Dean Guzman Guzman is a 73 year old male who presents with persistent gastrointestinal symptoms following hiatal hernia repair and  fundoplication.  Following a robotic hiatal hernia repair with a 270-degree Toupet fundoplication and gastropexy, she experienced early satiety, significant weight loss, and persistent gastrointestinal symptoms. She is unable to eat more than half a hamburger without intense pain, pressure, and gas. Post-surgery,  she lost 46 pounds and was hospitalized at [facility] for severe abdominal pain, diarrhea, and suspected obstruction. Diagnoses included ileus, duodenitis, and pancreatitis, treated with NG suction, bowel rest, and IV fluids. Despite treatment, diarrhea and intense abdominal pain persisted for three and a half months.  She has tried various medications including dicyclomine , Reglan , domperidone, Motegrity , erythromycin , Creon , and Xifaxan , with little to no relief. Botox  injections were also attempted without success. Frequent nausea occurs, especially when lying down, and she experiences intermittent loose stools that are foul-smelling and disintegrate in water. Her weight has fluctuated, requiring high-calorie foods to maintain it. Her weight has increased to around 200 pounds, but she must eat high-fat foods like ice cream to prevent weight loss.  Multiple diagnostic procedures have been performed, including EUS EGD, which showed food retention in the stomach and a healed ulcer. A gastric emptying test was performed. Despite dietary modifications, including a liquid diet, symptoms persist. She has not experienced reflux or trouble swallowing but reports nausea that is often worse after eating and sometimes occurs randomly. No fever or chills. Bowel movements are consistently loose, with no hard stools, and she occasionally experiences days without a bowel movement.        Dean Guzman Guzman  reports that Dean Guzman Guzman quit smoking about 45 years ago. Dean Guzman Dean Guzman Guzman's smoking use included cigarettes. Dean Guzman Guzman started smoking about 57 years ago. Dean Guzman Guzman has a 12 pack-year smoking history. Dean Guzman Dean Guzman Guzman has never used smokeless tobacco.  Dean Guzman Dean Guzman Guzman reports current alcohol use. Dean Guzman Dean Guzman Guzman reports that Dean Guzman Guzman does not currently use drugs.  RELEVANT GI HISTORY, IMAGING AND LABS: Results   LABS Lipase: 849  RADIOLOGY Abdominal CT: Massive gastrointestinal distension, inflammatory changes  DIAGNOSTIC EUS EGD: Food retention in stomach, gastric ulcer (05/15/2023) Gastric emptying study: Indicative of gastroparesis (06/05/2023)      CBC    Component Value Date/Time   WBC 6.0 03/28/2024 0843   RBC 5.09 03/28/2024 0843   HGB 15.7 03/28/2024 0843   HGB 14.6 09/02/2021 1148   HCT 46.5 03/28/2024 0843   HCT 45.1 09/02/2021 1148   PLT 157.0 03/28/2024 0843   PLT 208 09/02/2021 1148   MCV 91.3 03/28/2024 0843   MCV 89 09/02/2021 1148   MCH 29.9 02/10/2023 1352   MCHC 33.7 03/28/2024 0843   RDW 13.6 03/28/2024 0843   RDW 12.5 09/02/2021 1148   LYMPHSABS 0.8 03/28/2024 0843   LYMPHSABS 1.2 09/02/2021 1148   MONOABS 0.6 03/28/2024 0843   EOSABS 0.2 03/28/2024 0843   EOSABS 0.1 09/02/2021 1148   BASOSABS 0.0 03/28/2024 0843   BASOSABS 0.0 09/02/2021 1148   Recent Labs    05/11/23 1415 08/22/23 1109 03/28/24 0843  HGB 14.0 14.7 15.7    CMP     Component Value Date/Time   NA 138 03/28/2024 0843   NA 138 02/19/2024 0954   K 4.5 03/28/2024 0843   CL 100 03/28/2024 0843   CO2 33 (H) 03/28/2024 0843   GLUCOSE 119 (H) 03/28/2024 0843   BUN 13 03/28/2024 0843   BUN 16 02/19/2024 0954   CREATININE 1.03 03/28/2024 0843   CREATININE 1.15 07/31/2018 1617   CALCIUM  9.7 03/28/2024 0843   PROT 7.2 03/28/2024 0843   ALBUMIN 4.4 03/28/2024 0843   AST 25 03/28/2024 0843   ALT 33 03/28/2024 0843   ALKPHOS 54 03/28/2024 0843   BILITOT 0.8 03/28/2024 0843   GFRNONAA 42 (L) 06/15/2022 1105   GFRAA >60 01/09/2020 0926      Latest Ref Rng & Units 03/28/2024  8:43 AM 08/22/2023   11:09 AM 05/11/2023    2:15 PM  Hepatic Function  Total Protein 6.0 - 8.3 g/dL 7.2  6.7  7.0   Albumin 3.5 - 5.2 g/dL 4.4  4.5  4.2   AST 0 - 37  U/L 25  20  16    ALT 0 - 53 U/L 33  23  16   Alk Phosphatase 39 - 117 U/L 54  66  69   Total Bilirubin 0.2 - 1.2 mg/dL 0.8  0.5  0.5       Current Medications:    Current Outpatient Medications (Cardiovascular):    ezetimibe  (ZETIA ) 10 MG tablet, TAKE 1 TABLET BY MOUTH EVERY DAY   lisinopril  (ZESTRIL ) 5 MG tablet, Take 1 tablet (5 mg total) by mouth daily.   rosuvastatin  (CRESTOR ) 20 MG tablet, Take 1 tablet (20 mg total) by mouth daily.   hydrochlorothiazide  (MICROZIDE ) 12.5 MG capsule, Take 1 capsule (12.5 mg total) by mouth daily.  Current Outpatient Medications (Respiratory):    ipratropium (ATROVENT ) 0.06 % nasal spray, Place 2 sprays into the nose 3 (three) times daily.  Current Outpatient Medications (Analgesics):    aspirin  EC 81 MG tablet, Take 81 mg by mouth daily. Swallow whole.   Current Outpatient Medications (Other):    cholecalciferol  (VITAMIN D3) 25 MCG (1000 UT) tablet, Take 1,000 Units by mouth daily.   ondansetron  (ZOFRAN -ODT) 4 MG disintegrating tablet, Take 1 tablet (4 mg total) by mouth every 8 (eight) hours as needed for nausea or vomiting.   pantoprazole  (PROTONIX ) 40 MG tablet, Take 1 tablet (40 mg total) by mouth daily.   Vibegron (GEMTESA) 75 MG TABS, Take 75 mg by mouth daily.  Medical History:  Past Medical History:  Diagnosis Date   Anemia 02/03/2023   Atypical chest pain 08/31/2021   Barrett's esophagus    Barrett's esophagus without dysplasia 07/16/2021   BPH (benign prostatic hyperplasia)    CAD (coronary artery disease) PTCA and stenting of the mid LAD in April 2023 09/08/2021   Change in bowel habit 08/31/2021   Colon polyps    Coronary artery disease    stent 4 2023   Cramping of feet 04/20/2018   Decreased anal sphincter tone 11/07/2022   Dyspnea on exertion 12/21/2021   Enteritis 08/01/2018   Essential hypertension 04/20/2018   Gastric ulcer 07/20/2023   Gastric ulcer without hemorrhage or perforation 05/15/2023   Gastritis and  gastroduodenitis 05/15/2023   H/O cluster headache 07/13/2011   History of brachytherapy 11/07/2022   History of hiatal hernia    History of kidney stones    History of pancreatitis 05/15/2023   Hypercholesterolemia 10/05/2010   Hyperlipidemia    Hypertension    Idiopathic acute pancreatitis without infection or necrosis 07/20/2023   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 05/15/2023   Ileitis 08/01/2018   Ileus (HCC) 02/03/2023   Imaging of gastrointestinal tract abnormal 08/31/2021   Internal hemorrhoids 08/31/2021   Irritable bowel syndrome 08/31/2021   Metabolic syndrome 11/07/2011   Mucus in stool 08/31/2021   Near syncope 07/21/2021   Neuromuscular disorder (HCC)    neuropathy feet   Numbness of toes 04/20/2018   Other general symptoms and signs 08/31/2021   Pancreatitis    Paroxysmal ventricular tachycardia (HCC) 09/08/2021   Perennial allergic rhinitis 12/25/2018   Peripheral neuropathy, idiopathic 10/05/2010   Personal history of prostate cancer 11/07/2022   Pneumonia    Prolapsed internal hemorrhoids, grade 4 11/07/2022   Prostate cancer (HCC)  Radiation proctitis 08/31/2021   Right lower quadrant pain 08/31/2021   Sinus bradycardia 11/07/2011   SECONDARY TO MEDICATIONS     Vitamin D  deficiency 10/05/2010   Allergies: No Known Allergies   Surgical History:  Willmar  has a past surgical history that includes Prostate biopsy (may 2021 and 2020); Nasal endoscopy; colonscopy; Radioactive seed implant (N/A, 01/13/2020); SPACE OAR INSTILLATION (N/A, 01/13/2020); Cystoscopy (N/A, 01/13/2020); Mass excision (Left, 02/08/2021); CORONARY ANGIOGRAPHY (N/A, 09/08/2021); CORONARY STENT INTERVENTION (N/A, 09/08/2021); CORONARY PRESSURE/FFR STUDY (N/A, 09/08/2021); Esophageal manometry (N/A, 04/28/2022); Eye surgery; Xi robotic assisted hiatal hernia repair (N/A, 05/19/2022); Extracorporeal shock wave lithotripsy (Left, 06/10/2022); Cystoscopy/ureteroscopy/holmium laser/stent placement  (Left, 06/15/2022); EUS (N/A, 05/15/2023); biopsy (05/15/2023); Esophagogastroduodenoscopy (egd) with propofol  (N/A, 05/15/2023); Esophagogastroduodenoscopy (egd) with propofol  (N/A, 07/20/2023); EUS (N/A, 07/20/2023); Fine needle aspiration (N/A, 07/20/2023); Coronary angioplasty (08/2021); Esophagogastroduodenoscopy (N/A, 03/05/2024); and Botox  injection (N/A, 03/05/2024). Family History:  Murvin's family history includes Arthritis in Lenord's mother; Breast cancer in Jeronimo's sister; Cancer in Dinh's sister; Diabetes in Derric's maternal grandmother; Early death in Zaiyden's paternal grandfather and paternal grandmother; Hearing loss in Zhyon's paternal grandfather and paternal grandmother; Heart attack in Rowin's paternal uncle; Heart disease in Noeh's father; Hyperlipidemia in Van's father; Hypertension in Gal's father.  REVIEW OF SYSTEMS  : All other systems reviewed and negative except where noted in the History of Present Illness.  PHYSICAL EXAM: BP 130/78   Pulse 87   Ht 6' 2 (1.88 m)   Wt 215 lb (97.5 kg)   BMI 27.60 kg/m  Physical Exam   GENERAL APPEARANCE: Well nourished, in no apparent distress. HEENT: No cervical lymphadenopathy, unremarkable thyroid , sclerae anicteric, conjunctiva pink. RESPIRATORY: Respiratory effort normal, breath sounds equal bilateral without rales, rhonchi, wheezing. CARDIO: Regular rate and rhythm with no murmurs, rubs, or gallops, peripheral pulses intact. ABDOMEN: Soft, non-distended, active bowel sounds in all four quadrants, no tenderness to palpation, no rebound, no mass appreciated, normal on examination. RECTAL: Declines. MUSCULOSKELETAL: Full range of motion, normal gait, without edema. SKIN: Dry, intact without rashes or lesions. No jaundice. NEURO: Alert, oriented, no focal deficits. PSYCH: Cooperative, normal mood and affect.      Alan JONELLE Coombs, PA-C 3:28 PM

## 2024-05-01 NOTE — Patient Instructions (Signed)
 _______________________________________________________  If your blood pressure at your visit was 140/90 or greater, please contact your primary care physician to follow up on this.  _______________________________________________________  If you are age 73 or older, your body mass index should be between 23-30. Your Body mass index is 27.6 kg/m. If this is out of the aforementioned range listed, please consider follow up with your Primary Care Provider.  If you are age 52 or younger, your body mass index should be between 19-25. Your Body mass index is 27.6 kg/m. If this is out of the aformentioned range listed, please consider follow up with your Primary Care Provider.   ________________________________________________________  The [facility] providers would like to encourage you to use MYCHART to communicate with providers for non-urgent requests or questions.  Due to long hold times on the telephone, sending your provider a message by [facility] may be a faster and more efficient way to get a response.  Please allow 48 business hours for a response.  Please remember that this is for non-urgent requests.  _______________________________________________________  [facility] is using a team-based approach to care.  Your team is made up of your doctor and two to three APPS. Our APPS (Nurse Practitioners and Physician Assistants) work with your physician to ensure care continuity for you. They are fully qualified to address your health concerns and develop a treatment plan. They communicate directly with your gastroenterologist to care for you. Seeing the Advanced Practice Practitioners on your physician's team can help you by facilitating care more promptly, often allowing for earlier appointments, access to diagnostic testing, procedures, and other specialty referrals.   Your provider has requested that you have an abdominal x ray before leaving today. Please go to the basement floor to  our Radiology department for the test.  You have been scheduled for an Upper GI Series at [facility]. Your appointment is on 05-09-24 at 10am. Please arrive 30 minutes prior to your test for registration. Make sure not to eat or drink anything after midnight on the night before your test. If you need to reschedule, please call radiology at [phone]. ________________________________________________________________ An upper GI series uses x rays to help diagnose problems of the upper GI tract, which includes the esophagus, stomach, and duodenum. The duodenum is the first part of the small intestine.  An upper GI series is conducted by a radiology technologist or a radiologist--a doctor who specializes in x-ray imaging--at a hospital or outpatient center.  While sitting or standing in front of an x-ray machine, the patient drinks barium liquid, which is often white and has a chalky consistency and taste. The barium liquid coats the lining of the upper GI tract and makes signs of disease show up more clearly on x rays. X-ray video, called fluoroscopy, is used to view the barium liquid moving through the esophagus, stomach, and duodenum.  Additional x rays and fluoroscopy are performed while the patient lies on an x-ray table. To fully coat the upper GI tract with barium liquid, the technologist or radiologist may press on the abdomen or ask the patient to change position. Patients hold still in various positions, allowing the technologist or radiologist to take x rays of the upper GI tract at different angles. If a technologist conducts the upper GI series, a radiologist will later examine the images to look for problems.  This test typically takes about 1 hour to complete. __________________________________________________________________   Dean Guzman have been scheduled for an ultrasound at [facility] on  _________ at _______. Please arrive 30 minutes prior to your appointment for  registration. Make certain not to have anything to eat or drink 6 hours prior to your appointment. Should you need to reschedule your appointment, please contact radiology at [phone]. This test typically takes about 30 minutes to perform.  Due to recent changes in healthcare laws, you may see the results of your imaging and laboratory studies on MyChart before your provider has had a chance to review them.  We understand that in some cases there may be results that are confusing or concerning to you. Not all laboratory results come back in the same time frame and the provider may be waiting for multiple results in order to interpret others.  Please give us  48 hours in order for your provider to thoroughly review all the results before contacting the office for clarification of your results.   You have been scheduled for an appointment with ___________ at [facility]. Your appointment is on _______ at _______. Please arrive at __________ for registration. Make certain to bring a list of current medications, including any over the counter medications or vitamins. Also bring your co-pay if you have one as well as your insurance cards. [facility] is located at [address]. Should you need to reschedule your appointment, please contact them at [phone].

## 2024-05-02 ENCOUNTER — Encounter: Payer: Self-pay | Admitting: Cardiology

## 2024-05-02 ENCOUNTER — Other Ambulatory Visit: Payer: Self-pay

## 2024-05-02 MED ORDER — ROSUVASTATIN CALCIUM 20 MG PO TABS: 20.0000 mg | ORAL_TABLET | Freq: Every day | ORAL | 3 refills | Status: AC

## 2024-05-02 NOTE — Telephone Encounter (Signed)
 Crestor  20mg  #100 ref x 3

## 2024-05-08 ENCOUNTER — Ambulatory Visit: Payer: Self-pay | Admitting: Gastroenterology

## 2024-05-09 ENCOUNTER — Ambulatory Visit (HOSPITAL_COMMUNITY)
Admission: RE | Admit: 2024-05-09 | Discharge: 2024-05-09 | Disposition: A | Discharge status: Home / Self Care | Source: Ambulatory Visit | Attending: Physician Assistant | Admitting: Physician Assistant

## 2024-05-09 ENCOUNTER — Ambulatory Visit (HOSPITAL_COMMUNITY)
Admission: RE | Admit: 2024-05-09 | Discharge: 2024-05-09 | Attending: Physician Assistant | Admitting: Physician Assistant

## 2024-05-09 DIAGNOSIS — R109 Unspecified abdominal pain: Secondary | ICD-10-CM | POA: Diagnosis present

## 2024-05-09 DIAGNOSIS — K3184 Gastroparesis: Secondary | ICD-10-CM | POA: Diagnosis present

## 2024-05-09 DIAGNOSIS — R634 Abnormal weight loss: Secondary | ICD-10-CM | POA: Insufficient documentation

## 2024-05-09 DIAGNOSIS — R1084 Generalized abdominal pain: Secondary | ICD-10-CM | POA: Diagnosis present

## 2024-05-09 NOTE — Progress Notes (Addendum)
°  IR BRIEF PROGRESS NOTE:  Patient presented for ordered UGI w/ small bowel follow through (SBFT). Patient has had a nuclear medicine gastric emptying study on 06/05/2023. Attempted to contact ordering provider for clarification on need for SBFT. Per Radiologist on staff today, Dr. Norleen Kil, the SBFT is low yield and not generally recommended. It will also require up to 4 hours additional time for imaging. However, patient has an appointment with vascular lab @ 11:00. Ordering provider, Ms. Craig, is not available by [employer], and multiple attempts at calling GI office/provider line went unanswered.   A frank discussion was had with patient. With uncertainty about need for low yielding SBFT, and with scheduling conflict looming, SBFT was aborted. UGI portion of study was completed. Please place new order for UGI w/ SBFT if SBFT is still needed.    Electronically Signed: Carlin DELENA Griffon, PA-C 05/09/2024, 10:50 AM

## 2024-05-13 ENCOUNTER — Ambulatory Visit (HOSPITAL_COMMUNITY)
Admission: RE | Admit: 2024-05-13 | Discharge: 2024-05-13 | Attending: Physician Assistant | Admitting: Physician Assistant

## 2024-05-13 DIAGNOSIS — R1084 Generalized abdominal pain: Secondary | ICD-10-CM | POA: Insufficient documentation

## 2024-05-13 DIAGNOSIS — R109 Unspecified abdominal pain: Secondary | ICD-10-CM | POA: Diagnosis present

## 2024-05-14 ENCOUNTER — Other Ambulatory Visit: Payer: Self-pay

## 2024-05-15 ENCOUNTER — Other Ambulatory Visit: Payer: Self-pay

## 2024-05-15 MED ORDER — PRUCALOPRIDE SUCCINATE 2 MG PO TABS: 2.0000 mg | ORAL_TABLET | Freq: Every day | ORAL | 2 refills | Status: AC

## 2024-05-20 ENCOUNTER — Ambulatory Visit (HOSPITAL_COMMUNITY)
Admission: RE | Admit: 2024-05-20 | Discharge: 2024-05-20 | Disposition: A | Discharge status: Home / Self Care | Source: Ambulatory Visit | Attending: Physician Assistant | Admitting: Physician Assistant

## 2024-05-20 DIAGNOSIS — R634 Abnormal weight loss: Secondary | ICD-10-CM | POA: Insufficient documentation

## 2024-05-20 DIAGNOSIS — I2510 Atherosclerotic heart disease of native coronary artery without angina pectoris: Secondary | ICD-10-CM | POA: Diagnosis present

## 2024-05-20 DIAGNOSIS — R1013 Epigastric pain: Secondary | ICD-10-CM | POA: Insufficient documentation

## 2024-05-20 MED ORDER — IOHEXOL 350 MG/ML SOLN
100.0000 mL | Freq: Once | INTRAVENOUS | Status: AC | PRN
  Administered 2024-05-20: 100 mL via INTRAVENOUS

## 2024-06-03 ENCOUNTER — Ambulatory Visit: Payer: Self-pay | Admitting: Physician Assistant

## 2024-12-24 ENCOUNTER — Ambulatory Visit
